# Patient Record
Sex: Male | Born: 1945
Health system: Southern US, Community
[De-identification: ages and names within clinical notes are randomized; demographics above are authoritative.]

## PROBLEM LIST (undated history)

## (undated) DIAGNOSIS — E039 Hypothyroidism, unspecified: Secondary | ICD-10-CM

## (undated) DIAGNOSIS — R011 Cardiac murmur, unspecified: Secondary | ICD-10-CM

## (undated) DIAGNOSIS — E782 Mixed hyperlipidemia: Secondary | ICD-10-CM

## (undated) DIAGNOSIS — T148XXA Other injury of unspecified body region, initial encounter: Secondary | ICD-10-CM

## (undated) DIAGNOSIS — I10 Essential (primary) hypertension: Secondary | ICD-10-CM

## (undated) DIAGNOSIS — M1990 Unspecified osteoarthritis, unspecified site: Secondary | ICD-10-CM

## (undated) HISTORY — PX: KNEE ARTHROSCOPY: SUR90

## (undated) HISTORY — DX: Essential (primary) hypertension: I10

## (undated) HISTORY — PX: CERVICAL DISC SURGERY: SHX588

## (undated) HISTORY — DX: Hypothyroidism, unspecified: E03.9

## (undated) HISTORY — DX: Mixed hyperlipidemia: E78.2

## (undated) HISTORY — PX: APPENDECTOMY: SHX54

---

## 1973-03-25 HISTORY — PX: KNEE SURGERY: SHX244

## 2002-04-08 ENCOUNTER — Ambulatory Visit (HOSPITAL_COMMUNITY): Admission: RE | Admit: 2002-04-08 | Discharge: 2002-04-08 | Payer: Self-pay | Admitting: Family Medicine

## 2002-04-08 ENCOUNTER — Encounter: Payer: Self-pay | Admitting: Family Medicine

## 2003-03-26 HISTORY — PX: OLECRANON BURSECTOMY: SHX2097

## 2003-12-12 ENCOUNTER — Encounter: Payer: Self-pay | Admitting: Orthopedic Surgery

## 2004-03-07 ENCOUNTER — Ambulatory Visit: Payer: Self-pay | Admitting: Orthopedic Surgery

## 2004-04-06 ENCOUNTER — Ambulatory Visit: Payer: Self-pay | Admitting: Orthopedic Surgery

## 2004-04-06 ENCOUNTER — Ambulatory Visit (HOSPITAL_COMMUNITY): Admission: RE | Admit: 2004-04-06 | Discharge: 2004-04-06 | Payer: Self-pay | Admitting: Orthopedic Surgery

## 2004-04-11 ENCOUNTER — Ambulatory Visit: Payer: Self-pay | Admitting: Orthopedic Surgery

## 2004-04-18 ENCOUNTER — Ambulatory Visit: Payer: Self-pay | Admitting: Orthopedic Surgery

## 2005-10-14 ENCOUNTER — Ambulatory Visit: Payer: Self-pay | Admitting: Orthopedic Surgery

## 2006-06-04 ENCOUNTER — Ambulatory Visit: Payer: Self-pay | Admitting: Orthopedic Surgery

## 2006-06-11 ENCOUNTER — Ambulatory Visit: Payer: Self-pay | Admitting: Orthopedic Surgery

## 2006-07-10 ENCOUNTER — Ambulatory Visit: Payer: Self-pay | Admitting: Orthopedic Surgery

## 2006-08-25 ENCOUNTER — Ambulatory Visit: Payer: Self-pay | Admitting: Orthopedic Surgery

## 2006-09-02 ENCOUNTER — Inpatient Hospital Stay (HOSPITAL_COMMUNITY): Admission: RE | Admit: 2006-09-02 | Discharge: 2006-09-05 | Payer: Self-pay | Admitting: Orthopedic Surgery

## 2006-09-02 ENCOUNTER — Ambulatory Visit: Payer: Self-pay | Admitting: Orthopedic Surgery

## 2006-09-02 ENCOUNTER — Encounter: Payer: Self-pay | Admitting: Orthopedic Surgery

## 2006-09-02 HISTORY — PX: TOTAL KNEE ARTHROPLASTY: SHX125

## 2006-09-16 ENCOUNTER — Ambulatory Visit: Payer: Self-pay | Admitting: Orthopedic Surgery

## 2006-09-18 ENCOUNTER — Encounter (HOSPITAL_COMMUNITY): Admission: RE | Admit: 2006-09-18 | Discharge: 2006-10-18 | Payer: Self-pay | Admitting: Orthopedic Surgery

## 2006-09-23 ENCOUNTER — Ambulatory Visit: Payer: Self-pay | Admitting: Orthopedic Surgery

## 2006-10-14 ENCOUNTER — Ambulatory Visit: Payer: Self-pay | Admitting: Orthopedic Surgery

## 2006-10-20 ENCOUNTER — Encounter: Admission: RE | Admit: 2006-10-20 | Discharge: 2006-11-19 | Payer: Self-pay | Admitting: Orthopedic Surgery

## 2006-11-25 ENCOUNTER — Ambulatory Visit: Payer: Self-pay | Admitting: Orthopedic Surgery

## 2007-01-19 ENCOUNTER — Ambulatory Visit: Payer: Self-pay | Admitting: Orthopedic Surgery

## 2007-01-19 DIAGNOSIS — M76899 Other specified enthesopathies of unspecified lower limb, excluding foot: Secondary | ICD-10-CM | POA: Insufficient documentation

## 2007-02-26 ENCOUNTER — Ambulatory Visit: Payer: Self-pay | Admitting: Orthopedic Surgery

## 2007-02-26 DIAGNOSIS — M1710 Unilateral primary osteoarthritis, unspecified knee: Secondary | ICD-10-CM

## 2007-10-13 ENCOUNTER — Encounter: Payer: Self-pay | Admitting: Orthopedic Surgery

## 2007-10-14 ENCOUNTER — Ambulatory Visit: Payer: Self-pay | Admitting: Orthopedic Surgery

## 2007-10-14 DIAGNOSIS — M2350 Chronic instability of knee, unspecified knee: Secondary | ICD-10-CM

## 2008-01-07 ENCOUNTER — Encounter: Payer: Self-pay | Admitting: Orthopedic Surgery

## 2008-09-29 ENCOUNTER — Ambulatory Visit: Payer: Self-pay | Admitting: Orthopedic Surgery

## 2008-09-29 DIAGNOSIS — Z96659 Presence of unspecified artificial knee joint: Secondary | ICD-10-CM

## 2008-12-27 ENCOUNTER — Encounter: Payer: Self-pay | Admitting: Orthopedic Surgery

## 2010-04-03 ENCOUNTER — Encounter: Payer: Self-pay | Admitting: Orthopedic Surgery

## 2010-04-04 ENCOUNTER — Ambulatory Visit
Admission: RE | Admit: 2010-04-04 | Discharge: 2010-04-04 | Payer: Self-pay | Source: Home / Self Care | Attending: Orthopedic Surgery | Admitting: Orthopedic Surgery

## 2010-04-04 DIAGNOSIS — IMO0002 Reserved for concepts with insufficient information to code with codable children: Secondary | ICD-10-CM | POA: Insufficient documentation

## 2010-04-05 ENCOUNTER — Encounter: Payer: Self-pay | Admitting: Orthopedic Surgery

## 2010-04-05 ENCOUNTER — Encounter (INDEPENDENT_AMBULATORY_CARE_PROVIDER_SITE_OTHER): Payer: Self-pay | Admitting: *Deleted

## 2010-04-06 ENCOUNTER — Ambulatory Visit (HOSPITAL_COMMUNITY)
Admission: RE | Admit: 2010-04-06 | Discharge: 2010-04-06 | Payer: Self-pay | Source: Home / Self Care | Attending: Orthopedic Surgery | Admitting: Orthopedic Surgery

## 2010-04-09 ENCOUNTER — Encounter: Payer: Self-pay | Admitting: Orthopedic Surgery

## 2010-04-09 ENCOUNTER — Ambulatory Visit
Admission: RE | Admit: 2010-04-09 | Discharge: 2010-04-09 | Payer: Self-pay | Source: Home / Self Care | Attending: Orthopedic Surgery | Admitting: Orthopedic Surgery

## 2010-04-09 DIAGNOSIS — M2340 Loose body in knee, unspecified knee: Secondary | ICD-10-CM | POA: Insufficient documentation

## 2010-04-09 DIAGNOSIS — M9320 Osteochondritis dissecans of unspecified site: Secondary | ICD-10-CM | POA: Insufficient documentation

## 2010-04-09 DIAGNOSIS — Z9889 Other specified postprocedural states: Secondary | ICD-10-CM | POA: Insufficient documentation

## 2010-04-09 LAB — BASIC METABOLIC PANEL
BUN: 16 mg/dL (ref 6–23)
CO2: 28 mEq/L (ref 19–32)
Calcium: 9 mg/dL (ref 8.4–10.5)
Chloride: 103 mEq/L (ref 96–112)
Creatinine, Ser: 1.15 mg/dL (ref 0.4–1.5)
GFR calc Af Amer: >60 mL/min (ref >60)
GFR calc non Af Amer: >60 mL/min (ref >60)
Glucose, Bld: 98 mg/dL (ref 70–99)
Potassium: 4.2 mEq/L (ref 3.5–5.1)
Sodium: 137 mEq/L (ref 135–145)

## 2010-04-09 LAB — SURGICAL PCR SCREEN
MRSA, PCR: NEGATIVE
Staphylococcus aureus: POSITIVE — AB

## 2010-04-09 LAB — HEMOGLOBIN AND HEMATOCRIT, BLOOD
HCT: 39.1 % (ref 39.0–52.0)
Hemoglobin: 13.5 g/dL (ref 13.0–17.0)

## 2010-04-11 ENCOUNTER — Encounter: Payer: Self-pay | Admitting: Orthopedic Surgery

## 2010-04-12 ENCOUNTER — Ambulatory Visit
Admission: RE | Admit: 2010-04-12 | Discharge: 2010-04-12 | Payer: Self-pay | Source: Home / Self Care | Attending: Orthopedic Surgery | Admitting: Orthopedic Surgery

## 2010-04-19 NOTE — Op Note (Addendum)
  NAME:  Ryan [name], Ryan [name]                ACCOUNT NO.:  [id number]  MEDICAL RECORD NO.:  [id number]          PATIENT TYPE:  AMB  LOCATION:  DAY                           FACILITY:  [facility]  PHYSICIAN:  Vickki Hearing, M.D.DATE OF BIRTH:  29-Nov-1945  DATE OF PROCEDURE:  04/06/2010 DATE OF DISCHARGE:  04/06/2010                              OPERATIVE REPORT   PREOPERATIVE DIAGNOSIS:  Torn medial meniscus of the right knee.  POSTOPERATIVE DIAGNOSES: 1. Torn medial meniscus of the right knee. 2. Osteochondral fracture. 3. Osteoarthritis of the right knee.  PROCEDURES: 1. Arthroscopy, right knee. 2. Removal of loose body. 3. Partial medial meniscectomy. 4. Drilling/microfracture medial femoral condyle.  SURGEON:  Vickki Hearing, MD  ASSISTANTS:  None.  ANESTHESIA:  Spinal.  OPERATIVE FINDINGS:  Large loose body, large osteochondral defect from the loose body in the medial femoral condyle, complex tear, degenerative medial meniscus, and moderate arthritis.  DETAILS OF PROCEDURE:  After site marking, chart update, and antibiotics, the patient was taken to the surgery for spinal anesthetic. His right leg was prepped and draped in sterile technique.  Standard arthroscopy portals were established.  Diagnostic arthroscopy was done by touring throughout the knee.  In the notch area, there was a large loose body and the loose body was found to be coming from the medial femoral condyle on the weightbearing surface.  There was also a large tear of the posterior horn of the medial meniscus, and there was a mild-to-moderate amount of arthritis throughout the knee with associated synovitis.  The meniscal tear was resected with a shaver, duckbill forceps, and balanced with an ArthroCare wand.  Meniscal fragments were removed with a shaver.  After successfully creating a stable rim to the posterior horn of the medial meniscus, a separate portal was used to microfracture the  medial femoral condyle.  The microfracture was performed with chondral picks, however, after this, no bleeding bed was noted, so a Steinmann pin was used to drill several holes into the defect.  The bleeding bed was then noted.  The knee was irrigated and closed with sutures using 3-0 nylon, 1 in each portal.  A 60 mL Marcaine injected into the joint.  The patient was taken to the recovery room in stable condition.     Vickki Hearing, M.D.     SEH/MEDQ  D:  04/09/2010  T:  04/10/2010  Job:  [id number]  Electronically Signed by Fuller Canada M.D. on 04/19/2010 12:45:14 PM

## 2010-04-26 NOTE — Assessment & Plan Note (Signed)
Summary: f/u post op 1 rt knee surg 04/06/10/wkj   Visit Type:  post op Primary Provider:  Dr. Sherwood Gambler  CC:  post op right knee.  History of Present Illness:   65 year old male status post arthroscopy RIGHT knee partial medial meniscectomy and microfracture medial femoral condyle for osteochondral fracture and torn medial meniscus with underlying osteoarthritis, loose body   DOS 04-06-10.  Medciations: Ibuprofen 800 mg.  Complaints: He has no problems.  His knee looks great. He has some bruising on the medial side. He has flexion 120. He is ambulating with the brace on. No support.    Allergies: 1)  ! * Codeine 2)  ! Morphine   Impression & Recommendations:  Problem # 1:  TEAR MEDIAL MENISCUS (ICD-836.0)  he is doing very well. We would recommend that he go ahead and continue his exercise program wear his brace. We'll decide on therapy. When I see him on Thursday to take the stitches out  Orders: Post-Op Check [phone])  Problem # 2:  DEGENERATIVE JOINT DISEASE, KNEE (ICD-715.96)  Orders: Post-Op Check (60454)  Problem # 3:  OSTEOCHONDRITIS DESSICANS (ICD-732.7)  osteochondral fracture  Orders: Post-Op Check (09811)  Problem # 4:  LOOSE BODY-KNEE (ICD-717.6)  Orders: Post-Op Check (91478)  Patient Instructions: 1)  take sutures out thurs  2)  wear brace 12 weeks   Orders Added: 1)  Post-Op Check [29562]

## 2010-04-26 NOTE — Miscellaneous (Signed)
Summary: brace was making medial knee sore  Clinical Lists Changes      Patient came in today said the e hinge brace was making his medial knee really sore, he had swelling, I advised to ice, use ace wrap, pain med and Ibuprofen until seen tomorrow, he walked alot yesterday and had alot of pain last night, brace made the knee pain worse

## 2010-04-26 NOTE — Miscellaneous (Signed)
Summary: No pre-cert required for out-patient procedure  Clinical Lists Changes  Re: out-patient surgery scheduled for 04/06/10 at [facility] RT knee arthroscoopy, no pre-cert required per Medicare guidelines. No pre-cert required for 2ndary BCBS per secondary to Medicare guidelines.

## 2010-04-26 NOTE — Letter (Signed)
Summary: History form  History form   Imported By: Jacklynn Ganong 04/09/2010 14:09:41  _____________________________________________________________________  External Attachment:    Type:   Image     Comment:   External Document

## 2010-04-26 NOTE — Medication Information (Signed)
Summary: Tax adviser   Imported By: Cammie Sickle 04/09/2010 20:02:27  _____________________________________________________________________  External Attachment:    Type:   Image     Comment:   External Document

## 2010-04-26 NOTE — Assessment & Plan Note (Signed)
Summary: right knee pain/needs xray/frs   Vital Signs:  Patient profile:   65 year old male Height:      69 inches Weight:      179 pounds Pulse rate:   64 / minute Resp:     18 per minute  Vitals Entered By: Fuller Canada MD (April 04, 2010 4:09 PM)  Visit Type:  new problem Primary Provider:  Dr. Sherwood Gambler  CC:  right knee pain.  History of Present Illness: I saw Ryan [name] in the office today for a  new problem visit.  He is a 65 years old man with the complaint of:  right knee pain.  Xrays today.  No meds.  4 weeks ago, the patient noticed pain after twisting injury that sent to the floor. Since that time has had a 2nd episode of giving out with burning and 7/10. Constant pain over the medial aspect of his RIGHT knee. He has catching phenomenon as well.  He presents for evaluation  Allergies: 1)  ! * Codeine 2)  ! Morphine  Past History:  Past Surgical History: Last updated: 09/16/2006 CERVICAL DISC Appendectomy Total Knee Arthroplasty-LEFT 09/02/2006  Family History: Last updated: 04/04/2010 FH of Cancer:   Social History: Last updated: 04/04/2010 Patient is married.  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  retired smokes 1 ppd no alcohol 4 cups of coffee per day 10th grade ed  Review of Systems Musculoskeletal:  Complains of joint pain; denies swelling, instability, stiffness, redness, heat, and muscle pain.  The review of systems is negative for Constitutional, Cardiovascular, Respiratory, Gastrointestinal, Genitourinary, Neurologic, Endocrine, Psychiatric, Skin, HEENT, Immunology, and Hemoatologic.  Physical Exam  Skin:  intact without lesions or rashes Cervical Nodes:  no significant adenopathy Psych:  alert and cooperative; normal mood and affect; normal attention span and concentration   Knee  Exam  General:    Well-developed, well-nourished, normal body habitus; no deformities, normal grooming.  Gait:    he has a very noticeable limp on the RIGHT side, favoring the RIGHT leg  Skin:    Intact, no scars, lesions, rashes, cafe au lait spots, or bruising.    Inspection:    swelling with joint effusion, RIGHT knee  Palpation:    tenderness R-medial joint line.    Vascular:    There was no swelling or varicose veins. The pulses and temperature are normal. There was no edema or tenderness.  Sensory:    Gross coordination and sensation were normal.    Motor:    Motor strength 5/5 bilaterally for quadriceps, hamstrings, ankle dorsiflexion, and ankle plantar flexion.    Reflexes:    Normal and symmetric patellar and Achilles reflexes bilaterally.    Knee Exam:    Right:    Inspection:  Abnormal    Palpation:  Abnormal    Stability:  stable    Tenderness:  medial joint line    full range of motion  He has a positive McMurray sign. Positive screw home test.    Left:    Inspection:  Normal    Palpation:  Normal    Stability:  stable    range of motion is normal   Impression & Recommendations:  Problem # 1:  TEAR MEDIAL MENISCUS (ICD-836.0) Assessment New  Separate and Identifiable X-Ray report      x-rays ap lateral  and patellofemoral right knee   FINDINGS: medial joint space narrowing moderate with mild varus   IMPRESSION: abnormal knee with medial joint space narrowing    PLAN: SARK, MEDIAL MENISECTOMY   Orders: Est. Patient Level IV (16109) Knee x-ray,  3 views (60454)  Patient Instructions: 1)  DOS 04/06/10 2)  Preop tomorrow 1/64/11 at 3:15pm [place] [facility], take packet with you. 3)  Post op 1 in our office on 04/09/09   Orders Added: 1)  Est. Patient Level IV [09811] 2)  Knee x-ray,  3 views [73562]

## 2010-04-26 NOTE — Assessment & Plan Note (Signed)
Summary: POST OP 2/SUT REM/RT KNEE SURG 04/06/10/MEDICARE,BCBS/CAF   Visit Type:  Follow-up Primary Provider:  Dr. Sherwood Gambler  CC:  post op 2 rt knee.  History of Present Illness:   65 year old male status post arthroscopy RIGHT knee partial medial meniscectomy and microfracture medial femoral condyle for osteochondral fracture and torn medial meniscus with underlying osteoarthritis, loose body   DOS 04-06-10.  POD 6  Medications: Ibuprofen 800 mg.  much improved, less swelling, he is walking without assist   Stitches out today.  Pain level is 3 today    Allergies: 1)  ! * Codeine 2)  ! Morphine  Physical Exam  Additional Exam:  knee flexion 120, full extension. He does have a lot of bruising and swelling, but we expect this. A small joint effusion, most likely, blood from the drilling.     Impression & Recommendations:  Problem # 1:  ARTHROSCOPY, RIGHT KNEE, HX OF (ICD-V45.89)  Orders: Post-Op Check (16109)  Problem # 2:  LOOSE BODY-KNEE (ICD-717.6)  Orders: Post-Op Check (60454)  Problem # 3:  OSTEOCHONDRITIS DESSICANS (ICD-732.7)  Orders: Post-Op Check (09811)  Problem # 4:  TEAR MEDIAL MENISCUS (ICD-836.0)  Orders: Post-Op Check (91478)  Patient Instructions: 1)  Please schedule a follow-up appointment in 3 weeks.   Orders Added: 1)  Post-Op Check [29562]

## 2010-04-26 NOTE — Letter (Signed)
Summary: surgery order RT knee sched [id number]  surgery order RT knee sched [id number]   Imported By: Cammie Sickle 04/04/2010 21:08:57  _____________________________________________________________________  External Attachment:    Type:   Image     Comment:   External Document

## 2010-05-08 ENCOUNTER — Ambulatory Visit (INDEPENDENT_AMBULATORY_CARE_PROVIDER_SITE_OTHER): Payer: Medicare Other | Admitting: Orthopedic Surgery

## 2010-05-08 ENCOUNTER — Encounter: Payer: Self-pay | Admitting: Orthopedic Surgery

## 2010-05-08 DIAGNOSIS — M9320 Osteochondritis dissecans of unspecified site: Secondary | ICD-10-CM

## 2010-05-08 DIAGNOSIS — Z9889 Other specified postprocedural states: Secondary | ICD-10-CM

## 2010-05-08 DIAGNOSIS — M2340 Loose body in knee, unspecified knee: Secondary | ICD-10-CM

## 2010-05-16 NOTE — Assessment & Plan Note (Signed)
Summary: 3 wk reck rt knee/post op/surg 04/06/10/mcr/caf   Visit Type:  Follow-up Primary Provider:  Dr. Sherwood Gambler  CC:  post op knee.  History of Present Illness:   65 year old male status post arthroscopy RIGHT knee partial medial meniscectomy and microfracture medial femoral condyle for osteochondral fracture and torn medial meniscus with underlying osteoarthritis, loose body  DOS 04-06-10.  POD 4 weeks and 4 days.  Medications: Ibuprofen 800 mg as needed, twice per day at the most, some days he does not take any.  Today is 3 week recheck after HEP.  The economy hinge brace is making the medial knee hurt.  Pain level is 7 today.  Has stiffness, soreness anterior knee, no injury.  He has been weight too active on this microfracture. He couldn't wear the hinge economy brace.  Using complaining of 7/10 pain and some stiffness with swelling          Allergies: 1)  ! * Codeine 2)  ! Morphine  Physical Exam  Additional Exam:  RIGHT knee joint effusion noted  Its small. He has some medial condylar  tenderness and some tenderness at the joint line   ROM = 120 degrees   stable     Impression & Recommendations:  Problem # 1:  ARTHROSCOPY, RIGHT KNEE, HX OF (ICD-V45.89)  Orders: Post-Op Check (91478)  Problem # 2:  LOOSE BODY-KNEE (ICD-717.6)  Orders: Post-Op Check (29562)  Problem # 3:  OSTEOCHONDRITIS DESSICANS (ICD-732.7)  Orders: Post-Op Check (13086)  Patient Instructions: 1)  wear sleeve  2)  ice every night if swollen 3)  continue IBUPROFEN as needed  4)  return in 1 month    Orders Added: 1)  Post-Op Check [57846]

## 2010-06-06 ENCOUNTER — Ambulatory Visit (INDEPENDENT_AMBULATORY_CARE_PROVIDER_SITE_OTHER): Payer: Medicare Other | Admitting: Orthopedic Surgery

## 2010-06-06 ENCOUNTER — Ambulatory Visit: Payer: Medicare Other | Admitting: Orthopedic Surgery

## 2010-06-06 ENCOUNTER — Encounter: Payer: Self-pay | Admitting: Orthopedic Surgery

## 2010-06-06 DIAGNOSIS — M2340 Loose body in knee, unspecified knee: Secondary | ICD-10-CM

## 2010-06-06 DIAGNOSIS — M25469 Effusion, unspecified knee: Secondary | ICD-10-CM | POA: Insufficient documentation

## 2010-06-06 DIAGNOSIS — Z9889 Other specified postprocedural states: Secondary | ICD-10-CM

## 2010-06-06 DIAGNOSIS — M9320 Osteochondritis dissecans of unspecified site: Secondary | ICD-10-CM

## 2010-06-06 DIAGNOSIS — IMO0002 Reserved for concepts with insufficient information to code with codable children: Secondary | ICD-10-CM

## 2010-06-12 NOTE — Assessment & Plan Note (Signed)
Summary: 1 M RE-CK/RT KNEE/POST OP/SURG 04/06/10/MEDICARE/CAF   Visit Type:  post op Primary Provider:  Dr. Sherwood Gambler  CC:  right knee.  History of Present Illness:  65 year old male status post arthroscopy RIGHT knee partial medial meniscectomy and microfracture medial femoral condyle for osteochondral fracture and torn medial meniscus with underlying osteoarthritis, loose body  DOS 04-06-10.  Medications: Ibuprofen 800 mg as needed, twice per day at the most, some days he does not take any.  Complaints: he says that his knee bothers him going up and down steps, it feels spongy.  This is postoperative number 9  He still has swelling and is still limping.  However he's been somewhat noncompliant with the microfracture protocol.  His knee has mild varus alignment.  He has a large joint effusion today.  He has tenderness on the medial compartment.  I aspirated his knee approximately 20 cc of yellow fluid.  I recommended that he rest his knee stay off of it for the next 5 days use ice I put him in a hinged wrap around brace and he will come back in 4 weeks         Allergies: 1)  ! * Codeine 2)  ! Morphine   Impression & Recommendations:  Problem # 1:  ARTHROSCOPY, RIGHT KNEE, HX OF (ICD-V45.89)  consent for RIGHT knee aspiration/injection  Lateral approach used sterile technique after chloride for anesthesia 18-gauge needle aspirated 20 cc of yellow fluid  No complications sterile bandage applied  Verbal consent was obtained. The RIGHT knee was prepped with alcohol and ethyl chloride. 1 cc of depomedrol 40mg /cc and 4 cc of lidocaine 1% was injected. there were no complications.  Orders: Post-Op Check [phone])  Problem # 2:  LOOSE BODY-KNEE (ICD-717.6)  Orders: Post-Op Check (98119)  Problem # 3:  OSTEOCHONDRITIS DESSICANS (ICD-732.7)  Orders: Post-Op Check (14782)  Other Orders: Joint Aspirate / Injection, Large (20610) Depo- Medrol 40mg  (J1030)  Patient  Instructions: 1)  You have received an injection of cortisone today. You may experience increased pain at the injection site. Apply ice pack to the area for 20 minutes every 2 hours and take 2 xtra strength tylenol every 8 hours. This increased pain will usually resolve in 24 hours. The injection will take effect in 3-10 days.  2)  5 days rest  3)  brace  4)  f/u thurs April 12    Orders Added: 1)  Post-Op Check [99024] 2)  Joint Aspirate / Injection, Large [20610] 3)  Depo- Medrol 40mg  [J1030]

## 2010-07-05 ENCOUNTER — Encounter: Payer: Self-pay | Admitting: Orthopedic Surgery

## 2010-07-05 ENCOUNTER — Ambulatory Visit: Payer: Medicare Other | Admitting: Orthopedic Surgery

## 2010-07-12 ENCOUNTER — Ambulatory Visit (INDEPENDENT_AMBULATORY_CARE_PROVIDER_SITE_OTHER): Payer: Medicare Other | Admitting: Orthopedic Surgery

## 2010-07-12 DIAGNOSIS — M2340 Loose body in knee, unspecified knee: Secondary | ICD-10-CM

## 2010-07-12 DIAGNOSIS — M1710 Unilateral primary osteoarthritis, unspecified knee: Secondary | ICD-10-CM

## 2010-07-12 NOTE — Progress Notes (Signed)
Postop visit Mrs. Last visit he forgot his appointment.  Procedure arthroscopy, RIGHT knee, partial medial meniscectomy and microfracture, medial femoral condyle for osteochondral fracture, and torn medial meniscus, and loose body.  Date of surgery January 13  Patient reports his knee is doing well having no difficulties.  Exam shows full extension of the knee with adequate knee flexion, and no effusion.  Impression doing well stable followup as needed for RIGHT knee.  Followup in June for LEFT knee. X-ray annual followup

## 2010-08-07 NOTE — Op Note (Signed)
NAME:  Ryan [name], Ryan [name]                ACCOUNT NO.:  [id number]   MEDICAL RECORD NO.:  [id number]          PATIENT TYPE:  INP   LOCATION:  A331                          FACILITY:  [facility]   PHYSICIAN:  Vickki Hearing, M.D.DATE OF BIRTH:  02/19/1946   DATE OF PROCEDURE:  09/02/2006  DATE OF DISCHARGE:                               OPERATIVE REPORT   HISTORY:  A 65 year old male who had an injury to his left knee several  years ago, perhaps 25.  He had surgery, developed staph infection, had  incision, drainage, antibiotics and over the years has developed medial  arthrosis which has become painful and disabling for him.  He had  conservative treatment, failed that and presented for knee replacement.   PREOPERATIVE DIAGNOSIS:  Osteoarthritis left knee.   POSTOPERATIVE DIAGNOSIS:  Osteoarthritis left knee.   PROCEDURE:  Left total knee with intraoperative cultures.   IMPLANTS USED:  Stryker Triathlon size 5 PS femur, size 6 tibial  baseplate, a size 6, 11 mm tibial insert, size 29 offset dome 9 mm thick  patella.  Tobramycin-laden, Simplex-P cement.   We inserted a pain pump catheter in the subcu tissue.  We injected 30 mL  of Sensorcaine in the soft tissues before closure.   SURGEON:  Fuller Canada, M.D.   ASSISTED BY:  [facility] and [facility].   ANESTHETIC:  Spinal.   OPERATIVE FINDINGS:  Severe deformity and degeneration of the medial  compartment, both femoral and tibial sides, patellofemoral disease, mild  lateral compartment disease, remnant of medial meniscus present.  ACL  appeared degenerated and for all intent and purposes, nonfunctional.  PCL was intact.   PROCEDURE:  The patient was identified and the preop area.  I marked his  left knee for the surgical site as he had done and updated his history  and physical.  He was taken to the operating room for spinal anesthetic  and his antibiotic was started.  After sterile prep and drape of his  left knee, we  completed the time-out procedure.  We confirmed that as a  left total knee replacement, [name], and antibiotic, time started,  etc.   A straight incision was made in the previously noted incision.  This was  extended proximally and distally to normal tissue and then full  thickness medial and lateral flaps were established.  A medial  arthrotomy was performed.  Patella was everted meniscal remnant was  removed.  ACL, PCL removed.  Lateral meniscus removed.  Medial  osteophytes were removed.  Patellofemoral osteophytes removed.   The tibia was cut first.  It was set for neutral cut, 0 degrees slope  and the Triathlon guide was set for 15 mm referencing from the tibial  spine nadir. This removed an symmetric cut as noted on preoperative  planning films.  We then measured the tibia to be a size 6 or 7.   Intramedullary drill was introduced into the femoral canal followed by a  guide rod and distal cutting was set for a 10 mm cut.  We made the 10 mm  cut, checked  the extension gap with a block.  I thought it was too  tight.  We took an additional 2 mm.  We made the four femoral cuts,  checked the flexion gap, found it to be good with an 11, as well as the  extension gap to be good with 11.  We made the box cut, did a femoral  and tibial trial, marked tibial rotation, punched the tibia and cut the  patella from a 24 to a 15.   We then irrigated the joint and bone surfaces, cleaned them, dried them,  cemented our implants in place.  Trialed with a 9 and an 11.  The 11  gave the best fit in terms of motion, collateral ligament stability in  flexion and extension.  On the table, we were able to obtain full  extension and 130 degrees of knee flexion.   The size 6, 11 mm polyethylene tray was inserted, secured, checked.  All  cement and that was in excess was removed.  We injected 30 mL of  Sensorcaine in the soft tissue, closed with interrupted #1 Bralon  sutures and 2-0 and 0 Monocryl  sutures over a pain pump which was placed  in the subcu tissue.  Staples were used to close the skin.  Sterile  dressing, Ace bandage from toe to groin and cryo cuff were applied.  The  patient was taken to the recovery room in stable condition where he had  x-rays which showed excellent alignment and position of the implants.  He was placed in a knee immobilizer and eventually taken up to the  floor.      Vickki Hearing, M.D.  Electronically Signed     SEH/MEDQ  D:  09/02/2006  T:  09/03/2006  Job:  [id number]

## 2010-08-07 NOTE — H&P (Signed)
NAME:  Ryan [name], Ryan [name]                ACCOUNT NO.:  [id number]   MEDICAL RECORD NO.:  [id number]           PATIENT TYPE:  AMB   LOCATION:                                FACILITY:  [facility]   PHYSICIAN:  Vickki Hearing, M.D.DATE OF BIRTH:  1945/12/17   DATE OF ADMISSION:  DATE OF DISCHARGE:  LH                              HISTORY & PHYSICAL   CHIEF COMPLAINT:  Left knee pain.   HISTORY:  This is a 65 year old male status post surgical repair of  right knee after an injury approximately 30 years ago.  After surgery,  the condition was complicated by staph infection.  This was treated with  the incision, drainage and antibiotics.  He recovered and did well until  the last year or so.  He presented in March 2008 complaining of pain and  large joint effusion and swelling. I treated him with aspiration and  injection, culture, fluid analysis and cell count.  The results of this  analysis came back inflammatory cells.  His x-ray showed severe varus  gonarthrosis.  I recommended Indocin and a hinged knee brace and this  initially helped his symptoms but they eventually worsened over time and  we discussed total knee replacement.   He was brought back on Monday, August 25, 2006, with his wife and the  informed consent process was completed.  He had previously been given a  total knee pamphlet.  He and his wife reviewed it and confirmed that.  We had a question and answer session.  Amongst various things we  discussed were antibiotic cement, its use, its complications, risk  factors, we discussed the risks and benefits of using it versus not  using it and I strongly advised that we use it.  We discussed other  things such as infection, DVT, etc.   He consented for the left total knee replacement to be done September 02, 2006, with a follow-up scheduled for September 16, 2006.  They plan to take  the patient home.   It should be noted that he has had severe narcotic reactions with  codeine and  morphine causing him severe flushing and  respiratory  depression.  This has happened on two separate occasions including oral  hydrocodone.  So most likely we will have to use Darvocet p.o., Demerol  or Stadol IV postoperatively.   PREVIOUS SURGICAL HISTORY:  1. A left elbow surgery.  2. Left knee surgery.  3. Cervical disk procedure.  4. Appendectomy.  5. I removed the mass from his left elbow in December 2005.   FAMILY HISTORY:  He has a family history of cancer.   MEDICATIONS:  Currently takes no major medications.   REVIEW OF SYSTEMS:  Negative.   PHYSICAL EXAMINATION:  GENERAL APPEARANCE:  He is well-groomed,  excellent hygiene, no obvious deformities, he developed normally.  Nutritional status is excellent.  His frame his ectomorphic.  HEENT:  He has an upper denture.  His mouth and oral cavity is otherwise  clear.  Head and neck showed no abnormalities.  Pulses are good in the  carotids.  He has no lymphadenopathy.  CHEST:  His chest was clear.  CARDIAC:  Heart rate and rhythm were normal.  ABDOMEN:  His abdomen was soft with no mass for bruit.  EXTREMITIES:  His upper extremity showed good strength without  deformity.  No joint contracture.  Scar noted over the elbow.  With the  exception of the left elbow which shows some lack of extension  approximately 15 degrees.   Left knee and lower extremity exam shows 3-120 in terms of motion.  There is a large medial scar which we will incorporate into the surgical  incision.  There is no instability to the knee.  There is varus  malalignment.  NEUROVASCULAR:  Exam is normal.  Reflexes are normal.  Muscle strength  and tone is normal as well.   IMPRESSION:  Osteoarthritis left knee.   PLAN:  Left total knee arthroplasty plus or minus computer assistance  with antibiotic cement.   DIAGNOSIS:  Osteoarthritis left knee.      Vickki Hearing, M.D.  Electronically Signed     SEH/MEDQ  D:  08/25/2006  T:   08/25/2006  Job:  [id number]

## 2010-08-07 NOTE — Group Therapy Note (Signed)
NAME:  Ryan [name], CLOCK                ACCOUNT NO.:  [id number]   MEDICAL RECORD NO.:  [id number]          PATIENT TYPE:  INP   LOCATION:  A331                          FACILITY:  [facility]   PHYSICIAN:  Vickki Hearing, M.D.DATE OF BIRTH:  [birth year]   DATE OF PROCEDURE:  09/03/2006  DATE OF DISCHARGE:                                 PROGRESS NOTE   The patient is postop day 20from left total knee arthroplasty for  osteoarthritis.  He is afebrile.  His vital signs are stable.  His Ins  and outs net balance of 441 positive.  He is finishing up his IV Ancef  per protocol.  His hemoglobin is 11.6.  His PT/INR after 10 mg of  Coumadin is 13.7 and 1.  Will give him 5 mg today.  Glucose 122, other b-  met values are normal.  His pain level is graded at zero.  He will start  his rehab today.      Vickki Hearing, M.D.  Electronically Signed     SEH/MEDQ  D:  09/03/2006  T:  09/03/2006  Job:  [id number]

## 2010-08-07 NOTE — Discharge Summary (Signed)
NAME:  TREVYON, SWOR NO.:  [id number]   MEDICAL RECORD NO.:  [id number]          PATIENT TYPE:  INP   LOCATION:  A331                          FACILITY:  [facility]   PHYSICIAN:  Vickki Hearing, M.D.DATE OF BIRTH:  Nov 14, 1945   DATE OF ADMISSION:  09/02/2006  DATE OF DISCHARGE:  06/13/2008LH                               DISCHARGE SUMMARY   ADMITTING PHYSICIAN:  Vickki Hearing, M.D.   DISCHARGE PHYSICIAN:  Vickki Hearing, M.D.   ADMITTING DIAGNOSIS:  Osteoarthritis left knee.   DISCHARGE DIAGNOSIS:  Osteoarthritis left knee.   OPERATIVE PROCEDURE:  Left total knee arthroplasty.   DATE OF SURGERY:  09/02/2006.   OPERATIVE FINDINGS:  Severe osteoarthritis left knee.   HISTORY:  The patient is 65 years old.  He injured his knee 30 years  ago, had surgical repair and developed infection with staph.  This was  treated with incision drainage and he did well.  Over the last 10 years  or so he had increasing pain in his knee and finally came to end-stage  arthritis with recurrent effusions and swelling.  We aspirated the knee,  sent it for culture fluid analysis and cell count.  Cell count and  analysis came back inflammatory cells with no infection on culture.  His  x-ray showed varus gonarthrosis.  He was treated with Indocin a hinged  knee brace and over time this failed to control his symptoms.  He  presented for knee replacement surgery.   HOSPITAL COURSE:  The patient admitted on 09/02/2006, had uncomplicated  total knee replacement with a Triathlon posterior stabilized implant and  an X3 tibial bearing size 6, thickness 11 mm.  He had a size 29, 9-mm  patella, size 6 tibial base plate, size 5 posterior stabilized femoral  implant.  He tolerated the procedure well.  No complications.   He was doing well in therapy and was allowed to be discharged home.   His discharging hemoglobin was 10.8.  His INR was 2.6.   DISCHARGE MEDICATIONS:  1.  Coumadin 5 mg daily.  2. Colace 100 mg twice daily.  3. Feosol one twice daily.  4. Darvocet-N100 one q 4 p.r.n. for pain.   We arranged the staples to come out on postop day 10.  He was arranged  to have physical therapy at home with [facility].  He was to  follow with me two weeks after surgery.      Vickki Hearing, M.D.  Electronically Signed     SEH/MEDQ  D:  09/26/2006  T:  09/26/2006  Job:  [id number]

## 2010-08-07 NOTE — Group Therapy Note (Signed)
NAME:  Ryan [name], Ryan [name]                ACCOUNT NO.:  [id number]   MEDICAL RECORD NO.:  [id number]          PATIENT TYPE:  INP   LOCATION:  A331                          FACILITY:  [facility]   PHYSICIAN:  Vickki Hearing, M.D.DATE OF BIRTH:  1946/02/27   DATE OF PROCEDURE:  09/05/2006  DATE OF DISCHARGE:                                 PROGRESS NOTE   He is postoperative day #3 from a left total knee.  He has progressed  well in therapy.  He can be discharged today on Coumadin 5 mg daily.  We  are waiting for his PT/INR, hemoglobin for today.  His intraoperative  cultures from surgery were negative (he had a remote infection 25 years  ago).      Vickki Hearing, M.D.  Electronically Signed     SEH/MEDQ  D:  09/05/2006  T:  09/05/2006  Job:  [id number]

## 2010-08-10 NOTE — H&P (Signed)
NAME:  Ryan [name], LIBY NO.:  [id number]   MEDICAL RECORD NO.:  [id number]            PATIENT TYPE:   LOCATION:                                 FACILITY:   PHYSICIAN:  Vickki Hearing, M.D.DATE OF BIRTH:  1946/03/20   DATE OF ADMISSION:  DATE OF DISCHARGE:  LH                                HISTORY & PHYSICAL   CHIEF COMPLAINT:  Mass, left elbow.   HISTORY:  A 65 year old male has a mass on the left elbow consistent with a  spur and bursitis.  It was aspirated.  It recurred, and he now wishes to  have it taken off.   SURGICAL HISTORY:  1.  Left elbow surgery 30 years ago.  2.  Left knee surgery.  3.  Cervical disk surgery.  4.  Appendectomy.   Family history of cancer.   Allergies to CODEINE and MORPHINE.   MEDICATIONS:  Currently none.   ORTHOPEDIC PHYSICAL EXAMINATION:  VITAL SIGNS:  Weight 178.  Pulse 64,  respiratory rate 18.  GENERAL:  Grooming and hygiene normal, no deformities.  Developed normally.  Nutrition normal.  Frame small.  CARDIOVASCULAR:  Pulses are intact.  MUSCULOSKELETAL:  He has full elbow flexion but lacks 20 degrees of  extension.  His pronation and supination are relatively normal.  There is  crepitation in the elbow.  There are no strength deficits or instability.  Skin over the olecranon and just distal to it, there is a large bursal sac  there.  He has no neurologic deficit.  His pulses are intact.  There are  scars over the elbow from surgery.   The radiographs show a fibrous union of a previous olecranon fracture.  There are multiple bone spurs and soft tissue swelling.  There is a bursal  sac.   IMPRESSION:  Left elbow recurrent bursitis.   Recommend excision of left elbow mass.     Weyman Croon   SEH/MEDQ  D:  03/08/2004  T:  03/08/2004  Job:  [id number]   cc:   [facility]  Fax: [phone]

## 2010-08-10 NOTE — Op Note (Signed)
NAME:  Ryan [name], Ryan [name]                ACCOUNT NO.:  [id number]   MEDICAL RECORD NO.:  [id number]          PATIENT TYPE:  AMB   LOCATION:  DAY                           FACILITY:  [facility]   PHYSICIAN:  Vickki Hearing, M.D.DATE OF BIRTH:  05-23-1945   DATE OF PROCEDURE:  DATE OF DISCHARGE:                                 OPERATIVE REPORT   PREOPERATIVE DIAGNOSIS:  Olecranon bursitis, left elbow.   POSTOPERATIVE DIAGNOSIS:  Olecranon bursitis, left elbow.   OPERATION PERFORMED:  Removal of olecranon bursa and spurs from the left  elbow.   SURGEON:  Vickki Hearing, M.D.   ANESTHESIA:  General.   OPERATIVE FINDINGS:  There was a fibrous union of his olecranon.  There was  a large bursal sac and there was a spur at the triceps insertion.  There was  motion at the fibrous union site from a previous surgery done over 20 years  ago.   COMPLICATIONS:  None.   COUNTS:  Correct.   INDICATIONS FOR PROCEDURE:  Pain and swelling.   DESCRIPTION OF PROCEDURE:  Danna Casella was identified in the preop holding  area.  He was noted to mark his left elbow as the surgical site and I signed  my initials in the same area.  He was given preoperative antibiotics and  taken to the operating room for general anesthesia.  After satisfactory  anesthesia, time out was taken and the surgical site, patient's identity and  procedure were confirmed.   An incision was made over the olecranon bursa.  This was excised.  At this  point we removed several spurs.  It was noted that there was gross motion at  the fibrous unit site and this was not taken down or bothered with in any  fashion.  The wound was closed including using 2-0 Ethibond to close the  triceps split and 2-0 Monocryl was used to close the skin.  A Hemovac drain  was placed and a staple suture was used to close the wound.  Sterile  dressings were applied.  The patient was extubated and taken to recovery  room in stable condition.     Weyman Croon  SEH/MEDQ  D:  04/07/2004  T:  04/07/2004  Job:  [id number]

## 2010-09-05 ENCOUNTER — Ambulatory Visit: Payer: Medicare Other | Admitting: Orthopedic Surgery

## 2010-09-12 ENCOUNTER — Ambulatory Visit: Payer: Self-pay | Admitting: Orthopedic Surgery

## 2010-10-17 ENCOUNTER — Ambulatory Visit (INDEPENDENT_AMBULATORY_CARE_PROVIDER_SITE_OTHER): Payer: Medicare Other | Admitting: Orthopedic Surgery

## 2010-10-17 DIAGNOSIS — Z96659 Presence of unspecified artificial knee joint: Secondary | ICD-10-CM

## 2010-10-17 NOTE — Progress Notes (Signed)
Annual follow-up LEFT total knee.  Date of surgery. June 10  Diagnosis OA knee  Complaints: No complaints  Exam. GENERAL: normal development   CDV: pulses are normal   Skin: normal  Lymph: deferred  Psychiatric: awake, alert and oriented  Neuro: normal sensation  MSK  Knee flexion, 125  X-ray:Separately identifiable. X-ray report.  Total knee replacement annual x-ray.  All 3 components are properly aligned. Overall knee alignment is normal. No signs of loosening.  Impression no complicating findings and his postoperative knee replacement film   Plan F/U in 1 year for repeat films

## 2011-01-10 LAB — CROSSMATCH
ABO/RH(D): A POS
Antibody Screen: NEGATIVE

## 2011-01-10 LAB — DIFFERENTIAL
Basophils Absolute: 0
Basophils Absolute: 0
Basophils Absolute: 0
Basophils Relative: 0
Basophils Relative: 0
Basophils Relative: 0
Eosinophils Absolute: 0
Eosinophils Absolute: 0
Eosinophils Absolute: 0.1
Eosinophils Relative: 0
Eosinophils Relative: 0
Eosinophils Relative: 0
Lymphocytes Relative: 10 — ABNORMAL LOW
Lymphocytes Relative: 8 — ABNORMAL LOW
Lymphocytes Relative: 8 — ABNORMAL LOW
Lymphs Abs: 1.2
Lymphs Abs: 1.3
Lymphs Abs: 1.3
Monocytes Absolute: 1.7 — ABNORMAL HIGH
Monocytes Absolute: 1.9 — ABNORMAL HIGH
Monocytes Absolute: 2.2 — ABNORMAL HIGH
Monocytes Relative: 12 — ABNORMAL HIGH
Monocytes Relative: 14 — ABNORMAL HIGH
Monocytes Relative: 15 — ABNORMAL HIGH
Neutro Abs: 11.1 — ABNORMAL HIGH
Neutro Abs: 12.6 — ABNORMAL HIGH
Neutro Abs: 9.1 — ABNORMAL HIGH
Neutrophils Relative %: 75
Neutrophils Relative %: 77
Neutrophils Relative %: 79 — ABNORMAL HIGH

## 2011-01-10 LAB — BASIC METABOLIC PANEL
BUN: 10
BUN: 10
BUN: 10
BUN: 13
CO2: 25
CO2: 26
CO2: 28
CO2: 29
Calcium: 8.3 — ABNORMAL LOW
Calcium: 8.5
Calcium: 8.5
Calcium: 9.2
Chloride: 103
Chloride: 103
Chloride: 104
Chloride: 99
Creatinine, Ser: 1.11
Creatinine, Ser: 1.14
Creatinine, Ser: 1.15
Creatinine, Ser: 1.16
GFR calc Af Amer: >60
GFR calc Af Amer: >60
GFR calc Af Amer: >60
GFR calc Af Amer: >60
GFR calc non Af Amer: >60
GFR calc non Af Amer: >60
GFR calc non Af Amer: >60
GFR calc non Af Amer: >60
Glucose, Bld: 108 — ABNORMAL HIGH
Glucose, Bld: 115 — ABNORMAL HIGH
Glucose, Bld: 122 — ABNORMAL HIGH
Glucose, Bld: 89
Potassium: 4.1
Potassium: 4.5
Potassium: 4.5
Potassium: 4.6
Sodium: 132 — ABNORMAL LOW
Sodium: 136
Sodium: 137
Sodium: 137

## 2011-01-10 LAB — CBC
HCT: 30.3 — ABNORMAL LOW
HCT: 30.7 — ABNORMAL LOW
HCT: 33.1 — ABNORMAL LOW
HCT: 41
Hemoglobin: 10.8 — ABNORMAL LOW
Hemoglobin: 10.9 — ABNORMAL LOW
Hemoglobin: 11.6 — ABNORMAL LOW
Hemoglobin: 14.3
MCHC: 34.8
MCHC: 34.9
MCHC: 35.5
MCHC: 35.5
MCV: 88.7
MCV: 88.9
MCV: 89.2
MCV: 89.5
Platelets: 198
Platelets: 213
Platelets: 217
Platelets: 252
RBC: 3.4 — ABNORMAL LOW
RBC: 3.46 — ABNORMAL LOW
RBC: 3.7 — ABNORMAL LOW
RBC: 4.61
RDW: 12.7
RDW: 12.8
RDW: 12.9
RDW: 12.9
WBC: 12.2 — ABNORMAL HIGH
WBC: 14.6 — ABNORMAL HIGH
WBC: 15.9 — ABNORMAL HIGH
WBC: 8.5

## 2011-01-10 LAB — PROTIME-INR
INR: 0.9
INR: 1
INR: 1.6 — ABNORMAL HIGH
INR: 2.6 — ABNORMAL HIGH
Prothrombin Time: 12.2
Prothrombin Time: 13.7
Prothrombin Time: 19.4 — ABNORMAL HIGH
Prothrombin Time: 29 — ABNORMAL HIGH

## 2011-01-10 LAB — APTT: aPTT: 30

## 2011-01-10 LAB — ANAEROBIC CULTURE: Gram Stain: NONE SEEN

## 2011-01-10 LAB — WOUND CULTURE: Culture: NO GROWTH

## 2011-01-10 LAB — ABO/RH: ABO/RH(D): A POS

## 2011-05-02 ENCOUNTER — Encounter: Payer: Self-pay | Admitting: Orthopedic Surgery

## 2011-05-06 ENCOUNTER — Ambulatory Visit (INDEPENDENT_AMBULATORY_CARE_PROVIDER_SITE_OTHER): Payer: Medicare Other | Admitting: Orthopedic Surgery

## 2011-05-06 ENCOUNTER — Encounter: Payer: Self-pay | Admitting: Orthopedic Surgery

## 2011-05-06 VITALS — BP 148/92 | Ht 68.0 in | Wt 182.0 lb

## 2011-05-06 DIAGNOSIS — M7020 Olecranon bursitis, unspecified elbow: Secondary | ICD-10-CM | POA: Insufficient documentation

## 2011-05-06 NOTE — Patient Instructions (Addendum)
Surgery March 8 th left elbow bursectomy   This procedure has been fully reviewed

## 2011-05-06 NOTE — Progress Notes (Signed)
AP lateral LEFT elbow  LEFT elbow swelling and pain  There is a fibrous union of the olecranon.  The multiple cysts in the olecranon as well as the ulna.  The ulnohumeral joint has arthritis.  There is a loose body in the anterior capsule  Impression degenerative arthritis ulnohumeral joint with loose body in the anterior capsule and fibrous union of olecranon.

## 2011-05-06 NOTE — Progress Notes (Signed)
  Subjective:    Ryan [name] is a 66 y.o. male who presents with recurrent swelling and pain over the LEFT olecranon bursa.  Back in 2006 he had excision of olecranon spur with olecranon bursectomy and was noted at the time to have a fibrous union of his olecranon from her fracture he sustained many many years ago which included a both bone forearm fracture which was treated without internal fixation area  He presents now again with swelling and pain in his LEFT olecranon bursa which is waking him up causing him to have difficulty with his activities of daily living and he would like it removed  We did offer him an aspiration injection he declined.   The following portions of the patient's history were reviewed and updated as appropriate: allergies, current medications, past family history, past medical history, past social history, past surgical history and problem list.  Review of Systems A comprehensive review of systems was negative.   Objective:    BP 148/92  Ht 5\' 8"  (1.727 m)  Wt 182 lb (82.555 kg)  BMI 27.67 kg/m2 Right elbow: without deformity and full active ROM  Left elbow:  swelling present, full active ROM, olecranon bursa present and the bursal area is tender and fluid filled.  Surprisingly he does have full range of motion.  There is some crepitance there but no tenderness over the fibrous union at the olecranon., the ligaments are stable he has excellent flexion and extension power   X-ray left elbow: no fracture, dislocation, swelling or degenerative changes noted and the most striking feature of the x-rays the fibrous union of the olecranon.  Degenerative changes of the ulnohumeral joint and the radiocapitellar joint there is an osteophyte or loose body in the anterior joint capsule and there is significant cyst formation and osteopenia at the olecranon and its corresponding portion of the ulna.   Assessment:    left elbow bursitis    Plan:    excision of olecranon  bursa LEFT elbow

## 2011-05-15 ENCOUNTER — Other Ambulatory Visit: Payer: Self-pay | Admitting: *Deleted

## 2011-05-24 ENCOUNTER — Ambulatory Visit (HOSPITAL_COMMUNITY): Payer: Medicare Other

## 2011-05-24 ENCOUNTER — Encounter (HOSPITAL_COMMUNITY): Payer: Self-pay | Admitting: Pharmacy Technician

## 2011-05-27 ENCOUNTER — Encounter (HOSPITAL_COMMUNITY)
Admission: RE | Admit: 2011-05-27 | Discharge: 2011-05-27 | Disposition: A | Discharge status: Home / Self Care | Payer: Medicare Other | Source: Ambulatory Visit | Attending: Orthopedic Surgery | Admitting: Orthopedic Surgery

## 2011-05-27 ENCOUNTER — Encounter (HOSPITAL_COMMUNITY): Payer: Self-pay

## 2011-05-27 ENCOUNTER — Other Ambulatory Visit: Payer: Self-pay

## 2011-05-27 LAB — HEMOGLOBIN AND HEMATOCRIT, BLOOD: HCT: 42.9 % (ref 39.0–52.0)

## 2011-05-27 MED ORDER — CHLORHEXIDINE GLUCONATE 4 % EX LIQD
60.0000 mL | Freq: Once | CUTANEOUS | Status: DC
  Filled 2011-05-27: qty 60

## 2011-05-27 NOTE — Patient Instructions (Signed)
[name]  05/27/2011   Your procedure is scheduled on:  05/31/11  Report to [facility] at 06:15 AM.  Call this number if you have problems the morning of surgery: [phone]   Remember:   Do not eat food:After Midnight.  May have clear liquids:until Midnight .  Clear liquids include soda, tea, black coffee, apple or grape juice, broth.  Take these medicines the morning of surgery with A SIP OF WATER: None   Do not wear jewelry, make-up or nail polish.  Do not wear lotions, powders, or perfumes. You may wear deodorant.  Do not shave 48 hours prior to surgery.  Do not bring valuables to the hospital.  Contacts, dentures or bridgework may not be worn into surgery.  Leave suitcase in the car. After surgery it may be brought to your room.  For patients admitted to the hospital, checkout time is 11:00 AM the day of discharge.   Patients discharged the day of surgery will not be allowed to drive home.  Name and phone number of your driver:   Special Instructions: CHG Shower Use Special Wash: 1/2 bottle night before surgery and 1/2 bottle morning of surgery.   Please read over the following fact sheets that you were given: Pain Booklet, MRSA Information, Surgical Site Infection Prevention, Anesthesia Post-op Instructions and Care and Recovery After Surgery    PATIENT INSTRUCTIONS POST-ANESTHESIA  IMMEDIATELY FOLLOWING SURGERY:  Do not drive or operate machinery for the first twenty four hours after surgery.  Do not make any important decisions for twenty four hours after surgery or while taking narcotic pain medications or sedatives.  If you develop intractable nausea and vomiting or a severe headache please notify your doctor immediately.  FOLLOW-UP:  Please make an appointment with your surgeon as instructed. You do not need to follow up with anesthesia unless specifically instructed to do so.  WOUND CARE INSTRUCTIONS (if applicable):  Keep a dry clean dressing on the  anesthesia/puncture wound site if there is drainage.  Once the wound has quit draining you may leave it open to air.  Generally you should leave the bandage intact for twenty four hours unless there is drainage.  If the epidural site drains for more than 36-48 hours please call the anesthesia department.  QUESTIONS?:  Please feel free to call your physician or the hospital operator if you have any questions, and they will be happy to assist you.     [facility] Anesthesia Department [address] [place] [phone]

## 2011-05-28 ENCOUNTER — Telehealth: Payer: Self-pay | Admitting: Orthopedic Surgery

## 2011-05-28 NOTE — Telephone Encounter (Signed)
Re: Surgery (left elbow bursectomy) scheduled 05/31/11 at [facility], no pre-authorization is required per Medicare guidelines. Patient has no other insurance coverage.
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
## 2011-05-31 ENCOUNTER — Encounter (HOSPITAL_COMMUNITY)
Admission: RE | Disposition: A | Discharge status: Home / Self Care | Payer: Self-pay | Source: Ambulatory Visit | Attending: Orthopedic Surgery

## 2011-05-31 ENCOUNTER — Encounter (HOSPITAL_COMMUNITY): Payer: Self-pay | Admitting: Anesthesiology

## 2011-05-31 ENCOUNTER — Encounter (HOSPITAL_COMMUNITY): Payer: Self-pay | Admitting: *Deleted

## 2011-05-31 ENCOUNTER — Ambulatory Visit (HOSPITAL_COMMUNITY): Payer: Medicare Other | Admitting: Anesthesiology

## 2011-05-31 ENCOUNTER — Ambulatory Visit (HOSPITAL_COMMUNITY)
Admission: RE | Admit: 2011-05-31 | Discharge: 2011-05-31 | Disposition: A | Discharge status: Home / Self Care | Payer: Medicare Other | Source: Ambulatory Visit | Attending: Orthopedic Surgery | Admitting: Orthopedic Surgery

## 2011-05-31 DIAGNOSIS — M7020 Olecranon bursitis, unspecified elbow: Secondary | ICD-10-CM

## 2011-05-31 DIAGNOSIS — Z01812 Encounter for preprocedural laboratory examination: Secondary | ICD-10-CM | POA: Insufficient documentation

## 2011-05-31 DIAGNOSIS — IMO0002 Reserved for concepts with insufficient information to code with codable children: Secondary | ICD-10-CM | POA: Insufficient documentation

## 2011-05-31 HISTORY — PX: OLECRANON BURSECTOMY: SHX2097

## 2011-05-31 SURGERY — BURSECTOMY, ELBOW
Anesthesia: General | Site: Elbow | Laterality: Left | Wound class: Clean

## 2011-05-31 MED ORDER — GLYCOPYRROLATE 0.2 MG/ML IJ SOLN
0.2000 mg | Freq: Once | INTRAMUSCULAR | Status: AC
  Administered 2011-05-31: 0.2 mg via INTRAVENOUS

## 2011-05-31 MED ORDER — BUPIVACAINE-EPINEPHRINE PF 0.5-1:200000 % IJ SOLN
INTRAMUSCULAR | Status: AC
  Filled 2011-05-31: qty 20

## 2011-05-31 MED ORDER — TRAMADOL HCL 50 MG PO TABS
50.0000 mg | ORAL_TABLET | Freq: Once | ORAL | Status: AC
  Administered 2011-05-31: 50 mg via ORAL

## 2011-05-31 MED ORDER — PROPOFOL 10 MG/ML IV EMUL
INTRAVENOUS | Status: AC
  Filled 2011-05-31: qty 20

## 2011-05-31 MED ORDER — CEFAZOLIN SODIUM-DEXTROSE 2-3 GM-% IV SOLR
INTRAVENOUS | Status: AC
  Filled 2011-05-31: qty 50

## 2011-05-31 MED ORDER — MIDAZOLAM HCL 2 MG/2ML IJ SOLN
1.0000 mg | INTRAMUSCULAR | Status: DC | PRN
  Administered 2011-05-31: 2 mg via INTRAVENOUS

## 2011-05-31 MED ORDER — MIDAZOLAM HCL 2 MG/2ML IJ SOLN
INTRAMUSCULAR | Status: AC
  Administered 2011-05-31: 2 mg via INTRAVENOUS
  Filled 2011-05-31: qty 2

## 2011-05-31 MED ORDER — FENTANYL CITRATE 0.05 MG/ML IJ SOLN: 25.0000 ug | INTRAMUSCULAR | Status: DC | PRN

## 2011-05-31 MED ORDER — ONDANSETRON HCL 4 MG/2ML IJ SOLN: 4.0000 mg | Freq: Once | INTRAMUSCULAR | Status: DC | PRN

## 2011-05-31 MED ORDER — PROPOFOL 10 MG/ML IV EMUL
INTRAVENOUS | Status: DC | PRN
  Administered 2011-05-31: 30 mg via INTRAVENOUS
  Administered 2011-05-31: 150 mg via INTRAVENOUS

## 2011-05-31 MED ORDER — SODIUM CHLORIDE 0.9 % IR SOLN
Status: DC | PRN
  Administered 2011-05-31: 1000 mL

## 2011-05-31 MED ORDER — BUPIVACAINE-EPINEPHRINE 0.5% -1:200000 IJ SOLN
INTRAMUSCULAR | Status: DC | PRN
  Administered 2011-05-31: 20 mL

## 2011-05-31 MED ORDER — LACTATED RINGERS IV SOLN
INTRAVENOUS | Status: DC
  Administered 2011-05-31: 1000 mL via INTRAVENOUS

## 2011-05-31 MED ORDER — CEFAZOLIN SODIUM-DEXTROSE 2-3 GM-% IV SOLR
2.0000 g | INTRAVENOUS | Status: AC
  Administered 2011-05-31: 2 g via INTRAVENOUS

## 2011-05-31 MED ORDER — ACETAMINOPHEN 325 MG PO TABS: 325.0000 mg | ORAL_TABLET | ORAL | Status: DC | PRN

## 2011-05-31 MED ORDER — ONDANSETRON HCL 4 MG/2ML IJ SOLN
INTRAMUSCULAR | Status: AC
  Administered 2011-05-31: 4 mg via INTRAVENOUS
  Filled 2011-05-31: qty 2

## 2011-05-31 MED ORDER — EPHEDRINE SULFATE 50 MG/ML IJ SOLN
INTRAMUSCULAR | Status: AC
  Filled 2011-05-31: qty 1

## 2011-05-31 MED ORDER — LIDOCAINE HCL (PF) 1 % IJ SOLN
INTRAMUSCULAR | Status: AC
  Filled 2011-05-31: qty 5

## 2011-05-31 MED ORDER — EPHEDRINE SULFATE 50 MG/ML IJ SOLN
INTRAMUSCULAR | Status: DC | PRN
  Administered 2011-05-31: 5 mg via INTRAVENOUS

## 2011-05-31 MED ORDER — LIDOCAINE HCL (CARDIAC) 10 MG/ML IV SOLN
INTRAVENOUS | Status: DC | PRN
  Administered 2011-05-31: 10 mg via INTRAVENOUS

## 2011-05-31 MED ORDER — ONDANSETRON HCL 4 MG/2ML IJ SOLN
4.0000 mg | Freq: Once | INTRAMUSCULAR | Status: AC
  Administered 2011-05-31: 4 mg via INTRAVENOUS

## 2011-05-31 MED ORDER — TRAMADOL HCL 50 MG PO TABS
ORAL_TABLET | ORAL | Status: AC
  Administered 2011-05-31: 50 mg via ORAL
  Filled 2011-05-31: qty 1

## 2011-05-31 MED ORDER — KETOROLAC TROMETHAMINE 30 MG/ML IJ SOLN
30.0000 mg | Freq: Once | INTRAMUSCULAR | Status: AC
  Administered 2011-05-31: 30 mg via INTRAVENOUS

## 2011-05-31 MED ORDER — HYDROCODONE-ACETAMINOPHEN 5-325 MG PO TABS: 1.0000 | ORAL_TABLET | ORAL | Status: AC | PRN

## 2011-05-31 MED ORDER — FENTANYL CITRATE 0.05 MG/ML IJ SOLN
INTRAMUSCULAR | Status: DC | PRN
  Administered 2011-05-31 (×2): 25 ug via INTRAVENOUS
  Administered 2011-05-31: 50 ug via INTRAVENOUS

## 2011-05-31 MED ORDER — GLYCOPYRROLATE 0.2 MG/ML IJ SOLN
INTRAMUSCULAR | Status: AC
  Filled 2011-05-31: qty 1

## 2011-05-31 MED ORDER — FENTANYL CITRATE 0.05 MG/ML IJ SOLN
INTRAMUSCULAR | Status: AC
  Filled 2011-05-31: qty 2

## 2011-05-31 MED ORDER — ACETAMINOPHEN 10 MG/ML IV SOLN
1000.0000 mg | Freq: Once | INTRAVENOUS | Status: AC
  Administered 2011-05-31: 1000 mg via INTRAVENOUS

## 2011-05-31 MED ORDER — ONDANSETRON HCL 4 MG/2ML IJ SOLN
INTRAMUSCULAR | Status: AC
  Filled 2011-05-31: qty 2

## 2011-05-31 MED ORDER — ACETAMINOPHEN 10 MG/ML IV SOLN
INTRAVENOUS | Status: AC
  Administered 2011-05-31: 1000 mg via INTRAVENOUS
  Filled 2011-05-31: qty 100

## 2011-05-31 MED ORDER — KETOROLAC TROMETHAMINE 30 MG/ML IJ SOLN
INTRAMUSCULAR | Status: AC
  Administered 2011-05-31: 30 mg via INTRAVENOUS
  Filled 2011-05-31: qty 1

## 2011-05-31 SURGICAL SUPPLY — 52 items
BAG HAMPER (MISCELLANEOUS) ×2 IMPLANT
BANDAGE ELASTIC 4 VELCRO NS (GAUZE/BANDAGES/DRESSINGS) ×1 IMPLANT
BANDAGE ELASTIC 6 VELCRO NS (GAUZE/BANDAGES/DRESSINGS) IMPLANT
BANDAGE ESMARK 4X12 BL STRL LF (DISPOSABLE) ×1 IMPLANT
BIT DRILL 2.0X128 (BIT) ×1 IMPLANT
BNDG CMPR 12X4 ELC STRL LF (DISPOSABLE) ×1
BNDG COHESIVE 4X5 TAN NS LF (GAUZE/BANDAGES/DRESSINGS) ×1 IMPLANT
BNDG ESMARK 4X12 BLUE STRL LF (DISPOSABLE) ×2
CHLORAPREP W/TINT 26ML (MISCELLANEOUS) ×2 IMPLANT
CLOTH BEACON ORANGE TIMEOUT ST (SAFETY) ×2 IMPLANT
COVER LIGHT HANDLE STERIS (MISCELLANEOUS) ×4 IMPLANT
CUFF TOURNIQUET SINGLE 18IN (TOURNIQUET CUFF) IMPLANT
CUFF TOURNIQUET SINGLE 24IN (TOURNIQUET CUFF) ×1 IMPLANT
DECANTER SPIKE VIAL GLASS SM (MISCELLANEOUS) ×2 IMPLANT
ELECT REM PT RETURN 9FT ADLT (ELECTROSURGICAL) ×2
ELECTRODE REM PT RTRN 9FT ADLT (ELECTROSURGICAL) ×1 IMPLANT
GAUZE KERLIX 2  STERILE LF (GAUZE/BANDAGES/DRESSINGS) ×2 IMPLANT
GAUZE XEROFORM 5X9 LF (GAUZE/BANDAGES/DRESSINGS) ×1 IMPLANT
GLOVE ECLIPSE 6.5 STRL STRAW (GLOVE) ×1 IMPLANT
GLOVE ECLIPSE 8.0 STRL XLNG CF (GLOVE) ×1 IMPLANT
GLOVE INDICATOR 7.0 STRL GRN (GLOVE) ×1 IMPLANT
GLOVE INDICATOR 8.5 STRL (GLOVE) ×1 IMPLANT
GLOVE SKINSENSE NS SZ8.0 LF (GLOVE) ×1
GLOVE SKINSENSE STRL SZ8.0 LF (GLOVE) ×1 IMPLANT
GLOVE SS N UNI LF 8.5 STRL (GLOVE) ×2 IMPLANT
GOWN STRL REIN 3XL LVL4 (GOWN DISPOSABLE) ×1 IMPLANT
GOWN STRL REIN XL XLG (GOWN DISPOSABLE) ×6 IMPLANT
INST SET MINOR BONE (KITS) ×2 IMPLANT
KIT ROOM TURNOVER APOR (KITS) ×2 IMPLANT
MANIFOLD NEPTUNE II (INSTRUMENTS) ×2 IMPLANT
NDL HYPO 21X1.5 SAFETY (NEEDLE) ×1 IMPLANT
NEEDLE HYPO 21X1.5 SAFETY (NEEDLE) ×2 IMPLANT
NS IRRIG 1000ML POUR BTL (IV SOLUTION) ×2 IMPLANT
PACK BASIC LIMB (CUSTOM PROCEDURE TRAY) ×2 IMPLANT
PAD ARMBOARD 7.5X6 YLW CONV (MISCELLANEOUS) ×2 IMPLANT
PAD CAST 4YDX4 CTTN HI CHSV (CAST SUPPLIES) ×1 IMPLANT
PADDING CAST COTTON 4X4 STRL (CAST SUPPLIES)
PUTTY DBX 1CC (Putty) ×2 IMPLANT
PUTTY DBX 1CC DEPUY (Putty) IMPLANT
SET BASIN LINEN APH (SET/KITS/TRAYS/PACK) ×2 IMPLANT
SLING ARM FOAM STRAP MED (SOFTGOODS) ×1 IMPLANT
SPONGE GAUZE 4X4 12PLY (GAUZE/BANDAGES/DRESSINGS) ×1 IMPLANT
SPONGE LAP 18X18 X RAY DECT (DISPOSABLE) ×2 IMPLANT
STAPLER VISISTAT 35W (STAPLE) ×1 IMPLANT
SUT ETHILON 3 0 FSL (SUTURE) ×1 IMPLANT
SUT MON AB 0 CT1 (SUTURE) ×1 IMPLANT
SUT MON AB 2-0 SH 27 (SUTURE) ×2
SUT MON AB 2-0 SH27 (SUTURE) IMPLANT
SUT VIC AB 1 CT1 27 (SUTURE)
SUT VIC AB 1 CT1 27XBRD ANTBC (SUTURE) IMPLANT
SYR BULB IRRIGATION 50ML (SYRINGE) ×2 IMPLANT
SYR CONTROL 10ML LL (SYRINGE) ×2 IMPLANT

## 2011-05-31 NOTE — Interval H&P Note (Signed)
History and Physical Interval Note:  05/31/2011 7:23 AM  Ryan [name]  has presented today for surgery, with the diagnosis of BURSITIS LEFT ELBOW  The various methods of treatment have been discussed with the patient and family. After consideration of risks, benefits and other options for treatment, the patient has consented to  Procedure(s) (LRB):LEFT OLECRANON BURSA (Left) as a surgical intervention .  The patients' history has been reviewed, patient examined, no change in status, stable for surgery.  I have reviewed the patients' chart and labs.  Questions were answered to the patient's satisfaction.     Fuller Canada

## 2011-05-31 NOTE — Progress Notes (Signed)
Sling to lt arm.

## 2011-05-31 NOTE — Op Note (Addendum)
05/31/2011  8:35 AM  PATIENT:  Ryan [name]  66 y.o. male  PRE-OPERATIVE DIAGNOSIS:  BURSITIS LEFT ELBOW  POST-OPERATIVE DIAGNOSIS:  BURSITIS LEFT ELBOW, non union olecranon   FINDINGS: Large olecranon bursal sac. 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     PROCEDURE:  Procedure(s) (LRB): OLECRANON BURSECTOMY AND BONE GRATING OLECRANON with DBX ALLOGRAFT 1CC  (Left)  Details of procedure the site of the surgery was confirmed and marked in the preop area. The consent was signed the chart was updated. The patient was taken to the operating room and given Ancef 2 g IV.  Intubation was then performed smoothly without complication  The patient was in supine position  A tourniquet was placed on his left upper arm. He was then prepped and draped sterilely. The timeout procedure was then completed.  A previous incision from her previous olecranon bursectomy was used. Unfortunately the skin was adherent to the bursal sac and was violated approximately 2 cm.  The bursal sac was removed several sutures were also removed. I then turned my attention to the olecranon fibrous union that was seen on x-ray. This area was allowing direct medication with the joint and hence the bursal fluid continued to leak into the skin area causing the swelling.  I debrided this area drill bit and then placed 1 cc of DBX bone putty across the fracture. I did not take the entire union down as to not destabilize the fragment.  The wound was then irrigated and closed with 2-0 Monocryl and staples. The area where the skin was opened was closed with 3-0 nylon sutures in interrupted fashion.  20 cc of Marcaine with epinephrine was injected around the wound edges  After sterile bandages were applied the tourniquet was released and the patient was placed in a sling  Followup scheduled for Monday.   SURGEON:  Surgeon(s) and Role:    * Fuller Canada, MD - Primary  PHYSICIAN ASSISTANT:   ASSISTANTS: RON  HARIS   ANESTHESIA:   general  EBL:  Total I/O In: 800 [I.V.:800] Out: 0   BLOOD ADMINISTERED:none  DRAINS: none   LOCAL MEDICATIONS USED:  MARCAINE  WITH EPI 20 CC  SPECIMEN:  Source of Specimen:  LEFT OLECRANON BURSA  DISPOSITION OF SPECIMEN:  PATHOLOGY  COUNTS:  YES  TOURNIQUET:   Total Tourniquet Time Documented: Upper Arm (Left) - 43 minutes  DICTATION: .Reubin Milan Dictation  PLAN OF CARE: Discharge to home after PACU  PATIENT DISPOSITION:  PACU - hemodynamically stable.   Delay start of Pharmacological VTE agent (>24hrs) due to surgical blood loss or risk of bleeding: not applicable

## 2011-05-31 NOTE — Transfer of Care (Signed)
Immediate Anesthesia Transfer of Care Note  Patient: Ryan [name]  Procedure(s) Performed: Procedure(s) (LRB): OLECRANON BURSA (Left)  Patient Location: PACU  Anesthesia Type: General  Level of Consciousness: awake  Airway & Oxygen Therapy: Patient Spontanous Breathing and non-rebreather face mask  Post-op Assessment: Report given to PACU RN, Post -op Vital signs reviewed and stable and Patient moving all extremities  Post vital signs: Reviewed and stable  Complications: No apparent anesthesia complications

## 2011-05-31 NOTE — Anesthesia Postprocedure Evaluation (Signed)
Anesthesia Post Note  Patient: Ryan [name]  Procedure(s) Performed: Procedure(s) (LRB): OLECRANON BURSA (Left)  Anesthesia type: General  Patient location: PACU  Post pain: Pain level controlled  Post assessment: Post-op Vital signs reviewed, Patient's Cardiovascular Status Stable, Respiratory Function Stable, Patent Airway, No signs of Nausea or vomiting and Pain level controlled  Last Vitals:  Filed Vitals:   05/31/11 0841  BP: 141/79  Pulse: 91  Temp: 36.5 C  Resp: 14    Post vital signs: Reviewed and stable  Level of consciousness: awake and alert   Complications: No apparent anesthesia complications

## 2011-05-31 NOTE — Anesthesia Procedure Notes (Signed)
Procedure Name: LMA Insertion Date/Time: 05/31/2011 7:35 AM Performed by: Ryan [name] Pre-anesthesia Checklist: Patient identified, Patient being monitored, Emergency Drugs available, Timeout performed and Suction available Patient Re-evaluated:Patient Re-evaluated prior to inductionOxygen Delivery Method: Circle System Utilized Preoxygenation: Pre-oxygenation with 100% oxygen Intubation Type: IV induction Ventilation: Mask ventilation without difficulty LMA: LMA inserted LMA Size: 4.0 Number of attempts: 1 Placement Confirmation: positive ETCO2 and breath sounds checked- equal and bilateral

## 2011-05-31 NOTE — Brief Op Note (Addendum)
05/31/2011  8:35 AM  PATIENT:  Ryan [name]  66 y.o. male  PRE-OPERATIVE DIAGNOSIS:  BURSITIS LEFT ELBOW  POST-OPERATIVE DIAGNOSIS:  BURSITIS LEFT ELBOW, non union olecranon   PROCEDURE:  Procedure(s) (LRB): OLECRANON BURSECTOMY AND BONE GRATING OLECRANON  (Left)  SURGEON:  Surgeon(s) and Role:    * Fuller Canada, MD - Primary  PHYSICIAN ASSISTANT:   ASSISTANTS: RON HARIS   ANESTHESIA:   general  EBL:  Total I/O In: 800 [I.V.:800] Out: 0   BLOOD ADMINISTERED:none  DRAINS: none   LOCAL MEDICATIONS USED:  MARCAINE  WITH EPI 20 CC  SPECIMEN:  Source of Specimen:  LEFT OLECRANON BURSA  DISPOSITION OF SPECIMEN:  PATHOLOGY  COUNTS:  YES  TOURNIQUET:   Total Tourniquet Time Documented: Upper Arm (Left) - 43 minutes  DICTATION: .Dragon Dictation  PLAN OF CARE: Discharge to home after PACU  PATIENT DISPOSITION:  PACU - hemodynamically stable.   Delay start of Pharmacological VTE agent (>24hrs) due to surgical blood loss or risk of bleeding: not applicable

## 2011-05-31 NOTE — Anesthesia Preprocedure Evaluation (Signed)
Anesthesia Evaluation  Patient identified by MRN, date of birth, ID band Patient awake    Reviewed: Allergy & Precautions, H&P , NPO status , Patient's Chart, lab work & pertinent test results  Airway Mallampati: II TM Distance: >3 FB Neck ROM: Full    Dental  (+) Edentulous Upper   Pulmonary Current Smoker,    Pulmonary exam normal       Cardiovascular negative cardio ROS  Rhythm:Regular Rate:Normal     Neuro/Psych negative neurological ROS  negative psych ROS   GI/Hepatic negative GI ROS, Neg liver ROS,   Endo/Other  negative endocrine ROS  Renal/GU negative Renal ROS  negative genitourinary   Musculoskeletal   Abdominal Normal abdominal exam  (+)   Peds  Hematology negative hematology ROS (+)   Anesthesia Other Findings   Reproductive/Obstetrics                           Anesthesia Physical Anesthesia Plan  ASA: II  Anesthesia Plan: General   Post-op Pain Management:    Induction: Intravenous  Airway Management Planned: LMA  Additional Equipment:   Intra-op Plan:   Post-operative Plan: Extubation in OR  Informed Consent: I have reviewed the patients History and Physical, chart, labs and discussed the procedure including the risks, benefits and alternatives for the proposed anesthesia with the patient or authorized representative who has indicated his/her understanding and acceptance.     Plan Discussed with: CRNA  Anesthesia Plan Comments:         Anesthesia Quick Evaluation

## 2011-06-03 ENCOUNTER — Encounter (HOSPITAL_COMMUNITY): Payer: Self-pay | Admitting: Orthopedic Surgery

## 2011-06-03 ENCOUNTER — Ambulatory Visit (INDEPENDENT_AMBULATORY_CARE_PROVIDER_SITE_OTHER): Payer: Medicare Other | Admitting: Orthopedic Surgery

## 2011-06-03 VITALS — BP 150/86 | Ht 68.0 in | Wt 182.0 lb

## 2011-06-03 DIAGNOSIS — IMO0002 Reserved for concepts with insufficient information to code with codable children: Secondary | ICD-10-CM | POA: Insufficient documentation

## 2011-06-03 DIAGNOSIS — M7030 Other bursitis of elbow, unspecified elbow: Secondary | ICD-10-CM | POA: Insufficient documentation

## 2011-06-03 DIAGNOSIS — M7020 Olecranon bursitis, unspecified elbow: Secondary | ICD-10-CM

## 2011-06-03 NOTE — Progress Notes (Signed)
Patient ID: Ryan [name], male   DOB: [birth year], 66 y.o.   MRN: [id number] Chief Complaint  Patient presents with  . Follow-up    elbow surgery Fri 06/03/11    Chief Complaint  Patient presents with  . Follow-up    elbow surgery Fri 06/03/11    05/31/2011  8:35 AM  PATIENT: Ryan [name] 66 y.o. male  PRE-OPERATIVE DIAGNOSIS: BURSITIS LEFT ELBOW  POST-OPERATIVE DIAGNOSIS: BURSITIS LEFT ELBOW, non union olecranon  FINDINGS: Large olecranon bursal sac. 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  PROCEDURE: Procedure(s) (LRB):  OLECRANON BURSECTOMY AND BONE GRATING OLECRANON with DBX ALLOGRAFT 1CC (Left)  Surgical site looks clean no signs of infection  Remove staples next week along with sutures

## 2011-06-03 NOTE — Patient Instructions (Signed)
Ok to take a shower

## 2011-06-04 ENCOUNTER — Telehealth: Payer: Self-pay | Admitting: Orthopedic Surgery

## 2011-06-04 NOTE — Telephone Encounter (Signed)
Patient is calling about dressing change, following his surgery 05/31/11 (he had been here for post op 1 yesterday; states when he removed it, it started to bleed; asking about washing it off in shower.  Routed to nurse to speak with patient.

## 2011-06-05 NOTE — Telephone Encounter (Signed)
Wanted to know if he could shower without dressing, advised to cover wound before showering

## 2011-06-12 ENCOUNTER — Ambulatory Visit (INDEPENDENT_AMBULATORY_CARE_PROVIDER_SITE_OTHER): Payer: Medicare Other | Admitting: Orthopedic Surgery

## 2011-06-12 ENCOUNTER — Encounter: Payer: Self-pay | Admitting: Orthopedic Surgery

## 2011-06-12 VITALS — Ht 68.0 in | Wt 182.0 lb

## 2011-06-12 DIAGNOSIS — M7030 Other bursitis of elbow, unspecified elbow: Secondary | ICD-10-CM

## 2011-06-12 DIAGNOSIS — M7020 Olecranon bursitis, unspecified elbow: Secondary | ICD-10-CM

## 2011-06-12 NOTE — Progress Notes (Signed)
Patient ID: Ryan [name], male   DOB: 20-Jun-1945, 66 y.o.   MRN: [id number] Chief Complaint  Patient presents with  . Follow-up    Recheck post op left elbow.    Postop visit for staple and suture removal  Date of surgery March 8  Plan incision Steri-Strips applied dressing  Followup as needed

## 2011-06-12 NOTE — Patient Instructions (Signed)
No lifting for 1 week

## 2011-07-02 ENCOUNTER — Ambulatory Visit (INDEPENDENT_AMBULATORY_CARE_PROVIDER_SITE_OTHER): Payer: Medicare Other | Admitting: Orthopedic Surgery

## 2011-07-02 ENCOUNTER — Encounter: Payer: Self-pay | Admitting: Orthopedic Surgery

## 2011-07-02 VITALS — BP 130/80 | Ht 68.0 in | Wt 182.0 lb

## 2011-07-02 DIAGNOSIS — T8131XA Disruption of external operation (surgical) wound, not elsewhere classified, initial encounter: Secondary | ICD-10-CM

## 2011-07-02 MED ORDER — CIPROFLOXACIN HCL 500 MG PO TABS: 500.0000 mg | ORAL_TABLET | Freq: Two times a day (BID) | ORAL | Status: AC

## 2011-07-02 NOTE — Progress Notes (Signed)
Patient ID: Ryan [name], male   DOB: 04/29/1945, 66 y.o.   MRN: [id number] Chief Complaint  Patient presents with  . Follow-up    Recheck left elbow.   Elbow bursectomy last month presents with a one-week history of drainage from the elbow.  He presents for reevaluation.  There is a dime-sized lesion over the elbow the lateral to the actual surgical incision.  During the bursectomy the skin was breached treated with suturing.  This is the area where the wound has broken down.  There is fibrinous exudate.  The patient consented to allow me to debride this area and we treated with wet-to-dry dressing with a followup dressing change in 48 hours.  Started on Cipro.  Procedure note after verbal consent the area was cleaned with peroxide and alcohol.  We've set the chloride to anesthetize the area.  A 15 blade was then used to debris the area to a bleeding bed.  No bone was exposed.  The patient tolerated this well and wet-to-dry dressing was applied.

## 2011-07-02 NOTE — Patient Instructions (Signed)
Keep clean keep dry  

## 2011-07-04 ENCOUNTER — Encounter: Payer: Self-pay | Admitting: Orthopedic Surgery

## 2011-07-04 ENCOUNTER — Ambulatory Visit (INDEPENDENT_AMBULATORY_CARE_PROVIDER_SITE_OTHER): Payer: Medicare Other | Admitting: Orthopedic Surgery

## 2011-07-04 VITALS — BP 122/78 | Ht 68.0 in | Wt 182.0 lb

## 2011-07-04 DIAGNOSIS — M7030 Other bursitis of elbow, unspecified elbow: Secondary | ICD-10-CM

## 2011-07-04 DIAGNOSIS — T8131XA Disruption of external operation (surgical) wound, not elsewhere classified, initial encounter: Secondary | ICD-10-CM

## 2011-07-04 NOTE — Progress Notes (Signed)
Patient ID: Ryan [name], male   DOB: Sep 11, 1945, 66 y.o.   MRN: [id number] Chief Complaint  Patient presents with  . Dressing Change    2 day recheck left elbow, DOS 05/31/11    Dressing change LEFT elbow decubitus exudate debrided. Wet-to-dry dressing applied.  Continue antibiotics keep clean and dry

## 2011-07-04 NOTE — Patient Instructions (Signed)
Keep clean ad dry

## 2011-07-08 ENCOUNTER — Encounter: Payer: Self-pay | Admitting: Orthopedic Surgery

## 2011-07-08 ENCOUNTER — Ambulatory Visit (INDEPENDENT_AMBULATORY_CARE_PROVIDER_SITE_OTHER): Payer: Medicare Other | Admitting: Orthopedic Surgery

## 2011-07-08 VITALS — BP 124/64 | Ht 68.0 in | Wt 182.0 lb

## 2011-07-08 DIAGNOSIS — T8131XA Disruption of external operation (surgical) wound, not elsewhere classified, initial encounter: Secondary | ICD-10-CM

## 2011-07-08 DIAGNOSIS — M7020 Olecranon bursitis, unspecified elbow: Secondary | ICD-10-CM

## 2011-07-08 NOTE — Progress Notes (Signed)
Patient ID: Ryan [name], male   DOB: May 20, 1945, 66 y.o.   MRN: [id number]  Chief Complaint  Patient presents with  . Dressing Change    dressing change left elbow, DOS 05/31/11   Dressing change LEFT elbow still continues to drain, this is most likely synovial fluid.  We will attempt dressing changes for the next week if no improvement then probably will need to have this nonunion/fibrous union taken down and fixed with a screw.

## 2011-07-09 ENCOUNTER — Ambulatory Visit (INDEPENDENT_AMBULATORY_CARE_PROVIDER_SITE_OTHER): Payer: Medicare Other | Admitting: Orthopedic Surgery

## 2011-07-09 ENCOUNTER — Ambulatory Visit: Payer: Medicare Other | Admitting: Orthopedic Surgery

## 2011-07-09 ENCOUNTER — Encounter: Payer: Self-pay | Admitting: Orthopedic Surgery

## 2011-07-09 VITALS — BP 132/82 | Ht 68.0 in | Wt 182.0 lb

## 2011-07-09 DIAGNOSIS — T8131XA Disruption of external operation (surgical) wound, not elsewhere classified, initial encounter: Secondary | ICD-10-CM

## 2011-07-09 NOTE — Progress Notes (Signed)
Patient ID: Ryan [name], male   DOB: 27-Aug-1945, 66 y.o.   MRN: [id number] Chief Complaint  Patient presents with  . Dressing Change    dressing change left elbow   Dressing changed no change in condition.  Next Step is probably a wound VAC.

## 2011-07-10 ENCOUNTER — Ambulatory Visit (INDEPENDENT_AMBULATORY_CARE_PROVIDER_SITE_OTHER): Payer: Medicare Other | Admitting: Orthopedic Surgery

## 2011-07-10 ENCOUNTER — Encounter: Payer: Self-pay | Admitting: Orthopedic Surgery

## 2011-07-10 VITALS — BP 120/72 | Ht 68.0 in | Wt 182.0 lb

## 2011-07-10 DIAGNOSIS — T8131XA Disruption of external operation (surgical) wound, not elsewhere classified, initial encounter: Secondary | ICD-10-CM

## 2011-07-10 NOTE — Progress Notes (Signed)
Patient ID: Ryan [name], male   DOB: 02-06-1946, 66 y.o.   MRN: [id number] Chief Complaint  Patient presents with  . Dressing Change    dressing change, DOS 05/31/11    Nurse to change dressing.  Wound bed is still feeling with fibrinous exudate, which is again debrided. Not responding as good as I would like. I think we will either have to debride surgically or debris followed by wound VAC or go to a wound vac immediately. I will reassess the wound tomorrow and make a final decision

## 2011-07-11 ENCOUNTER — Ambulatory Visit (INDEPENDENT_AMBULATORY_CARE_PROVIDER_SITE_OTHER): Payer: Medicare Other | Admitting: Orthopedic Surgery

## 2011-07-11 ENCOUNTER — Encounter: Payer: Self-pay | Admitting: Orthopedic Surgery

## 2011-07-11 VITALS — BP 124/78 | Ht 68.0 in | Wt 182.0 lb

## 2011-07-11 DIAGNOSIS — T8131XA Disruption of external operation (surgical) wound, not elsewhere classified, initial encounter: Secondary | ICD-10-CM

## 2011-07-11 NOTE — Progress Notes (Signed)
Patient ID: Ryan [name], male   DOB: [birth year], 66 y.o.   MRN: [id number] Chief Complaint  Patient presents with  . Dressing Change    dressing change left elbow    BP 124/78  Ht 5\' 8"  (1.727 m)  Wt 182 lb (82.555 kg)  BMI 27.67 kg/m2  Dressing changed granulation tissue at the base mild surrounding erythema no sign of streaking  Return on Monday change dressing on Saturday

## 2011-07-15 ENCOUNTER — Ambulatory Visit (INDEPENDENT_AMBULATORY_CARE_PROVIDER_SITE_OTHER): Payer: Medicare Other | Admitting: Orthopedic Surgery

## 2011-07-15 ENCOUNTER — Other Ambulatory Visit: Payer: Self-pay | Admitting: *Deleted

## 2011-07-15 ENCOUNTER — Encounter: Payer: Self-pay | Admitting: Orthopedic Surgery

## 2011-07-15 VITALS — BP 130/80 | Ht 68.0 in | Wt 182.0 lb

## 2011-07-15 DIAGNOSIS — T8131XA Disruption of external operation (surgical) wound, not elsewhere classified, initial encounter: Secondary | ICD-10-CM

## 2011-07-15 NOTE — Patient Instructions (Signed)
Surgery left elbow Thursday

## 2011-07-15 NOTE — Patient Instructions (Addendum)
[address]  07/15/2011   Your procedure is scheduled on:   07/18/2011  Report to [facility] at  700  AM.  Call this number if you have problems the morning of surgery: [phone]   Remember:   Do not eat food:After Midnight.  May have clear liquids:until Midnight .  Clear liquids include soda, tea, black coffee, apple or grape juice, broth.  Take these medicines the morning of surgery with A SIP OF WATER: norco   Do not wear jewelry, make-up or nail polish.  Do not wear lotions, powders, or perfumes. You may wear deodorant.  Do not shave 48 hours prior to surgery.  Do not bring valuables to the hospital.  Contacts, dentures or bridgework may not be worn into surgery.  Leave suitcase in the car. After surgery it may be brought to your room.  For patients admitted to the hospital, checkout time is 11:00 AM the day of discharge.   Patients discharged the day of surgery will not be allowed to drive home.  Name and phone number of your driver: family  Special Instructions: CHG Shower Use Special Wash: 1/2 bottle night before surgery and 1/2 bottle morning of surgery.   Please read over the following fact sheets that you were given: Pain Booklet, MRSA Information, Surgical Site Infection Prevention, Anesthesia Post-op Instructions and Care and Recovery After Surgery PATIENT INSTRUCTIONS POST-ANESTHESIA  IMMEDIATELY FOLLOWING SURGERY:  Do not drive or operate machinery for the first twenty four hours after surgery.  Do not make any important decisions for twenty four hours after surgery or while taking narcotic pain medications or sedatives.  If you develop intractable nausea and vomiting or a severe headache please notify your doctor immediately.  FOLLOW-UP:  Please make an appointment with your surgeon as instructed. You do not need to follow up with anesthesia unless specifically instructed to do so.  WOUND CARE INSTRUCTIONS (if applicable):  Keep a dry clean dressing on the  anesthesia/puncture wound site if there is drainage.  Once the wound has quit draining you may leave it open to air.  Generally you should leave the bandage intact for twenty four hours unless there is drainage.  If the epidural site drains for more than 36-48 hours please call the anesthesia department.  QUESTIONS?:  Please feel free to call your physician or the hospital operator if you have any questions, and they will be happy to assist you.     [facility] Anesthesia Department [address] [place] [phone]

## 2011-07-15 NOTE — Progress Notes (Signed)
Patient ID: Ryan [name], male   DOB: Jun 24, 1945, 66 y.o.   MRN: [id number] Chief Complaint  Patient presents with  . Follow-up    Recheck left elbow and wound check.   No change in condition of the wound in terms of size.   Surgery will be scheduled on Thursday for a wound closure, irrigation, debridement, wound closure and drain for this chronic draining wound, which has not responded to wet to dry dressing changes.  What is apparently happening is that the joint is producing fluid through the fibrous union and causing continued. Fluid collection, which was worsened Over the weekend while the patient was weed eating

## 2011-07-16 ENCOUNTER — Encounter (HOSPITAL_COMMUNITY)
Admission: RE | Admit: 2011-07-16 | Discharge: 2011-07-16 | Disposition: A | Discharge status: Home / Self Care | Payer: Medicare Other | Source: Ambulatory Visit | Attending: Orthopedic Surgery | Admitting: Orthopedic Surgery

## 2011-07-16 ENCOUNTER — Encounter (HOSPITAL_COMMUNITY): Payer: Self-pay | Admitting: Pharmacy Technician

## 2011-07-16 ENCOUNTER — Encounter (HOSPITAL_COMMUNITY): Payer: Self-pay

## 2011-07-16 HISTORY — DX: Other injury of unspecified body region, initial encounter: T14.8XXA

## 2011-07-16 MED ORDER — CHLORHEXIDINE GLUCONATE 4 % EX LIQD
60.0000 mL | Freq: Once | CUTANEOUS | Status: DC
  Filled 2011-07-16: qty 60

## 2011-07-18 ENCOUNTER — Ambulatory Visit (HOSPITAL_COMMUNITY)
Admission: RE | Admit: 2011-07-18 | Discharge: 2011-07-18 | Disposition: A | Discharge status: Home / Self Care | Payer: Medicare Other | Source: Ambulatory Visit | Attending: Orthopedic Surgery | Admitting: Orthopedic Surgery

## 2011-07-18 ENCOUNTER — Ambulatory Visit (HOSPITAL_COMMUNITY): Payer: Medicare Other | Admitting: Anesthesiology

## 2011-07-18 ENCOUNTER — Encounter (HOSPITAL_COMMUNITY): Payer: Self-pay | Admitting: *Deleted

## 2011-07-18 ENCOUNTER — Encounter (HOSPITAL_COMMUNITY): Payer: Self-pay | Admitting: Anesthesiology

## 2011-07-18 ENCOUNTER — Encounter (HOSPITAL_COMMUNITY)
Admission: RE | Disposition: A | Discharge status: Home / Self Care | Payer: Self-pay | Source: Ambulatory Visit | Attending: Orthopedic Surgery

## 2011-07-18 DIAGNOSIS — M7020 Olecranon bursitis, unspecified elbow: Secondary | ICD-10-CM

## 2011-07-18 DIAGNOSIS — M7030 Other bursitis of elbow, unspecified elbow: Secondary | ICD-10-CM

## 2011-07-18 DIAGNOSIS — Y838 Other surgical procedures as the cause of abnormal reaction of the patient, or of later complication, without mention of misadventure at the time of the procedure: Secondary | ICD-10-CM | POA: Insufficient documentation

## 2011-07-18 DIAGNOSIS — T8189XA Other complications of procedures, not elsewhere classified, initial encounter: Secondary | ICD-10-CM | POA: Insufficient documentation

## 2011-07-18 DIAGNOSIS — T8131XA Disruption of external operation (surgical) wound, not elsewhere classified, initial encounter: Secondary | ICD-10-CM

## 2011-07-18 SURGERY — INCISION AND DRAINAGE, ABSCESS
Anesthesia: General | Site: Elbow | Laterality: Left | Wound class: Dirty or Infected

## 2011-07-18 MED ORDER — FENTANYL CITRATE 0.05 MG/ML IJ SOLN
INTRAMUSCULAR | Status: AC
  Filled 2011-07-18: qty 2

## 2011-07-18 MED ORDER — FENTANYL CITRATE 0.05 MG/ML IJ SOLN
INTRAMUSCULAR | Status: DC | PRN
  Administered 2011-07-18: 50 ug via INTRAVENOUS

## 2011-07-18 MED ORDER — LACTATED RINGERS IV SOLN
INTRAVENOUS | Status: DC
  Administered 2011-07-18: 13:00:00 via INTRAVENOUS

## 2011-07-18 MED ORDER — HYDROMORPHONE HCL 4 MG PO TABS: 4.0000 mg | ORAL_TABLET | Freq: Two times a day (BID) | ORAL | Status: AC | PRN

## 2011-07-18 MED ORDER — MIDAZOLAM HCL 2 MG/2ML IJ SOLN
INTRAMUSCULAR | Status: AC
  Administered 2011-07-18: 2 mg via INTRAVENOUS
  Filled 2011-07-18: qty 2

## 2011-07-18 MED ORDER — EPHEDRINE SULFATE 50 MG/ML IJ SOLN
INTRAMUSCULAR | Status: DC | PRN
  Administered 2011-07-18: 10 mg via INTRAVENOUS

## 2011-07-18 MED ORDER — LIDOCAINE HCL (PF) 1 % IJ SOLN
INTRAMUSCULAR | Status: AC
  Filled 2011-07-18: qty 5

## 2011-07-18 MED ORDER — ONDANSETRON HCL 4 MG/2ML IJ SOLN
INTRAMUSCULAR | Status: AC
  Administered 2011-07-18: 4 mg via INTRAVENOUS
  Filled 2011-07-18: qty 2

## 2011-07-18 MED ORDER — LIDOCAINE HCL 1 % IJ SOLN
INTRAMUSCULAR | Status: DC | PRN
  Administered 2011-07-18: 40 mg via INTRADERMAL

## 2011-07-18 MED ORDER — SODIUM CHLORIDE 0.9 % IR SOLN
Status: DC | PRN
  Administered 2011-07-18 (×2): 1000 mL

## 2011-07-18 MED ORDER — PROPOFOL 10 MG/ML IV EMUL
INTRAVENOUS | Status: AC
  Filled 2011-07-18: qty 20

## 2011-07-18 MED ORDER — FENTANYL CITRATE 0.05 MG/ML IJ SOLN: 25.0000 ug | INTRAMUSCULAR | Status: DC | PRN

## 2011-07-18 MED ORDER — CEFAZOLIN SODIUM 1-5 GM-% IV SOLN
INTRAVENOUS | Status: DC | PRN
  Administered 2011-07-18: 1 g via INTRAVENOUS

## 2011-07-18 MED ORDER — ONDANSETRON HCL 4 MG/2ML IJ SOLN
4.0000 mg | Freq: Once | INTRAMUSCULAR | Status: AC
  Administered 2011-07-18: 4 mg via INTRAVENOUS

## 2011-07-18 MED ORDER — PROPOFOL 10 MG/ML IV BOLUS
INTRAVENOUS | Status: DC | PRN
  Administered 2011-07-18: 15 mg via INTRAVENOUS

## 2011-07-18 MED ORDER — CEFAZOLIN SODIUM 1-5 GM-% IV SOLN
INTRAVENOUS | Status: AC
  Filled 2011-07-18: qty 50

## 2011-07-18 MED ORDER — MIDAZOLAM HCL 2 MG/2ML IJ SOLN
1.0000 mg | INTRAMUSCULAR | Status: DC | PRN
  Administered 2011-07-18: 2 mg via INTRAVENOUS

## 2011-07-18 MED ORDER — MIDAZOLAM HCL 2 MG/2ML IJ SOLN
INTRAMUSCULAR | Status: AC
  Filled 2011-07-18: qty 2

## 2011-07-18 MED ORDER — MIDAZOLAM HCL 5 MG/5ML IJ SOLN
INTRAMUSCULAR | Status: DC | PRN
  Administered 2011-07-18: 2 mg via INTRAVENOUS

## 2011-07-18 MED ORDER — ONDANSETRON HCL 4 MG/2ML IJ SOLN: 4.0000 mg | Freq: Once | INTRAMUSCULAR | Status: DC | PRN

## 2011-07-18 MED ORDER — CEFAZOLIN SODIUM 1-5 GM-% IV SOLN: 1.0000 g | INTRAVENOUS | Status: DC

## 2011-07-18 SURGICAL SUPPLY — 38 items
BAG HAMPER (MISCELLANEOUS) ×2 IMPLANT
BANDAGE CONFORM 2  STR LF (GAUZE/BANDAGES/DRESSINGS) ×1 IMPLANT
BANDAGE ELASTIC 4 VELCRO NS (GAUZE/BANDAGES/DRESSINGS) ×1 IMPLANT
BANDAGE GAUZE ELAST BULKY 4 IN (GAUZE/BANDAGES/DRESSINGS) ×1 IMPLANT
BNDG COHESIVE 4X5 TAN STRL (GAUZE/BANDAGES/DRESSINGS) ×1 IMPLANT
CLOTH BEACON ORANGE TIMEOUT ST (SAFETY) ×2 IMPLANT
COVER MAYO STAND XLG (DRAPE) ×1 IMPLANT
COVER SURGICAL LIGHT HANDLE (MISCELLANEOUS) ×4 IMPLANT
CUFF TOURNIQUET SINGLE 18IN (TOURNIQUET CUFF) ×1 IMPLANT
DRAPE PROXIMA HALF (DRAPES) ×1 IMPLANT
DRSG XEROFORM 1X8 (GAUZE/BANDAGES/DRESSINGS) ×1 IMPLANT
ELECT REM PT RETURN 9FT ADLT (ELECTROSURGICAL) ×2
ELECTRODE REM PT RTRN 9FT ADLT (ELECTROSURGICAL) ×1 IMPLANT
EVACUATOR DRAINAGE 10X20 100CC (DRAIN) IMPLANT
EVACUATOR SILICONE 100CC (DRAIN) ×2
GLOVE ECLIPSE 6.5 STRL STRAW (GLOVE) ×3 IMPLANT
GLOVE ECLIPSE 7.0 STRL STRAW (GLOVE) ×1 IMPLANT
GLOVE SKINSENSE NS SZ8.0 LF (GLOVE) ×1
GLOVE SKINSENSE STRL SZ8.0 LF (GLOVE) IMPLANT
GLOVE SS BIOGEL STRL SZ 6.5 (GLOVE) IMPLANT
GLOVE SS N UNI LF 8.5 STRL (GLOVE) ×1 IMPLANT
GLOVE SUPERSENSE BIOGEL SZ 6.5 (GLOVE) ×1
GOWN STRL REIN XL XLG (GOWN DISPOSABLE) ×8 IMPLANT
KIT ROOM TURNOVER APOR (KITS) ×2 IMPLANT
MANIFOLD NEPTUNE II (INSTRUMENTS) ×2 IMPLANT
MARKER SKIN DUAL TIP RULER LAB (MISCELLANEOUS) ×2 IMPLANT
NS IRRIG 1000ML POUR BTL (IV SOLUTION) ×2 IMPLANT
PACK BASIC LIMB (CUSTOM PROCEDURE TRAY) ×1 IMPLANT
PACK MINOR (CUSTOM PROCEDURE TRAY) IMPLANT
PAD ABD 5X9 TENDERSORB (GAUZE/BANDAGES/DRESSINGS) IMPLANT
PAD ARMBOARD 7.5X6 YLW CONV (MISCELLANEOUS) ×2 IMPLANT
SET BASIN LINEN APH (SET/KITS/TRAYS/PACK) ×2 IMPLANT
SPONGE GAUZE 4X4 12PLY (GAUZE/BANDAGES/DRESSINGS) ×1 IMPLANT
SPONGE LAP 18X18 X RAY DECT (DISPOSABLE) ×1 IMPLANT
SUT ETHILON 3 0 FSL (SUTURE) ×1 IMPLANT
SWAB CULTURE LIQ STUART DBL (MISCELLANEOUS) ×1 IMPLANT
SYR BULB IRRIGATION 50ML (SYRINGE) ×2 IMPLANT
TUBE ANAEROBIC PORT A CUL  W/M (MISCELLANEOUS) ×1 IMPLANT
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
## 2011-07-18 NOTE — Transfer of Care (Signed)
Immediate Anesthesia Transfer of Care Note  Patient: Ryan [name]  Procedure(s) Performed: Procedure(s) (LRB): INCISION AND DRAINAGE ABSCESS (Left)  Patient Location: PACU  Anesthesia Type: General  Level of Consciousness: awake and patient cooperative  Airway & Oxygen Therapy: Patient Spontanous Breathing and Patient connected to face mask oxygen  Post-op Assessment: Report given to PACU RN, Post -op Vital signs reviewed and stable and Patient moving all extremities  Post vital signs: Reviewed and stable  Complications: No apparent anesthesia complications

## 2011-07-18 NOTE — Anesthesia Preprocedure Evaluation (Addendum)
Anesthesia Evaluation  Patient identified by MRN, date of birth, ID band Patient awake    Reviewed: Allergy & Precautions, H&P , NPO status , Patient's Chart, lab work & pertinent test results  Airway Mallampati: II TM Distance: >3 FB Neck ROM: Full    Dental  (+) Edentulous Upper   Pulmonary Current Smoker,    Pulmonary exam normal       Cardiovascular negative cardio ROS  Rhythm:Regular Rate:Normal     Neuro/Psych negative neurological ROS  negative psych ROS   GI/Hepatic negative GI ROS, Neg liver ROS,   Endo/Other  negative endocrine ROS  Renal/GU negative Renal ROS  negative genitourinary   Musculoskeletal   Abdominal Normal abdominal exam  (+)   Peds  Hematology negative hematology ROS (+)   Anesthesia Other Findings   Reproductive/Obstetrics                           Anesthesia Physical Anesthesia Plan  ASA: II  Anesthesia Plan: General   Post-op Pain Management:    Induction: Intravenous  Airway Management Planned: LMA  Additional Equipment:   Intra-op Plan:   Post-operative Plan: Extubation in OR  Informed Consent: I have reviewed the patients History and Physical, chart, labs and discussed the procedure including the risks, benefits and alternatives for the proposed anesthesia with the patient or authorized representative who has indicated his/her understanding and acceptance.     Plan Discussed with:   Anesthesia Plan Comments:         Anesthesia Quick Evaluation  

## 2011-07-18 NOTE — Anesthesia Postprocedure Evaluation (Signed)
  Anesthesia Post-op Note  Patient: Ryan [name]  Procedure(s) Performed: Procedure(s) (LRB): INCISION AND DRAINAGE ABSCESS (Left)  Patient Location: PACU  Anesthesia Type: General  Level of Consciousness: awake, alert , oriented and patient cooperative  Airway and Oxygen Therapy: Patient Spontanous Breathing  Post-op Pain: none  Post-op Assessment: Post-op Vital signs reviewed, Patient's Cardiovascular Status Stable, Respiratory Function Stable, Patent Airway, No signs of Nausea or vomiting and Pain level controlled  Post-op Vital Signs: Reviewed and stable  Complications: No apparent anesthesia complications

## 2011-07-18 NOTE — Discharge Instructions (Signed)
Keep clean and dry. Limit the amount of use of the left upper extremity in terms of bending and straightening the elbow wear the sling to rest the elbow until you come back to the office

## 2011-07-18 NOTE — Anesthesia Procedure Notes (Signed)
Procedure Name: LMA Insertion Date/Time: 07/18/2011 1:27 PM Performed by: Despina Hidden Pre-anesthesia Checklist: Patient identified, Patient being monitored, Emergency Drugs available and Suction available Patient Re-evaluated:Patient Re-evaluated prior to inductionOxygen Delivery Method: Circle system utilized Preoxygenation: Pre-oxygenation with 100% oxygen Intubation Type: IV induction Ventilation: Mask ventilation without difficulty LMA Size: 4.0 Tube type: Oral Number of attempts: 1 Placement Confirmation: breath sounds checked- equal and bilateral and positive ETCO2 Tube secured with: Tape Dental Injury: Teeth and Oropharynx as per pre-operative assessment
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
## 2011-07-18 NOTE — Interval H&P Note (Signed)
History and Physical Interval Note:  07/18/2011 1:12 PM  Ryan [name]  has presented today for surgery, with the diagnosis of postoperative wound breakdown left elbow  The various methods of treatment have been discussed with the patient and family. After consideration of risks, benefits and other options for treatment, the patient has consented to  Procedure(s) (LRB): INCISION AND DRAINAGE ABSCESS (Left) as a surgical intervention .  The patients' history has been reviewed, patient examined, no change in status, stable for surgery.  I have reviewed the patients' chart and labs.  Questions were answered to the patient's satisfaction.     Fuller Canada
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
## 2011-07-21 LAB — CULTURE, ROUTINE-ABSCESS: Gram Stain: NONE SEEN

## 2011-07-22 ENCOUNTER — Ambulatory Visit (INDEPENDENT_AMBULATORY_CARE_PROVIDER_SITE_OTHER): Payer: Medicare Other | Admitting: Orthopedic Surgery

## 2011-07-22 ENCOUNTER — Encounter: Payer: Self-pay | Admitting: Orthopedic Surgery

## 2011-07-22 VITALS — BP 140/80 | Ht 68.0 in | Wt 182.0 lb

## 2011-07-22 DIAGNOSIS — T8131XA Disruption of external operation (surgical) wound, not elsewhere classified, initial encounter: Secondary | ICD-10-CM

## 2011-07-22 MED ORDER — CLINDAMYCIN HCL 300 MG PO CAPS: 300.0000 mg | ORAL_CAPSULE | Freq: Three times a day (TID) | ORAL | Status: AC

## 2011-07-22 NOTE — Progress Notes (Signed)
Patient ID: Ryan [name], male   DOB: [birth year], 66 y.o.   MRN: [id number] Chief Complaint  Patient presents with  . Follow-up    Recheck after surgery on left elbow. DOS 07-18-11.    The repair has completely come apart the sutures pulled through the skin. A remove the drain. His cultures show staph aureus. He is to start on clindamycin 300 mg 3 times a day for 30 days  We will get a wound VAC and tried to close the wound that way. He may need some type of skin flap or skin rotation flap to get this to close. Following that we will have to do an open reduction and internal fixation to stabilize the fibrous nonunion

## 2011-07-22 NOTE — Patient Instructions (Signed)
A wound vac has been ordered for you and an antibiotic has been sent to your pharmacy

## 2011-07-23 LAB — ANAEROBIC CULTURE

## 2011-07-24 ENCOUNTER — Ambulatory Visit (INDEPENDENT_AMBULATORY_CARE_PROVIDER_SITE_OTHER): Payer: Medicare Other | Admitting: Orthopedic Surgery

## 2011-07-24 ENCOUNTER — Encounter: Payer: Self-pay | Admitting: Orthopedic Surgery

## 2011-07-24 VITALS — BP 140/72 | Ht 68.0 in | Wt 182.0 lb

## 2011-07-24 DIAGNOSIS — T8131XA Disruption of external operation (surgical) wound, not elsewhere classified, initial encounter: Secondary | ICD-10-CM

## 2011-07-24 NOTE — Progress Notes (Signed)
Patient ID: Ryan [name], male   DOB: 08-18-1945, 66 y.o.   MRN: [id number] Chief Complaint  Patient presents with  . Dressing Change    left elbow dressing change   S/P I/D wound closure complication wound breakdown   Start wound vac tomorrow   Continue clindaycin

## 2011-07-24 NOTE — Patient Instructions (Signed)
You will receive a wound vac thru [facility]   I will see you in 1 week to eval the wound .  Continue your antibiotics

## 2011-07-25 ENCOUNTER — Telehealth: Payer: Self-pay | Admitting: Orthopedic Surgery

## 2011-07-25 NOTE — Telephone Encounter (Signed)
On 07/24/11, orders had been faxed to [facility] [phone]) for the wound vac/wound care per office visits 07/22/11 and 07/24/11.  Confirmed with Kasandra Knudsen [facility] [phone]) that Fax had been sent; she states she did not receive.  Re-faxed 07/25/11.  * Per Kasandra Knudsen, [facility] - needs wound care/"negative pressure wound therapy"NPWT" form filled out and signed by Dr. Romeo Apple.  She sent form via fax today, 07/25/11.  Form signed and completed and faxed to same (fax # [phone].)  Patient aware orders in progress.  Huntley Dec confirmed that she received the form and that they would be out to see and treat patient today, 07/25/10.  Patient aware.

## 2011-07-30 ENCOUNTER — Telehealth: Payer: Self-pay | Admitting: Orthopedic Surgery

## 2011-07-30 NOTE — Telephone Encounter (Signed)
Dennie Bible, nurse from [facility], ph# [phone], called at 3:56pm 07/30/11 to "relay message to Dr. Romeo Apple."  States had been out to see patient last evening for wound care.  Relays that his wound was more red and hot last evening.  States she is aware that patient is scheduled here tomorrow morning for appointment.  This is first message received.   I had spoken with patient earlier today (a few minutes before noon) to confirm his appointment.  He did not report any problem or discomfort when I asked him how he was doing. Pat, nurse, said she is scheduled to do a follow up to wound care tomorrow evening (07/31/11.)

## 2011-07-31 ENCOUNTER — Encounter: Payer: Self-pay | Admitting: Orthopedic Surgery

## 2011-07-31 ENCOUNTER — Ambulatory Visit (INDEPENDENT_AMBULATORY_CARE_PROVIDER_SITE_OTHER): Payer: Medicare Other | Admitting: Orthopedic Surgery

## 2011-07-31 VITALS — BP 140/70 | Ht 68.0 in | Wt 182.0 lb

## 2011-07-31 DIAGNOSIS — T8131XA Disruption of external operation (surgical) wound, not elsewhere classified, initial encounter: Secondary | ICD-10-CM

## 2011-07-31 NOTE — Patient Instructions (Signed)
Continued antibiotics orally, and continue wound VAC

## 2011-07-31 NOTE — Progress Notes (Signed)
Patient ID: Ryan [name], male   DOB: May 21, 1945, 66 y.o.   MRN: [id number] Chief Complaint  Patient presents with  . Wound Check    wound check left elbow     The wound VAC seems to be doing the trick. The wound has improved in terms of its color. The surrounding erythema has resolved primarily. The wound base is granulating well and there is no drainage.  Continue oral antibiotics and wound VAC

## 2011-08-07 ENCOUNTER — Encounter: Payer: Self-pay | Admitting: Orthopedic Surgery

## 2011-08-07 ENCOUNTER — Ambulatory Visit (INDEPENDENT_AMBULATORY_CARE_PROVIDER_SITE_OTHER): Payer: Medicare Other | Admitting: Orthopedic Surgery

## 2011-08-07 VITALS — BP 120/68 | Ht 68.0 in | Wt 182.0 lb

## 2011-08-07 DIAGNOSIS — T8131XA Disruption of external operation (surgical) wound, not elsewhere classified, initial encounter: Secondary | ICD-10-CM

## 2011-08-07 NOTE — Patient Instructions (Signed)
Continue wound vac

## 2011-08-07 NOTE — Progress Notes (Signed)
Patient ID: Ryan [name], male   DOB: Feb 12, 1946, 66 y.o.   MRN: [id number] Chief Complaint  Patient presents with  . Wound Check    left elbow wound check    Chronic nonhealing wound, LEFT elbow. Currently using a wound VAC.  The wound is 20 mm x 13 mm. It is clean and its base. Continue wound VAC. Followup in 2 weeks

## 2011-08-21 ENCOUNTER — Encounter: Payer: Self-pay | Admitting: Orthopedic Surgery

## 2011-08-21 ENCOUNTER — Ambulatory Visit (INDEPENDENT_AMBULATORY_CARE_PROVIDER_SITE_OTHER): Payer: Medicare Other | Admitting: Orthopedic Surgery

## 2011-08-21 VITALS — BP 116/70 | Ht 68.0 in | Wt 182.0 lb

## 2011-08-21 DIAGNOSIS — T8131XA Disruption of external operation (surgical) wound, not elsewhere classified, initial encounter: Secondary | ICD-10-CM

## 2011-08-21 NOTE — Progress Notes (Signed)
Patient ID: Ryan [name], male   DOB: January 13, 1946, 66 y.o.   MRN: [id number] Chief Complaint  Patient presents with  . Follow-up    2 week recheck wound left elbow DOS: 4/25    BP 116/70  Ht 5\' 8"  (1.727 m)  Wt 182 lb (82.555 kg)  BMI 27.67 kg/m2  Wound 9 x 13 mm, improved from previous visit. Continue wound VAC, return in 2 weeks

## 2011-08-21 NOTE — Patient Instructions (Signed)
Continue wound VAC

## 2011-08-28 ENCOUNTER — Encounter: Payer: Self-pay | Admitting: Orthopedic Surgery

## 2011-08-28 ENCOUNTER — Telehealth: Payer: Self-pay | Admitting: Orthopedic Surgery

## 2011-08-28 ENCOUNTER — Ambulatory Visit (INDEPENDENT_AMBULATORY_CARE_PROVIDER_SITE_OTHER): Payer: Medicare Other | Admitting: Orthopedic Surgery

## 2011-08-28 VITALS — BP 144/80 | Ht 68.0 in | Wt 182.0 lb

## 2011-08-28 DIAGNOSIS — T8131XA Disruption of external operation (surgical) wound, not elsewhere classified, initial encounter: Secondary | ICD-10-CM

## 2011-08-28 NOTE — Progress Notes (Signed)
Patient ID: Ryan [name], male   DOB: 10/10/1945, 66 y.o.   MRN: [id number] Chief Complaint  Patient presents with  . Wound Check    check wound left elbow    Surgery was April 25  The nurse felt that the wound had an Odor. Despite no erythema no drainage. So he came in today for his visit is no odor. Wound looks good wound is 12 x 6 mm drinking continue to follow

## 2011-08-28 NOTE — Telephone Encounter (Signed)
Dennie Bible, nurse from [facility], ph# [phone], called today, 08/28/11, following visit with patient 08/26/11.  States wound had odor when she pulled wound vac; states no drainage, patient has no fever; said "tissue looks great."  States she cleaned wound very well, but odor still noted.  States patient just relayed to her that day that he has had diarrhea and has lost 8 lbs.  I scheduled appointment for patient today, 08/28/11.  Patient aware.

## 2011-08-28 NOTE — Patient Instructions (Addendum)
Continue wound vac

## 2011-09-04 ENCOUNTER — Ambulatory Visit: Payer: Medicare Other | Admitting: Orthopedic Surgery

## 2011-09-11 ENCOUNTER — Ambulatory Visit (INDEPENDENT_AMBULATORY_CARE_PROVIDER_SITE_OTHER): Payer: Medicare Other | Admitting: Orthopedic Surgery

## 2011-09-11 ENCOUNTER — Encounter: Payer: Self-pay | Admitting: Orthopedic Surgery

## 2011-09-11 VITALS — Ht 68.0 in | Wt 182.0 lb

## 2011-09-11 DIAGNOSIS — T8131XA Disruption of external operation (surgical) wound, not elsewhere classified, initial encounter: Secondary | ICD-10-CM

## 2011-09-11 NOTE — Progress Notes (Signed)
Patient ID: BRAIDAN RICCIARDI, male   DOB: 1945/09/07, 66 y.o.   MRN: [id number] Chief Complaint  Patient presents with  . Follow-up    2 week recheck on left elbow.   Ht 5\' 8"  (1.727 m)  Wt 182 lb (82.555 kg)  BMI 27.67 kg/m2  The incision has almost closed no surrounding erythema. No drainage.  Advised to continue with Neosporin and Band-Aid until it fully closes come back in 4 weeks for reexamination

## 2011-09-11 NOTE — Patient Instructions (Addendum)
Neosporin twice a day  Cover with band aid

## 2011-10-09 ENCOUNTER — Ambulatory Visit (INDEPENDENT_AMBULATORY_CARE_PROVIDER_SITE_OTHER): Payer: Medicare Other | Admitting: Orthopedic Surgery

## 2011-10-09 ENCOUNTER — Encounter: Payer: Self-pay | Admitting: Orthopedic Surgery

## 2011-10-09 VITALS — BP 122/80 | Ht 68.0 in | Wt 182.0 lb

## 2011-10-09 DIAGNOSIS — IMO0002 Reserved for concepts with insufficient information to code with codable children: Secondary | ICD-10-CM

## 2011-10-09 DIAGNOSIS — M7020 Olecranon bursitis, unspecified elbow: Secondary | ICD-10-CM

## 2011-10-09 DIAGNOSIS — M7030 Other bursitis of elbow, unspecified elbow: Secondary | ICD-10-CM

## 2011-10-09 DIAGNOSIS — T8131XA Disruption of external operation (surgical) wound, not elsewhere classified, initial encounter: Secondary | ICD-10-CM

## 2011-10-09 NOTE — Progress Notes (Signed)
Patient ID: Ryan [name], male   DOB: Oct 05, 1945, 66 y.o.   MRN: [id number] Chief Complaint  Patient presents with  . Follow-up    4 week recheck left elbow, DOS 07/17/11    BP 122/80  Ht 5\' 8"  (1.727 m)  Wt 182 lb (82.555 kg)  BMI 27.67 kg/m2  Left elbow wound healed  ROM restored

## 2011-10-09 NOTE — Patient Instructions (Addendum)
activities as tolerated 

## 2011-11-14 ENCOUNTER — Ambulatory Visit (INDEPENDENT_AMBULATORY_CARE_PROVIDER_SITE_OTHER): Payer: Medicare Other

## 2011-11-14 ENCOUNTER — Encounter: Payer: Self-pay | Admitting: Orthopedic Surgery

## 2011-11-14 ENCOUNTER — Ambulatory Visit (INDEPENDENT_AMBULATORY_CARE_PROVIDER_SITE_OTHER): Payer: Medicare Other | Admitting: Orthopedic Surgery

## 2011-11-14 VITALS — BP 130/80 | Ht 68.0 in | Wt 182.0 lb

## 2011-11-14 DIAGNOSIS — Z96659 Presence of unspecified artificial knee joint: Secondary | ICD-10-CM

## 2011-11-14 NOTE — Progress Notes (Signed)
Patient ID: Ryan [name], male   DOB: Apr 26, 1945, 66 y.o.   MRN: [id number] IMPLANTS USED: [employer] Triathlon size 5 PS femur, size 6 tibial  baseplate, a size 6, 11 mm tibial insert, size 29 offset dome 9 mm thick  patella. Tobramycin-laden, Simplex-P cement. Chief Complaint  Patient presents with  . Follow-up    1 year recheck on left knee replacement.    DOS 09-02-2006  Chief complaint total knee follow-up.  History this is a follow-up visit. Status post LEFT  total knee replacement.  Review of systems patient has no complaints.  Exam Physical Exam(6) GENERAL: normal development   CDV: pulses are normal   Skin: normal  Psychiatric: awake, alert and oriented  Neuro: normal sensation  1 ambulation NO SUPPORT  2 ROM = 125 3 Motor normal  4 Stability normal   Separate x-ray report.  Reason for x-ray, and we'll x-ray follow-up knee replacement.  3 views LEFT knee.  The implant is aligned normally. There is no loosening.  Impression normal appearing knee replacement.    Assessment: Knee replacement functioning well    Plan: One year follow

## 2011-11-14 NOTE — Patient Instructions (Signed)
activities as tolerated 

## 2012-02-18 ENCOUNTER — Ambulatory Visit (INDEPENDENT_AMBULATORY_CARE_PROVIDER_SITE_OTHER): Payer: Medicare Other | Admitting: Orthopedic Surgery

## 2012-02-18 ENCOUNTER — Other Ambulatory Visit: Payer: Self-pay | Admitting: Orthopedic Surgery

## 2012-02-18 ENCOUNTER — Ambulatory Visit (HOSPITAL_COMMUNITY)
Admission: RE | Admit: 2012-02-18 | Discharge: 2012-02-18 | Disposition: A | Discharge status: Home / Self Care | Payer: Medicare Other | Source: Ambulatory Visit | Attending: Orthopedic Surgery | Admitting: Orthopedic Surgery

## 2012-02-18 ENCOUNTER — Encounter: Payer: Self-pay | Admitting: Orthopedic Surgery

## 2012-02-18 DIAGNOSIS — M249 Joint derangement, unspecified: Secondary | ICD-10-CM

## 2012-02-18 DIAGNOSIS — M24829 Other specific joint derangements of unspecified elbow, not elsewhere classified: Secondary | ICD-10-CM

## 2012-02-18 MED ORDER — TRAMADOL HCL 50 MG PO TABS
50.0000 mg | ORAL_TABLET | Freq: Four times a day (QID) | ORAL | Status: DC | PRN
Start: 2012-02-18 — End: 2012-10-06

## 2012-02-18 NOTE — Patient Instructions (Signed)
Sling   Rest

## 2012-02-18 NOTE — Progress Notes (Signed)
Patient ID: Ryan [name], male   DOB: 04/24/1945, 66 y.o.   MRN: [id number] Chief Complaint  Patient presents with  . Elbow Pain    LEFT elbow locking, severe pain       This is a 66 year old male, status post multiple procedures on his LEFT elbow, which began approximately 30 years ago with a severe traumatic injury to the LEFT elbow, which was treated with open treatment internal fixation, followed by hardware removal. He subsequently developed multiple episodes of bursitis, having had excision completed by infection and Separation of a fibrous nonunion. He did recover after treatment with the wound VAC for the infection and now presents with a one-day history of locking of the LEFT elbow and severe pain with swelling.  He denies fever, temperature or warmth, or cellulitis/redness around the elbow.  Physical Exam(6) GENERAL: normal development   CDV: pulses are normal   Skin: normal  Psychiatric: awake, alert and oriented  Neuro: normal sensation  MSK Gait:  Ambulation is normal 1 LEFT elbow, tenderness over the fibrous union, as well as the anterior elbow joint 2 The arc of motion is 30-100 3 Stability tests are difficult, but the elbow seems stable 4 Extension power is normal  Imaging: There appears to be a loose body in the front of the elbow. 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 Reevaluate possible need for CT

## 2012-03-03 ENCOUNTER — Ambulatory Visit (INDEPENDENT_AMBULATORY_CARE_PROVIDER_SITE_OTHER): Payer: Medicare Other | Admitting: Orthopedic Surgery

## 2012-03-03 VITALS — BP 150/80 | Ht 68.0 in | Wt 182.0 lb

## 2012-03-03 DIAGNOSIS — M249 Joint derangement, unspecified: Secondary | ICD-10-CM

## 2012-03-03 DIAGNOSIS — M24829 Other specific joint derangements of unspecified elbow, not elsewhere classified: Secondary | ICD-10-CM

## 2012-03-03 DIAGNOSIS — M24022 Loose body in left elbow: Secondary | ICD-10-CM

## 2012-03-03 DIAGNOSIS — M24029 Loose body in unspecified elbow: Secondary | ICD-10-CM

## 2012-03-03 NOTE — Patient Instructions (Signed)
You have been scheduled for an MRI scan.  Your insurance company requires a precertification prior to scheduling the MRI.  If the MRI scan is not approved we will let you know and make further treatment recommendations according to your insurance's guidelines.   We will schedule you for another  appointment to review the results and make further treatment recommendations  

## 2012-03-03 NOTE — Progress Notes (Signed)
Patient ID: Ryan [name], male   DOB: [birth year], 66 y.o.   MRN: [id number] 1. Loose body of left elbow  CT Elbow Left W/O Cm  2. Elbow locking      The patient had traumatic injury to the left elbow 40 years ago had a complex reconstruction did well for about 35 years and then started having elbow swelling which was thought to be bursitis after several aspirations the bursal sac was excised and he did well for several months and then it returned upon further review it was noted that he had a fibrous nonunion of the olecranon which was communicating with the joint so the elbow was then treated with bone grafting.   No fever, chills, numbness or tingling   He presented 2 weeks ago with a locked elbow and severe pain he was treated with rest, immobilization and pain medication presents back with improved range of motion continued pain and swelling  Exam shows tenderness over the entire elbow joint which is diffuse there is swelling especially anteriorly which is probably fluid. Today range of motion was 35-105.   Recommend CT scan evaluate joint for loose body and then plan on referral for possible elbow replacement versus elbow fusion

## 2012-03-05 ENCOUNTER — Telehealth: Payer: Self-pay | Admitting: Radiology

## 2012-03-05 NOTE — Telephone Encounter (Signed)
Patient has appointment at [facility] for a CT scan on 03-11-12 at 10:45. Patient has Medicare, no precert is needed. Patient will follow up back here in the office for his results.

## 2012-03-11 ENCOUNTER — Ambulatory Visit (HOSPITAL_COMMUNITY)
Admission: RE | Admit: 2012-03-11 | Discharge: 2012-03-11 | Disposition: A | Discharge status: Home / Self Care | Payer: Medicare Other | Source: Ambulatory Visit | Attending: Orthopedic Surgery | Admitting: Orthopedic Surgery

## 2012-03-11 DIAGNOSIS — M24022 Loose body in left elbow: Secondary | ICD-10-CM

## 2012-03-11 DIAGNOSIS — M24029 Loose body in unspecified elbow: Secondary | ICD-10-CM | POA: Insufficient documentation

## 2012-03-11 DIAGNOSIS — M25429 Effusion, unspecified elbow: Secondary | ICD-10-CM | POA: Insufficient documentation

## 2012-03-11 DIAGNOSIS — M25539 Pain in unspecified wrist: Secondary | ICD-10-CM | POA: Insufficient documentation

## 2012-03-17 ENCOUNTER — Ambulatory Visit (INDEPENDENT_AMBULATORY_CARE_PROVIDER_SITE_OTHER): Payer: Medicare Other | Admitting: Orthopedic Surgery

## 2012-03-17 ENCOUNTER — Encounter: Payer: Self-pay | Admitting: Orthopedic Surgery

## 2012-03-17 VITALS — BP 140/88 | Ht 68.0 in | Wt 182.0 lb

## 2012-03-17 DIAGNOSIS — M19029 Primary osteoarthritis, unspecified elbow: Secondary | ICD-10-CM

## 2012-03-17 NOTE — Patient Instructions (Addendum)
Referral to [facility] for Possible Elbow replacement   Sling as needed  Continue current pain medication

## 2012-03-17 NOTE — Progress Notes (Signed)
Patient ID: Ryan [name], male   DOB: 1945/09/16, 66 y.o.   MRN: [id number]   Chief Complaint  Patient presents with  . Follow-up    CT results follow up left elbow/locked elbow    Current Outpatient Prescriptions on File Prior to Visit  Medication Sig Dispense Refill  . ibuprofen (ADVIL,MOTRIN) 200 MG tablet Take 800 mg by mouth every 6 (six) hours as needed. pain      . traMADol (ULTRAM) 50 MG tablet Take 1 tablet (50 mg total) by mouth every 6 (six) hours as needed for pain.  60 tablet  5    Pain less   But upset that cant do the things he wants to do   PE: pain, crepitance, with ROM   Elbow arthritis loose bodies left elbow    IMPRESSION:  1. Multiple loose bodies about the elbow. The largest represents  a nonunion of the olecranon with large cysts in both the olecranon  fragment and the proximal ulna. 1 cm loose body in the anterior  joint.  2. Large effusion.  3. 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  4. Attenuation of the triceps tendon without frank tear. This  likely represents disuse atrophy.

## 2012-03-30 ENCOUNTER — Telehealth: Payer: Self-pay | Admitting: *Deleted

## 2012-03-30 NOTE — Telephone Encounter (Signed)
Faxed referral to [facility], awaiting appointment.

## 2012-04-13 ENCOUNTER — Other Ambulatory Visit: Payer: Self-pay | Admitting: *Deleted

## 2012-04-13 DIAGNOSIS — M25429 Effusion, unspecified elbow: Secondary | ICD-10-CM

## 2012-04-16 ENCOUNTER — Telehealth: Payer: Self-pay | Admitting: Orthopedic Surgery

## 2012-04-16 NOTE — Telephone Encounter (Signed)
Patient came by, states he has the referral appointment scheduled at [facility] with Dr. Amanda Pea for Saturday 04/18/12, 8:00a.m.  Said that all records, films are needed.  Please advise patient if he needs anything further from our office, or if all has been sent to Dr. Amanda Pea.   Patient ph [phone] (Home)

## 2012-04-17 NOTE — Telephone Encounter (Signed)
Called patient and left 3 messages. The last message I advised patient that he must go by [facility] to pick up his films to take with him to his appointment with Dr. Amanda Pea on 04/18/12.

## 2012-06-16 ENCOUNTER — Ambulatory Visit: Payer: Medicare Other | Admitting: Orthopedic Surgery

## 2012-08-13 ENCOUNTER — Telehealth: Payer: Self-pay | Admitting: Orthopedic Surgery

## 2012-08-13 NOTE — Telephone Encounter (Signed)
Per patient's request, medical records pertaining to his left elbow were faxed to Dr. Deborra Medina, ATTN: Dara at [facility] in [place]. Faxed to # [phone].  Ph. # is  [phone].

## 2012-08-18 ENCOUNTER — Ambulatory Visit (INDEPENDENT_AMBULATORY_CARE_PROVIDER_SITE_OTHER): Payer: Medicare Other | Admitting: Orthopedic Surgery

## 2012-08-18 ENCOUNTER — Encounter: Payer: Self-pay | Admitting: Orthopedic Surgery

## 2012-08-18 ENCOUNTER — Ambulatory Visit (INDEPENDENT_AMBULATORY_CARE_PROVIDER_SITE_OTHER): Payer: Medicare Other

## 2012-08-18 VITALS — BP 128/82 | Ht 68.0 in | Wt 182.0 lb

## 2012-08-18 DIAGNOSIS — M009 Pyogenic arthritis, unspecified: Secondary | ICD-10-CM

## 2012-08-18 DIAGNOSIS — S52023A Displaced fracture of olecranon process without intraarticular extension of unspecified ulna, initial encounter for closed fracture: Secondary | ICD-10-CM | POA: Insufficient documentation

## 2012-08-18 DIAGNOSIS — S52022K Displaced fracture of olecranon process without intraarticular extension of left ulna, subsequent encounter for closed fracture with nonunion: Secondary | ICD-10-CM

## 2012-08-18 DIAGNOSIS — IMO0002 Reserved for concepts with insufficient information to code with codable children: Secondary | ICD-10-CM

## 2012-08-18 MED ORDER — SULFAMETHOXAZOLE-TRIMETHOPRIM 800-160 MG PO TABS: 1.0000 | ORAL_TABLET | Freq: Two times a day (BID) | ORAL | Status: DC

## 2012-08-18 NOTE — Patient Instructions (Signed)
Surgery 08/24/12
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
## 2012-08-19 NOTE — Patient Instructions (Addendum)
    Ryan [name]  08/19/2012   Your procedure is scheduled on:   08/24/2012  Report to [facility] at  825  AM.  Call this number if you have problems the morning of surgery: [phone]   Remember:   Do not eat food or drink liquids after midnight.   Take these medicines the morning of surgery with A SIP OF WATER: ultram   Do not wear jewelry, make-up or nail polish.  Do not wear lotions, powders, or perfumes.   Do not shave 48 hours prior to surgery. Men may shave face and neck.  Do not bring valuables to the hospital.  Contacts, dentures or bridgework may not be worn into surgery.  Leave suitcase in the car. After surgery it may be brought to your room.  For patients admitted to the hospital, checkout time is 11:00 AM the day of discharge.   Patients discharged the day of surgery will not be allowed to drive  home.  Name and phone number of your driver: family  Special Instructions: Shower using CHG 2 nights before surgery and the night before surgery.  If you shower the day of surgery use CHG.  Use special wash - you have one bottle of CHG for all showers.  You should use approximately 1/3 of the bottle for each shower.   Please read over the following fact sheets that you were given: Pain Booklet, Coughing and Deep Breathing, MRSA Information, Surgical Site Infection Prevention, Anesthesia Post-op Instructions and Care and Recovery After Surgery PATIENT INSTRUCTIONS POST-ANESTHESIA  IMMEDIATELY FOLLOWING SURGERY:  Do not drive or operate machinery for the first twenty four hours after surgery.  Do not make any important decisions for twenty four hours after surgery or while taking narcotic pain medications or sedatives.  If you develop intractable nausea and vomiting or a severe headache please notify your doctor immediately.  FOLLOW-UP:  Please make an appointment with your surgeon as instructed. You do not need to follow up with anesthesia unless specifically instructed to do  so.  WOUND CARE INSTRUCTIONS (if applicable):  Keep a dry clean dressing on the anesthesia/puncture wound site if there is drainage.  Once the wound has quit draining you may leave it open to air.  Generally you should leave the bandage intact for twenty four hours unless there is drainage.  If the epidural site drains for more than 36-48 hours please call the anesthesia department.  QUESTIONS?:  Please feel free to call your physician or the hospital operator if you have any questions, and they will be happy to assist you.

## 2012-08-20 ENCOUNTER — Encounter (HOSPITAL_COMMUNITY): Payer: Self-pay | Admitting: Pharmacy Technician

## 2012-08-20 ENCOUNTER — Other Ambulatory Visit: Payer: Self-pay

## 2012-08-20 ENCOUNTER — Encounter (HOSPITAL_COMMUNITY): Payer: Self-pay

## 2012-08-20 ENCOUNTER — Encounter (HOSPITAL_COMMUNITY)
Admission: RE | Admit: 2012-08-20 | Discharge: 2012-08-20 | Disposition: A | Discharge status: Home / Self Care | Payer: Medicare Other | Source: Ambulatory Visit | Attending: Orthopedic Surgery | Admitting: Orthopedic Surgery

## 2012-08-20 LAB — CBC WITH DIFFERENTIAL/PLATELET
Basophils Absolute: 0.1 10*3/uL (ref 0.0–0.1)
Basophils Relative: 1 % (ref 0–1)
Eosinophils Relative: 2 % (ref 0–5)
HCT: 41.7 % (ref 39.0–52.0)
Lymphocytes Relative: 16 % (ref 12–46)
MCH: 31.1 pg (ref 26.0–34.0)
MCHC: 33.8 g/dL (ref 30.0–36.0)
MCV: 91.9 fL (ref 78.0–100.0)
Monocytes Absolute: 1.4 10*3/uL — ABNORMAL HIGH (ref 0.1–1.0)
RDW: 12.5 % (ref 11.5–15.5)

## 2012-08-20 LAB — C-REACTIVE PROTEIN: CRP: 11 mg/dL — ABNORMAL HIGH (ref <0.60)

## 2012-08-20 LAB — SURGICAL PCR SCREEN: Staphylococcus aureus: NEGATIVE

## 2012-08-20 LAB — BASIC METABOLIC PANEL
CO2: 27 mEq/L (ref 19–32)
Calcium: 9.5 mg/dL (ref 8.4–10.5)
Creatinine, Ser: 1.53 mg/dL — ABNORMAL HIGH (ref 0.50–1.35)
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
## 2012-08-24 ENCOUNTER — Ambulatory Visit (HOSPITAL_COMMUNITY)
Admission: RE | Admit: 2012-08-24 | Discharge: 2012-08-24 | Disposition: A | Discharge status: Home / Self Care | Payer: Medicare Other | Source: Ambulatory Visit | Attending: Orthopedic Surgery | Admitting: Orthopedic Surgery

## 2012-08-24 ENCOUNTER — Encounter (HOSPITAL_COMMUNITY): Payer: Self-pay | Admitting: Anesthesiology

## 2012-08-24 ENCOUNTER — Encounter (HOSPITAL_COMMUNITY): Payer: Self-pay | Admitting: *Deleted

## 2012-08-24 ENCOUNTER — Encounter (HOSPITAL_COMMUNITY)
Admission: RE | Disposition: A | Discharge status: Home / Self Care | Payer: Self-pay | Source: Ambulatory Visit | Attending: Orthopedic Surgery

## 2012-08-24 ENCOUNTER — Ambulatory Visit (HOSPITAL_COMMUNITY): Payer: Medicare Other | Admitting: Anesthesiology

## 2012-08-24 DIAGNOSIS — M009 Pyogenic arthritis, unspecified: Secondary | ICD-10-CM

## 2012-08-24 DIAGNOSIS — Z01810 Encounter for preprocedural cardiovascular examination: Secondary | ICD-10-CM | POA: Insufficient documentation

## 2012-08-24 DIAGNOSIS — L089 Local infection of the skin and subcutaneous tissue, unspecified: Secondary | ICD-10-CM

## 2012-08-24 DIAGNOSIS — Z01812 Encounter for preprocedural laboratory examination: Secondary | ICD-10-CM | POA: Insufficient documentation

## 2012-08-24 DIAGNOSIS — L988 Other specified disorders of the skin and subcutaneous tissue: Secondary | ICD-10-CM

## 2012-08-24 DIAGNOSIS — IMO0002 Reserved for concepts with insufficient information to code with codable children: Secondary | ICD-10-CM

## 2012-08-24 DIAGNOSIS — Z79899 Other long term (current) drug therapy: Secondary | ICD-10-CM | POA: Insufficient documentation

## 2012-08-24 HISTORY — PX: ORIF ELBOW FRACTURE: SHX5031

## 2012-08-24 HISTORY — PX: INCISION AND DRAINAGE: SHX5863

## 2012-08-24 LAB — POCT I-STAT 4, (NA,K, GLUC, HGB,HCT)
Hemoglobin: 14.6 g/dL (ref 13.0–17.0)
Sodium: 141 mEq/L (ref 135–145)

## 2012-08-24 SURGERY — OPEN REDUCTION INTERNAL FIXATION (ORIF) ELBOW/OLECRANON FRACTURE
Anesthesia: General | Site: Elbow | Laterality: Left | Wound class: Dirty or Infected

## 2012-08-24 MED ORDER — ACETAMINOPHEN 500 MG PO TABS
500.0000 mg | ORAL_TABLET | Freq: Once | ORAL | Status: AC
  Administered 2012-08-24: 500 mg via ORAL

## 2012-08-24 MED ORDER — MIDAZOLAM HCL 2 MG/2ML IJ SOLN
1.0000 mg | INTRAMUSCULAR | Status: DC | PRN
  Administered 2012-08-24 (×2): 2 mg via INTRAVENOUS

## 2012-08-24 MED ORDER — PROMETHAZINE HCL 12.5 MG PO TABS: 12.5000 mg | ORAL_TABLET | Freq: Four times a day (QID) | ORAL | Status: DC | PRN

## 2012-08-24 MED ORDER — FENTANYL CITRATE 0.05 MG/ML IJ SOLN
INTRAMUSCULAR | Status: DC | PRN
  Administered 2012-08-24: 25 ug via INTRAVENOUS
  Administered 2012-08-24: 50 ug via INTRAVENOUS
  Administered 2012-08-24 (×7): 25 ug via INTRAVENOUS

## 2012-08-24 MED ORDER — CEFAZOLIN SODIUM-DEXTROSE 2-3 GM-% IV SOLR
2.0000 g | INTRAVENOUS | Status: AC
  Administered 2012-08-24: 2 g via INTRAVENOUS

## 2012-08-24 MED ORDER — MIDAZOLAM HCL 2 MG/2ML IJ SOLN
INTRAMUSCULAR | Status: AC
  Filled 2012-08-24: qty 2

## 2012-08-24 MED ORDER — ONDANSETRON HCL 4 MG/2ML IJ SOLN
INTRAMUSCULAR | Status: AC
  Filled 2012-08-24: qty 2

## 2012-08-24 MED ORDER — SODIUM CHLORIDE 0.9 % IR SOLN
Status: DC | PRN
  Administered 2012-08-24 (×2): 1000 mL

## 2012-08-24 MED ORDER — HYDROMORPHONE HCL 4 MG PO TABS: 4.0000 mg | ORAL_TABLET | ORAL | Status: DC | PRN

## 2012-08-24 MED ORDER — ONDANSETRON HCL 4 MG/2ML IJ SOLN
4.0000 mg | Freq: Once | INTRAMUSCULAR | Status: AC
  Administered 2012-08-24: 4 mg via INTRAVENOUS

## 2012-08-24 MED ORDER — GLYCOPYRROLATE 0.2 MG/ML IJ SOLN
0.2000 mg | Freq: Once | INTRAMUSCULAR | Status: AC
  Administered 2012-08-24: 0.2 mg via INTRAVENOUS

## 2012-08-24 MED ORDER — PROPOFOL 10 MG/ML IV BOLUS
INTRAVENOUS | Status: DC | PRN
  Administered 2012-08-24: 150 mg via INTRAVENOUS

## 2012-08-24 MED ORDER — BUPIVACAINE-EPINEPHRINE PF 0.5-1:200000 % IJ SOLN
INTRAMUSCULAR | Status: DC | PRN
  Administered 2012-08-24: 30 mL

## 2012-08-24 MED ORDER — FENTANYL CITRATE 0.05 MG/ML IJ SOLN: 25.0000 ug | INTRAMUSCULAR | Status: DC | PRN

## 2012-08-24 MED ORDER — HYDROMORPHONE HCL 2 MG PO TABS
2.0000 mg | ORAL_TABLET | Freq: Once | ORAL | Status: AC
  Administered 2012-08-24: 2 mg via ORAL

## 2012-08-24 MED ORDER — CHLORHEXIDINE GLUCONATE 4 % EX LIQD
60.0000 mL | Freq: Once | CUTANEOUS | Status: AC
  Administered 2012-08-24: 4 via TOPICAL

## 2012-08-24 MED ORDER — SUCCINYLCHOLINE CHLORIDE 20 MG/ML IJ SOLN
INTRAMUSCULAR | Status: AC
  Filled 2012-08-24: qty 1

## 2012-08-24 MED ORDER — ACETAMINOPHEN 500 MG PO TABS
ORAL_TABLET | ORAL | Status: AC
  Filled 2012-08-24: qty 1

## 2012-08-24 MED ORDER — MIDAZOLAM HCL 5 MG/5ML IJ SOLN
INTRAMUSCULAR | Status: DC | PRN
  Administered 2012-08-24: 2 mg via INTRAVENOUS

## 2012-08-24 MED ORDER — GLYCOPYRROLATE 0.2 MG/ML IJ SOLN
INTRAMUSCULAR | Status: AC
  Filled 2012-08-24: qty 1

## 2012-08-24 MED ORDER — ONDANSETRON HCL 4 MG/2ML IJ SOLN
4.0000 mg | Freq: Once | INTRAMUSCULAR | Status: AC | PRN
  Administered 2012-08-24: 4 mg via INTRAVENOUS

## 2012-08-24 MED ORDER — ROCURONIUM BROMIDE 50 MG/5ML IV SOLN
INTRAVENOUS | Status: AC
  Filled 2012-08-24: qty 1

## 2012-08-24 MED ORDER — LIDOCAINE HCL (CARDIAC) 10 MG/ML IV SOLN
INTRAVENOUS | Status: DC | PRN
  Administered 2012-08-24: 50 mg via INTRAVENOUS

## 2012-08-24 MED ORDER — PROPOFOL 10 MG/ML IV EMUL
INTRAVENOUS | Status: AC
  Filled 2012-08-24: qty 20

## 2012-08-24 MED ORDER — LIDOCAINE HCL (PF) 1 % IJ SOLN
INTRAMUSCULAR | Status: AC
  Filled 2012-08-24: qty 5

## 2012-08-24 MED ORDER — KETOROLAC TROMETHAMINE 30 MG/ML IJ SOLN
30.0000 mg | Freq: Once | INTRAMUSCULAR | Status: AC
  Administered 2012-08-24: 30 mg via INTRAVENOUS

## 2012-08-24 MED ORDER — CEFAZOLIN SODIUM-DEXTROSE 2-3 GM-% IV SOLR
INTRAVENOUS | Status: AC
  Filled 2012-08-24: qty 50

## 2012-08-24 MED ORDER — BUPIVACAINE HCL (PF) 0.5 % IJ SOLN
INTRAMUSCULAR | Status: AC
  Filled 2012-08-24: qty 60

## 2012-08-24 MED ORDER — FENTANYL CITRATE 0.05 MG/ML IJ SOLN
INTRAMUSCULAR | Status: AC
  Filled 2012-08-24: qty 5

## 2012-08-24 MED ORDER — KETOROLAC TROMETHAMINE 30 MG/ML IJ SOLN
INTRAMUSCULAR | Status: AC
  Filled 2012-08-24: qty 1

## 2012-08-24 MED ORDER — LACTATED RINGERS IV SOLN
INTRAVENOUS | Status: DC
  Administered 2012-08-24 (×2): via INTRAVENOUS

## 2012-08-24 MED ORDER — HYDROMORPHONE HCL 2 MG PO TABS
ORAL_TABLET | ORAL | Status: AC
  Filled 2012-08-24: qty 1

## 2012-08-24 SURGICAL SUPPLY — 73 items
BAG HAMPER (MISCELLANEOUS) ×2 IMPLANT
BANDAGE CONFORM 2  STR LF (GAUZE/BANDAGES/DRESSINGS) IMPLANT
BANDAGE ELASTIC 4 VELCRO NS (GAUZE/BANDAGES/DRESSINGS) ×3 IMPLANT
BANDAGE ELASTIC 6 VELCRO ST LF (GAUZE/BANDAGES/DRESSINGS) IMPLANT
BANDAGE ESMARK 4X12 BL STRL LF (DISPOSABLE) ×1 IMPLANT
BANDAGE GAUZE ELAST BULKY 4 IN (GAUZE/BANDAGES/DRESSINGS) ×1 IMPLANT
BIT DRILL 2.0X128 (BIT) ×1 IMPLANT
BLADE SURG SZ10 CARB STEEL (BLADE) ×4 IMPLANT
BNDG CMPR 12X4 ELC STRL LF (DISPOSABLE) ×1
BNDG CMPR MD 5X2 ELC HKLP STRL (GAUZE/BANDAGES/DRESSINGS)
BNDG COHESIVE 4X5 TAN NS LF (GAUZE/BANDAGES/DRESSINGS) ×3 IMPLANT
BNDG ELASTIC 2 VLCR STRL LF (GAUZE/BANDAGES/DRESSINGS) IMPLANT
BNDG ESMARK 4X12 BLUE STRL LF (DISPOSABLE) ×2
CAP PIN PROTECTOR ORTHO WHT (CAP) IMPLANT
CHLORAPREP W/TINT 26ML (MISCELLANEOUS) ×2 IMPLANT
CLOTH BEACON ORANGE TIMEOUT ST (SAFETY) ×2 IMPLANT
COVER LIGHT HANDLE STERIS (MISCELLANEOUS) ×4 IMPLANT
COVER MAYO STAND XLG (DRAPE) ×2 IMPLANT
CUFF TOURNIQUET SINGLE 18IN (TOURNIQUET CUFF) ×2 IMPLANT
DECANTER SPIKE VIAL GLASS SM (MISCELLANEOUS) ×2 IMPLANT
DRAPE C-ARM FOLDED MOBILE STRL (DRAPES) ×2 IMPLANT
DRAPE INCISE IOBAN 44X35 STRL (DRAPES) IMPLANT
DRSG ALLEVYN 2X2 (GAUZE/BANDAGES/DRESSINGS) IMPLANT
ELECT REM PT RETURN 9FT ADLT (ELECTROSURGICAL) ×2
ELECTRODE REM PT RTRN 9FT ADLT (ELECTROSURGICAL) ×1 IMPLANT
EVACUATOR 3/16  PVC DRAIN (DRAIN) ×1
EVACUATOR 3/16 PVC DRAIN (DRAIN) IMPLANT
GAUZE XEROFORM 5X9 LF (GAUZE/BANDAGES/DRESSINGS) ×2 IMPLANT
GLOVE BIOGEL PI IND STRL 7.0 (GLOVE) IMPLANT
GLOVE BIOGEL PI INDICATOR 7.0 (GLOVE) ×2
GLOVE ECLIPSE 6.5 STRL STRAW (GLOVE) ×1 IMPLANT
GLOVE EXAM NITRILE MD LF STRL (GLOVE) ×1 IMPLANT
GLOVE SKINSENSE NS SZ8.0 LF (GLOVE) ×1
GLOVE SKINSENSE STRL SZ8.0 LF (GLOVE) ×1 IMPLANT
GLOVE SS BIOGEL STRL SZ 6.5 (GLOVE) IMPLANT
GLOVE SS N UNI LF 8.5 STRL (GLOVE) ×2 IMPLANT
GLOVE SUPERSENSE BIOGEL SZ 6.5 (GLOVE) ×1
GOWN STRL REIN XL XLG (GOWN DISPOSABLE) ×7 IMPLANT
INST SET MINOR BONE (KITS) ×2 IMPLANT
K-WIRE 229MX1.6 (WIRE) IMPLANT
KIT CLEAN CATCH URINE (SET/KITS/TRAYS/PACK) ×1 IMPLANT
KIT ROOM TURNOVER APOR (KITS) ×2 IMPLANT
MANIFOLD NEPTUNE II (INSTRUMENTS) ×2 IMPLANT
NDL HYPO 21X1.5 SAFETY (NEEDLE) ×1 IMPLANT
NEEDLE HYPO 21X1.5 SAFETY (NEEDLE) ×2 IMPLANT
NS IRRIG 1000ML POUR BTL (IV SOLUTION) ×3 IMPLANT
PACK BASIC LIMB (CUSTOM PROCEDURE TRAY) ×2 IMPLANT
PAD ABD 5X9 TENDERSORB (GAUZE/BANDAGES/DRESSINGS) ×2 IMPLANT
PAD ARMBOARD 7.5X6 YLW CONV (MISCELLANEOUS) ×2 IMPLANT
PAD CAST 4YDX4 CTTN HI CHSV (CAST SUPPLIES) ×1 IMPLANT
PADDING CAST COTTON 4X4 STRL (CAST SUPPLIES) ×2
PADDING CAST COTTON 6X4 STRL (CAST SUPPLIES) IMPLANT
PASSER SUT SWANSON 36MM LOOP (INSTRUMENTS) ×1 IMPLANT
PIN CAPS ORTHO GREEN .062 (PIN) IMPLANT
SET BASIN LINEN APH (SET/KITS/TRAYS/PACK) ×2 IMPLANT
SLING ARM FOAM STRAP MED (SOFTGOODS) ×1 IMPLANT
SPLINT IMMOBILIZER J 3INX20FT (CAST SUPPLIES)
SPLINT J IMMOBILIZER 3X20FT (CAST SUPPLIES) ×1 IMPLANT
SPONGE GAUZE 4X4 12PLY (GAUZE/BANDAGES/DRESSINGS) ×2 IMPLANT
SPONGE LAP 18X18 X RAY DECT (DISPOSABLE) ×2 IMPLANT
STAPLER VISISTAT 35W (STAPLE) ×2 IMPLANT
SUT ETHIBOND 5 LR DA (SUTURE) ×1 IMPLANT
SUT ETHILON 3 0 FSL (SUTURE) ×1 IMPLANT
SUT MNCRL 0 VIOLET CTX 36 (SUTURE) ×2 IMPLANT
SUT MON AB 2-0 SH 27 (SUTURE) ×6
SUT MON AB 2-0 SH27 (SUTURE) IMPLANT
SUT MONOCRYL 0 CTX 36 (SUTURE) ×1
SWAB CULTURE LIQ STUART DBL (MISCELLANEOUS) ×1 IMPLANT
SYR 30ML LL (SYRINGE) ×2 IMPLANT
SYR BULB IRRIGATION 50ML (SYRINGE) ×3 IMPLANT
TOWEL OR 17X26 4PK STRL BLUE (TOWEL DISPOSABLE) ×3 IMPLANT
TUBE ANAEROBIC PORT A CUL  W/M (MISCELLANEOUS) ×1 IMPLANT
VESSEL LOOPS MAXI RED (MISCELLANEOUS) IMPLANT

## 2012-08-24 NOTE — Op Note (Signed)
08/24/2012  12:19 PM  PATIENT:  Ryan [name]  67 y.o. male  PRE-OPERATIVE DIAGNOSIS:  Left olecranon nonunion, left elbow septic arthritis  POST-OPERATIVE DIAGNOSIS:  Left olecranon nonunion, left elbow septic arthritis  FINDINGS: A sinus tract communicating with the elbow joint. Large amount of synovial fluid which was cloudy and yellow. Nonunion olecranon.  Details of procedure Site confirmation was confirmed as left elbow marked by palpation and certain. Chart reviewed and updated. Patient to the operating room. He was given 2 g of IV Ancef prior to incision and had general anesthesia. In the supine position with an armboard And an angled support the 2 previous incisions were marked out. The sinus tract was marked out. An incision was made in the previous incision in line with the sinus tract which was elliptically excised. Incision was carried proximally. Full thickness skin flap was created with sharp dissection. The nonunion was taken down and an immediate gush of synovial fluid was noticed. The olecranon was proximally retracted and the joint was inspected. There was severe degeneration of the joint has had this was debrided. The joint was irrigated with copious amounts of saline. And a #5 Tycron suture was passed beneath the triceps at the olecranon triceps margin a drill hole was placed transversely across the ulna. The suture was passed in a figure-of-eight fashion and tied down securing the olecranon in a temporary fashion until the infection is cleared.  The wound was irrigated again the periosteum was closed with 2-0 Monocryl subcutaneous was closed with 2-0 Monocryl skin was closed with staples over a suction drain  A sterile bandage was applied and the tourniquet was released.    PROCEDURE:  Procedure(s): OPEN REDUCTION INTERNAL FIXATION (ORIF) ELBOW/OLECRANON FRACTURE (Left) INCISION AND DRAINAGE left elbow (Left)  SURGEON:  Surgeon(s) and Role:    * Vickki Hearing, MD -  Primary  PHYSICIAN ASSISTANT:   ASSISTANTS: Ryan [name]   ANESTHESIA:   general  EBL:  Total I/O In: 1500 [I.V.:1500] Out: 5 [Blood:5]  BLOOD ADMINISTERED:none  DRAINS: One large Hemovac drain in the subcutaneous tissue   LOCAL MEDICATIONS USED:  MARCAINE  half percent with epinephrine was 30 cc  SPECIMEN:  Source of Specimen:  Sinus track was sent for culture along with anaerobic and aerobic cultures a swab  DISPOSITION OF SPECIMEN:  Microbiology  COUNTS:  YES  TOURNIQUET:   Total Tourniquet Time Documented: Upper Arm (Left) - 61 minutes Total: Upper Arm (Left) - 61 minutes   DICTATION: .Reubin Milan Dictation  PLAN OF CARE: Discharge to home after PACU  PATIENT DISPOSITION:  PACU - hemodynamically stable.   Delay start of Pharmacological VTE agent (>24hrs) due to surgical blood loss or risk of bleeding: not applicable

## 2012-08-24 NOTE — Interval H&P Note (Signed)
History and Physical Interval Note:  08/24/2012 10:23 AM  Ryan [name]  has presented today for surgery, with the diagnosis of Left olecranon nonunion, left elbow septic arthritis  The various methods of treatment have been discussed with the patient and family. After consideration of risks, benefits and other options for treatment, the patient has consented to  Procedure(s) with comments: OPEN REDUCTION INTERNAL FIXATION (ORIF) ELBOW/OLECRANON FRACTURE (Left) - Possibility of its position of olecranon and triceps advancement INCISION AND DRAINAGE left elbow (Left) - Cultures anaerobic and aerobic as a surgical intervention .  The patient's history has been reviewed, patient examined, no change in status, stable for surgery.  I have reviewed the patient's chart and labs.  Questions were answered to the patient's satisfaction.     Fuller Canada

## 2012-08-24 NOTE — Preoperative (Signed)
Beta Blockers   Reason not to administer Beta Blockers:Not Applicable 

## 2012-08-24 NOTE — Anesthesia Preprocedure Evaluation (Signed)
Anesthesia Evaluation  Patient identified by MRN, date of birth, ID band Patient awake    Reviewed: Allergy & Precautions, H&P , NPO status , Patient's Chart, lab work & pertinent test results  Airway Mallampati: II TM Distance: >3 FB Neck ROM: Full    Dental  (+) Edentulous Upper   Pulmonary Current Smoker,    Pulmonary exam normal       Cardiovascular negative cardio ROS  Rhythm:Regular Rate:Normal     Neuro/Psych negative neurological ROS  negative psych ROS   GI/Hepatic negative GI ROS, Neg liver ROS,   Endo/Other  negative endocrine ROS  Renal/GU negative Renal ROS  negative genitourinary   Musculoskeletal   Abdominal Normal abdominal exam  (+)   Peds  Hematology negative hematology ROS (+)   Anesthesia Other Findings   Reproductive/Obstetrics                           Anesthesia Physical Anesthesia Plan  ASA: II  Anesthesia Plan: General   Post-op Pain Management:    Induction: Intravenous  Airway Management Planned: LMA  Additional Equipment:   Intra-op Plan:   Post-operative Plan: Extubation in OR  Informed Consent: I have reviewed the patients History and Physical, chart, labs and discussed the procedure including the risks, benefits and alternatives for the proposed anesthesia with the patient or authorized representative who has indicated his/her understanding and acceptance.     Plan Discussed with:   Anesthesia Plan Comments:         Anesthesia Quick Evaluation

## 2012-08-24 NOTE — Anesthesia Postprocedure Evaluation (Signed)
  Anesthesia Post-op Note  Patient: Ryan [name]  Procedure(s) Performed: Procedure(s): OPEN REDUCTION INTERNAL FIXATION (ORIF) ELBOW/OLECRANON FRACTURE (Left) INCISION AND DRAINAGE left elbow (Left)  Patient Location: PACU  Anesthesia Type:General  Level of Consciousness: awake, alert , oriented and patient cooperative  Airway and Oxygen Therapy: Patient Spontanous Breathing and Patient connected to nasal cannula oxygen  Post-op Pain: mild  Post-op Assessment: Post-op Vital signs reviewed, Patient's Cardiovascular Status Stable, Respiratory Function Stable, Patent Airway, No signs of Nausea or vomiting and Pain level controlled  Post-op Vital Signs: Reviewed and stable  Complications: No apparent anesthesia complications

## 2012-08-24 NOTE — Transfer of Care (Signed)
Immediate Anesthesia Transfer of Care Note  Patient: Ryan [name]  Procedure(s) Performed: Procedure(s): OPEN REDUCTION INTERNAL FIXATION (ORIF) ELBOW/OLECRANON FRACTURE (Left) INCISION AND DRAINAGE left elbow (Left)  Patient Location: PACU  Anesthesia Type:General  Level of Consciousness: awake, alert , oriented and patient cooperative  Airway & Oxygen Therapy: Patient Spontanous Breathing and Patient connected to nasal cannula oxygen  Post-op Assessment: Report given to PACU RN and Post -op Vital signs reviewed and stable  Post vital signs: Reviewed and stable  Complications: No apparent anesthesia complications

## 2012-08-24 NOTE — Anesthesia Procedure Notes (Signed)
Procedure Name: LMA Insertion Date/Time: 08/24/2012 10:41 AM Performed by: Carolyne Littles, AMY L Pre-anesthesia Checklist: Patient identified, Timeout performed, Emergency Drugs available, Suction available and Patient being monitored Patient Re-evaluated:Patient Re-evaluated prior to inductionOxygen Delivery Method: Circle system utilized Intubation Type: IV induction Ventilation: Mask ventilation without difficulty LMA: LMA inserted LMA Size: 4.0 Number of attempts: 1 Placement Confirmation: positive ETCO2 and breath sounds checked- equal and bilateral Tube secured with: Tape Dental Injury: Teeth and Oropharynx as per pre-operative assessment

## 2012-08-24 NOTE — Brief Op Note (Signed)
08/24/2012  12:19 PM  PATIENT:  Ryan [name]  67 y.o. male  PRE-OPERATIVE DIAGNOSIS:  Left olecranon nonunion, left elbow septic arthritis  POST-OPERATIVE DIAGNOSIS:  Left olecranon nonunion, left elbow septic arthritis  FINDINGS: A sinus tract communicating with the elbow joint. Large amount of synovial fluid which was cloudy and yellow. Nonunion olecranon.  Details of procedure Site confirmation was confirmed as left elbow marked by palpation and certain. Chart reviewed and updated. Patient to the operating room. He was given 2 g of IV Ancef prior to incision and had general anesthesia. In the supine position with an armboard And an angled support the 2 previous incisions were marked out. The sinus tract was marked out. An incision was made in the previous incision in line with the sinus tract which was elliptically excised. Incision was carried proximally. Full thickness skin flap was created with sharp dissection. The nonunion was taken down and an immediate gush of synovial fluid was noticed. The olecranon was proximally retracted and the joint was inspected. There was severe degeneration of the joint has had this was debrided. The joint was irrigated with copious amounts of saline. And a #5 Tycron suture was passed beneath the triceps at the olecranon triceps margin a drill hole was placed transversely across the ulna. The suture was passed in a figure-of-eight fashion and tied down securing the olecranon in a temporary fashion until the infection is cleared.  The wound was irrigated again the periosteum was closed with 2-0 Monocryl subcutaneous was closed with 2-0 Monocryl skin was closed with staples over a suction drain  A sterile bandage was applied and the tourniquet was released.    PROCEDURE:  Procedure(s): OPEN REDUCTION INTERNAL FIXATION (ORIF) ELBOW/OLECRANON FRACTURE (Left) INCISION AND DRAINAGE left elbow (Left)  SURGEON:  Surgeon(s) and Role:    * Stanley E Harrison, MD -  Primary  PHYSICIAN ASSISTANT:   ASSISTANTS: Debbie Dallas   ANESTHESIA:   general  EBL:  Total I/O In: 1500 [I.V.:1500] Out: 5 [Blood:5]  BLOOD ADMINISTERED:none  DRAINS: One large Hemovac drain in the subcutaneous tissue   LOCAL MEDICATIONS USED:  MARCAINE  half percent with epinephrine was 30 cc  SPECIMEN:  Source of Specimen:  Sinus track was sent for culture along with anaerobic and aerobic cultures a swab  DISPOSITION OF SPECIMEN:  Microbiology  COUNTS:  YES  TOURNIQUET:   Total Tourniquet Time Documented: Upper Arm (Left) - 61 minutes Total: Upper Arm (Left) - 61 minutes   DICTATION: .Dragon Dictation  PLAN OF CARE: Discharge to home after PACU  PATIENT DISPOSITION:  PACU - hemodynamically stable.   Delay start of Pharmacological VTE agent (>24hrs) due to surgical blood loss or risk of bleeding: not applicable  

## 2012-08-25 ENCOUNTER — Other Ambulatory Visit: Payer: Self-pay | Admitting: *Deleted

## 2012-08-25 ENCOUNTER — Ambulatory Visit (HOSPITAL_COMMUNITY)
Admission: RE | Admit: 2012-08-25 | Discharge: 2012-08-25 | Disposition: A | Discharge status: Home / Self Care | Payer: Medicare Other | Source: Ambulatory Visit | Attending: Orthopedic Surgery | Admitting: Orthopedic Surgery

## 2012-08-25 ENCOUNTER — Telehealth: Payer: Self-pay | Admitting: Orthopedic Surgery

## 2012-08-25 ENCOUNTER — Encounter (HOSPITAL_COMMUNITY): Payer: Self-pay | Admitting: Orthopedic Surgery

## 2012-08-25 DIAGNOSIS — M009 Pyogenic arthritis, unspecified: Secondary | ICD-10-CM | POA: Insufficient documentation

## 2012-08-25 MED ORDER — SODIUM CHLORIDE 0.9 % IJ SOLN: 10.0000 mL | INTRAMUSCULAR | Status: DC | PRN

## 2012-08-25 MED ORDER — SODIUM CHLORIDE 0.9 % IJ SOLN: 10.0000 mL | Freq: Two times a day (BID) | INTRAMUSCULAR | Status: DC

## 2012-08-25 NOTE — Telephone Encounter (Signed)
Faxed referral for Home Health to [facility].

## 2012-08-25 NOTE — Telephone Encounter (Signed)
Order home health  Vancomycin 1 gm iv q 24 hrs x 6 weeks  follow lab protocol

## 2012-08-25 NOTE — Telephone Encounter (Signed)
Ryan [name] asked if the medicine for his  [facility]  Line will be handled by the home health agency or did you call in a prescription for it . He is concerned because he has not heard from anyone from the home health agency yet. Was the orders for the home health  Handled at the hospital or from our office? His # [phone]

## 2012-08-25 NOTE — Progress Notes (Signed)
Chest xray verifies placement of picc at [facility]  given copy of xray with ok to use picc line. Verbalized understanding of discharge instruction and give a written picc line home care info.

## 2012-08-25 NOTE — Progress Notes (Signed)
Patient here to get piccline placement for 6 weeks antibiotics for infection left elbow.

## 2012-08-27 ENCOUNTER — Ambulatory Visit (INDEPENDENT_AMBULATORY_CARE_PROVIDER_SITE_OTHER): Payer: Medicare Other | Admitting: Orthopedic Surgery

## 2012-08-27 ENCOUNTER — Encounter: Payer: Self-pay | Admitting: Orthopedic Surgery

## 2012-08-27 VITALS — BP 130/84 | Ht 68.0 in | Wt 182.0 lb

## 2012-08-27 DIAGNOSIS — M009 Pyogenic arthritis, unspecified: Secondary | ICD-10-CM

## 2012-08-27 DIAGNOSIS — IMO0002 Reserved for concepts with insufficient information to code with codable children: Secondary | ICD-10-CM

## 2012-08-27 DIAGNOSIS — S52022K Displaced fracture of olecranon process without intraarticular extension of left ulna, subsequent encounter for closed fracture with nonunion: Secondary | ICD-10-CM

## 2012-08-27 LAB — WOUND CULTURE: Culture: NO GROWTH

## 2012-08-27 NOTE — Progress Notes (Signed)
Patient ID: Ryan [name], male   DOB: 21-Jun-1945, 67 y.o.   MRN: [id number] Postop day 3 status post incision and drainage left elbow joint and reattachment of the olecranon internal fixation heavy suture  Cultures to date negative  Drain removed  Dressing applied. Return in one week assess if staples can be removed.  Continue IV antibiotics vancomycin  Long-term plan is for reattachment of the olecranon  Encounter Diagnoses  Name Primary?  . Fibrous non-union Yes  . Fracture of ulna, olecranon, left, with nonunion, subsequent encounter   . Septic joint of left elbow

## 2012-08-27 NOTE — Patient Instructions (Signed)
Wear sling do exercises 3 x a day   Continue Vancomycin IV

## 2012-08-28 LAB — TISSUE CULTURE: Culture: NO GROWTH

## 2012-08-29 LAB — ANAEROBIC CULTURE

## 2012-09-03 ENCOUNTER — Ambulatory Visit (INDEPENDENT_AMBULATORY_CARE_PROVIDER_SITE_OTHER): Payer: Medicare Other | Admitting: Orthopedic Surgery

## 2012-09-03 VITALS — BP 116/72 | Ht 68.0 in | Wt 182.0 lb

## 2012-09-03 DIAGNOSIS — L089 Local infection of the skin and subcutaneous tissue, unspecified: Secondary | ICD-10-CM

## 2012-09-03 DIAGNOSIS — M249 Joint derangement, unspecified: Secondary | ICD-10-CM

## 2012-09-03 DIAGNOSIS — M009 Pyogenic arthritis, unspecified: Secondary | ICD-10-CM

## 2012-09-03 DIAGNOSIS — L988 Other specified disorders of the skin and subcutaneous tissue: Secondary | ICD-10-CM

## 2012-09-03 DIAGNOSIS — M24829 Other specific joint derangements of unspecified elbow, not elsewhere classified: Secondary | ICD-10-CM

## 2012-09-03 NOTE — Progress Notes (Signed)
Patient ID: Ryan [name], male   DOB: May 21, 1945, 67 y.o.   MRN: [id number] Postop irrigation debridement and reattachment of olecranon with heavy suture internal fixation. Patient on IV vancomycin now since June 4  His wound looks good his cultures are negative his white count is 10. His vancomycin levels are good he has no left shift  Staples were removed today his arm feels pretty good his range of motion is still about 30-110  Recommend continue antibiotics for the balance of 3 weeks then wait 3-4 weeks and try to perform internal fixation of the olecranon  Return June 25 to check the wound and assess the need for ability to stop antibiotics

## 2012-09-03 NOTE — Patient Instructions (Addendum)
CONTINUE IV MEDICATION  RETURN ON 6-25

## 2012-09-07 ENCOUNTER — Telehealth: Payer: Self-pay | Admitting: Orthopedic Surgery

## 2012-09-07 NOTE — Telephone Encounter (Signed)
Call received from Sarah, [facility] nurse, 4:03pm, states checking incision now at patient's home, said wound is pink, and warm to touch.  (patient's next scheduled appointment is 09/16/12. Please advise and call back to nurse at direct ph# [phone]

## 2012-09-07 NOTE — Telephone Encounter (Signed)
Come in for appointment tomorrow 09/08/12

## 2012-09-08 ENCOUNTER — Ambulatory Visit (INDEPENDENT_AMBULATORY_CARE_PROVIDER_SITE_OTHER): Payer: Medicare Other | Admitting: Orthopedic Surgery

## 2012-09-08 ENCOUNTER — Encounter: Payer: Self-pay | Admitting: Orthopedic Surgery

## 2012-09-08 VITALS — Ht 68.0 in | Wt 182.0 lb

## 2012-09-08 DIAGNOSIS — T8131XD Disruption of external operation (surgical) wound, not elsewhere classified, subsequent encounter: Secondary | ICD-10-CM

## 2012-09-08 DIAGNOSIS — Z5189 Encounter for other specified aftercare: Secondary | ICD-10-CM

## 2012-09-08 DIAGNOSIS — IMO0002 Reserved for concepts with insufficient information to code with codable children: Secondary | ICD-10-CM

## 2012-09-08 NOTE — Patient Instructions (Addendum)
Continue antibiotics followup as scheduled

## 2012-09-08 NOTE — Progress Notes (Signed)
Patient ID: Ryan [name], male   DOB: 1946-01-12, 67 y.o.   MRN: [id number] Chief Complaint  Patient presents with  . Wound Check    Post op Incision check DOS 08/24/12   Ht 5\' 8"  (1.727 m)  Wt 182 lb (82.555 kg)  BMI 27.68 kg/m2  The home care nurse thought the patient's wound was red and warm so he was brought in today earlier than his scheduled appointment  His wound looks clean dry and intact its a little warm but nothing to be concerned with. Is a little pink but no redness or erythema.  The patient's arm feels fine is on vancomycin he is on appropriate levels and we'll see him in his regular scheduled appointment.

## 2012-09-16 ENCOUNTER — Ambulatory Visit (INDEPENDENT_AMBULATORY_CARE_PROVIDER_SITE_OTHER): Payer: Medicare Other | Admitting: Orthopedic Surgery

## 2012-09-16 ENCOUNTER — Encounter: Payer: Self-pay | Admitting: Orthopedic Surgery

## 2012-09-16 VITALS — BP 154/92 | Ht 68.0 in | Wt 182.0 lb

## 2012-09-16 DIAGNOSIS — M24829 Other specific joint derangements of unspecified elbow, not elsewhere classified: Secondary | ICD-10-CM

## 2012-09-16 DIAGNOSIS — M009 Pyogenic arthritis, unspecified: Secondary | ICD-10-CM

## 2012-09-16 DIAGNOSIS — IMO0002 Reserved for concepts with insufficient information to code with codable children: Secondary | ICD-10-CM

## 2012-09-16 DIAGNOSIS — L988 Other specified disorders of the skin and subcutaneous tissue: Secondary | ICD-10-CM

## 2012-09-16 DIAGNOSIS — M249 Joint derangement, unspecified: Secondary | ICD-10-CM

## 2012-09-16 DIAGNOSIS — L089 Local infection of the skin and subcutaneous tissue, unspecified: Secondary | ICD-10-CM

## 2012-09-16 NOTE — Patient Instructions (Signed)
Take antibiotics until the 30th then stop   CBC, Esr and c reactive protein on the 30th with [facility]   F/u for preop and xrays left elbow on July 8th

## 2012-09-16 NOTE — Progress Notes (Signed)
Patient ID: Ryan [name], male   DOB: 30-Sep-1945, 67 y.o.   MRN: [id number]   Chief Complaint  Patient presents with  . Follow-up    2 week recheck left elbow    The patient is status post irrigation debridement and culture and suture fixation of an olecranon fracture on June 2 so he is now 23 days from his procedure his cultures have been negative he is on vancomycin his levels are good his creatinine is good  His elbow feels good. He has actually improved his range of motion and is now 20-120  Wound looks clean. Elbow warm to touch no erythema  Continue antibiotics for the full 4 weeks and then he can stop it we'll check a sedimentation rate and C-reactive protein and white count at that point  Then on the come back and we can take an x-ray and see exactly what we need to do with his elbow at this point now that he has improved significantly without any metal.

## 2012-09-21 ENCOUNTER — Telehealth: Payer: Self-pay | Admitting: Orthopedic Surgery

## 2012-09-21 NOTE — Telephone Encounter (Signed)
Patient called to ask if he would be having the IV antibiotics stopped today, or soon?  States Home health is scheduled to come out today (08/3012).  Recent office notes indicate he was to have labs, and Xray of elbow, at next scheduled appointment 09/29/12.  Please advise. Patient ph# [phone]

## 2012-09-21 NOTE — Telephone Encounter (Signed)
Sent order from last office visit to stop vancomycin 09/21/12

## 2012-09-29 ENCOUNTER — Ambulatory Visit (INDEPENDENT_AMBULATORY_CARE_PROVIDER_SITE_OTHER): Payer: Medicare Other

## 2012-09-29 ENCOUNTER — Ambulatory Visit (INDEPENDENT_AMBULATORY_CARE_PROVIDER_SITE_OTHER): Payer: Medicare Other | Admitting: Orthopedic Surgery

## 2012-09-29 ENCOUNTER — Encounter: Payer: Self-pay | Admitting: Orthopedic Surgery

## 2012-09-29 VITALS — BP 136/96 | Ht 68.0 in | Wt 182.0 lb

## 2012-09-29 DIAGNOSIS — S52022K Displaced fracture of olecranon process without intraarticular extension of left ulna, subsequent encounter for closed fracture with nonunion: Secondary | ICD-10-CM

## 2012-09-29 DIAGNOSIS — S52023A Displaced fracture of olecranon process without intraarticular extension of unspecified ulna, initial encounter for closed fracture: Secondary | ICD-10-CM | POA: Insufficient documentation

## 2012-09-29 DIAGNOSIS — S52022S Displaced fracture of olecranon process without intraarticular extension of left ulna, sequela: Secondary | ICD-10-CM

## 2012-09-29 DIAGNOSIS — S42309S Unspecified fracture of shaft of humerus, unspecified arm, sequela: Secondary | ICD-10-CM

## 2012-09-29 DIAGNOSIS — IMO0002 Reserved for concepts with insufficient information to code with codable children: Secondary | ICD-10-CM

## 2012-09-29 NOTE — Patient Instructions (Addendum)
Surgery open treatment internal fixation left elbow  Non union olecranon

## 2012-09-29 NOTE — Progress Notes (Signed)
Patient ID: Ryan [name], male   DOB: 1945/11/29, 67 y.o.   MRN: [id number] Chief Complaint  Patient presents with  . Follow-up    Post op left elbow and review of labs/June 2nd DOS    BP 136/96  Ht 5\' 8"  (1.727 m)  Wt 182 lb (82.555 kg)  BMI 27.68 kg/m2  Postop status post irrigation debridement culture left elbow sterile culture with is noted on culture followup  Today's laboratory start studies after being off of vancomycin 4 week show that his sed rate is down from 20-16 his C-reactive protein is down from 11-1 his white count of 10.1 which is also down from a high of 10.8  His incision looks good and x-ray today shows that his olecranon is a nonunion with retraction  I discussed this with him he would like to make an attempt to close this area off with surgical fixation versus waiting to try and see if the effusion occur again. It is likely to occur again since there is a communication under the skin with the joint  We will do an open treatment internal fixation of the left elbow not sure right now which fixation devices we will use we will probably start off with a large screw and try to wire or heavy suture around it as a tension band. We can also attempt to use a plate but that is more hardware. Am also concerned about K wire fixation and wire fixation because that's also hardware. Screw fixation may not be possible secondary to the osteopenia of the olecranon fragment. This will be determined at the time of surgery.

## 2012-10-01 ENCOUNTER — Other Ambulatory Visit: Payer: Self-pay | Admitting: *Deleted

## 2012-10-06 ENCOUNTER — Encounter (HOSPITAL_COMMUNITY): Payer: Self-pay | Admitting: Pharmacy Technician

## 2012-10-13 ENCOUNTER — Encounter (HOSPITAL_COMMUNITY)
Admission: RE | Admit: 2012-10-13 | Discharge: 2012-10-13 | Disposition: A | Discharge status: Home / Self Care | Payer: Medicare Other | Source: Ambulatory Visit | Attending: Orthopedic Surgery | Admitting: Orthopedic Surgery

## 2012-10-13 ENCOUNTER — Telehealth: Payer: Self-pay | Admitting: Orthopedic Surgery

## 2012-10-13 ENCOUNTER — Encounter (HOSPITAL_COMMUNITY): Payer: Self-pay

## 2012-10-13 HISTORY — DX: Unspecified osteoarthritis, unspecified site: M19.90

## 2012-10-13 LAB — BASIC METABOLIC PANEL
BUN: 14 mg/dL (ref 6–23)
CO2: 30 mEq/L (ref 19–32)
Chloride: 99 mEq/L (ref 96–112)
Creatinine, Ser: 1.23 mg/dL (ref 0.50–1.35)
GFR calc Af Amer: 69 mL/min — ABNORMAL LOW (ref >90)
Glucose, Bld: 139 mg/dL — ABNORMAL HIGH (ref 70–99)
Potassium: 4.4 mEq/L (ref 3.5–5.1)

## 2012-10-13 NOTE — Telephone Encounter (Signed)
Received call from [facility] nurse, Maralyn Sago - direct phone # [phone] - states that she is visiting patient today, and he relayed that he tripped over a shrub and fell on Saturday 10/10/12, but did not injure his elbow; states "just a few scrapes."  Patient's surgery is scheduled for surgery 10/19/12.  Please advise. (Patient [phone])

## 2012-10-13 NOTE — Patient Instructions (Addendum)
MIKKO LEWELLEN  10/13/2012   Your procedure is scheduled on:  Monday, 10/19/12  Report to [facility].  Call this number if you have problems the morning of surgery: [phone]   Remember:   Do not eat food or drink liquids after midnight.   Take these medicines the morning of surgery with A SIP OF WATER: microzide   Do not wear jewelry, make-up or nail polish.  Do not wear lotions, powders, or perfumes. You may wear deodorant.  Do not shave 48 hours prior to surgery. Men may shave face and neck.  Do not bring valuables to the hospital.  [facility] is not responsible                   for any belongings or valuables.  Contacts, dentures or bridgework may not be worn into surgery.  Leave suitcase in the car. After surgery it may be brought to your room.  For patients admitted to the hospital, checkout time is 11:00 AM the day of  discharge.   Patients discharged the day of surgery will not be allowed to drive  home.  Name and phone number of your driver: family  Special Instructions: Shower using CHG 2 nights before surgery and the night before surgery.  If you shower the day of surgery use CHG.  Use special wash - you have one bottle of CHG for all showers.  You should use approximately 1/3 of the bottle for each shower.   Please read over the following fact sheets that you were given: Pain Booklet, Surgical Site Infection Prevention and Care and Recovery After Surgery

## 2012-10-13 NOTE — Telephone Encounter (Signed)
Routing to Dr Harrison 

## 2012-10-19 ENCOUNTER — Ambulatory Visit (HOSPITAL_COMMUNITY): Payer: Medicare Other

## 2012-10-19 ENCOUNTER — Encounter (HOSPITAL_COMMUNITY)
Admission: RE | Disposition: A | Discharge status: Home / Self Care | Payer: Self-pay | Source: Ambulatory Visit | Attending: Orthopedic Surgery

## 2012-10-19 ENCOUNTER — Ambulatory Visit (HOSPITAL_COMMUNITY): Payer: Medicare Other | Admitting: Anesthesiology

## 2012-10-19 ENCOUNTER — Ambulatory Visit (HOSPITAL_COMMUNITY)
Admission: RE | Admit: 2012-10-19 | Discharge: 2012-10-19 | Disposition: A | Discharge status: Home / Self Care | Payer: Medicare Other | Source: Ambulatory Visit | Attending: Orthopedic Surgery | Admitting: Orthopedic Surgery

## 2012-10-19 ENCOUNTER — Encounter (HOSPITAL_COMMUNITY): Payer: Self-pay | Admitting: Anesthesiology

## 2012-10-19 ENCOUNTER — Encounter (HOSPITAL_COMMUNITY): Payer: Self-pay | Admitting: *Deleted

## 2012-10-19 DIAGNOSIS — S52022K Displaced fracture of olecranon process without intraarticular extension of left ulna, subsequent encounter for closed fracture with nonunion: Secondary | ICD-10-CM

## 2012-10-19 DIAGNOSIS — IMO0002 Reserved for concepts with insufficient information to code with codable children: Secondary | ICD-10-CM

## 2012-10-19 DIAGNOSIS — S42309S Unspecified fracture of shaft of humerus, unspecified arm, sequela: Secondary | ICD-10-CM | POA: Insufficient documentation

## 2012-10-19 DIAGNOSIS — X58XXXS Exposure to other specified factors, sequela: Secondary | ICD-10-CM | POA: Insufficient documentation

## 2012-10-19 HISTORY — PX: I & D EXTREMITY: SHX5045

## 2012-10-19 HISTORY — PX: ORIF ELBOW FRACTURE: SHX5031

## 2012-10-19 SURGERY — OPEN REDUCTION INTERNAL FIXATION (ORIF) ELBOW/OLECRANON FRACTURE
Anesthesia: General | Site: Elbow | Laterality: Left | Wound class: Clean

## 2012-10-19 MED ORDER — TRAMADOL HCL 50 MG PO TABS
50.0000 mg | ORAL_TABLET | Freq: Once | ORAL | Status: AC
  Administered 2012-10-19: 50 mg via ORAL

## 2012-10-19 MED ORDER — CHLORHEXIDINE GLUCONATE 4 % EX LIQD: 60.0000 mL | Freq: Once | CUTANEOUS | Status: DC

## 2012-10-19 MED ORDER — PHENYLEPHRINE HCL 10 MG/ML IJ SOLN
INTRAMUSCULAR | Status: AC
  Filled 2012-10-19: qty 1

## 2012-10-19 MED ORDER — ACETAMINOPHEN 10 MG/ML IV SOLN
INTRAVENOUS | Status: AC
  Filled 2012-10-19: qty 100

## 2012-10-19 MED ORDER — GLYCOPYRROLATE 0.2 MG/ML IJ SOLN
INTRAMUSCULAR | Status: AC
  Filled 2012-10-19: qty 1

## 2012-10-19 MED ORDER — SODIUM CHLORIDE 0.9 % IR SOLN
Status: DC | PRN
  Administered 2012-10-19: 1000 mL

## 2012-10-19 MED ORDER — EPHEDRINE SULFATE 50 MG/ML IJ SOLN
INTRAMUSCULAR | Status: AC
  Filled 2012-10-19: qty 1

## 2012-10-19 MED ORDER — SUCCINYLCHOLINE CHLORIDE 20 MG/ML IJ SOLN
INTRAMUSCULAR | Status: AC
  Filled 2012-10-19: qty 1

## 2012-10-19 MED ORDER — CEFAZOLIN SODIUM-DEXTROSE 2-3 GM-% IV SOLR
INTRAVENOUS | Status: AC
  Filled 2012-10-19: qty 50

## 2012-10-19 MED ORDER — ONDANSETRON HCL 4 MG/2ML IJ SOLN
4.0000 mg | Freq: Once | INTRAMUSCULAR | Status: AC
  Administered 2012-10-19: 4 mg via INTRAVENOUS

## 2012-10-19 MED ORDER — BUPIVACAINE-EPINEPHRINE PF 0.5-1:200000 % IJ SOLN
INTRAMUSCULAR | Status: DC | PRN
  Administered 2012-10-19: 60 mg

## 2012-10-19 MED ORDER — PHENYLEPHRINE HCL 10 MG/ML IJ SOLN
INTRAMUSCULAR | Status: DC | PRN
  Administered 2012-10-19 (×2): 50 ug via INTRAVENOUS

## 2012-10-19 MED ORDER — HYDROMORPHONE HCL 4 MG PO TABS: 4.0000 mg | ORAL_TABLET | ORAL | Status: DC | PRN

## 2012-10-19 MED ORDER — ACETAMINOPHEN 10 MG/ML IV SOLN
1000.0000 mg | Freq: Once | INTRAVENOUS | Status: AC
  Administered 2012-10-19: 1000 mg via INTRAVENOUS

## 2012-10-19 MED ORDER — MIDAZOLAM HCL 2 MG/2ML IJ SOLN
1.0000 mg | INTRAMUSCULAR | Status: DC | PRN
Start: 2012-10-19 — End: 2012-10-19
  Administered 2012-10-19: 2 mg via INTRAVENOUS

## 2012-10-19 MED ORDER — ONDANSETRON HCL 4 MG/2ML IJ SOLN
INTRAMUSCULAR | Status: AC
  Filled 2012-10-19: qty 2

## 2012-10-19 MED ORDER — TRAMADOL HCL 50 MG PO TABS
ORAL_TABLET | ORAL | Status: AC
  Filled 2012-10-19: qty 1

## 2012-10-19 MED ORDER — BUPIVACAINE-EPINEPHRINE PF 0.5-1:200000 % IJ SOLN
INTRAMUSCULAR | Status: AC
  Filled 2012-10-19: qty 20

## 2012-10-19 MED ORDER — CEFAZOLIN SODIUM-DEXTROSE 2-3 GM-% IV SOLR
2.0000 g | INTRAVENOUS | Status: AC
  Administered 2012-10-19: 2 g via INTRAVENOUS

## 2012-10-19 MED ORDER — LIDOCAINE HCL (CARDIAC) 20 MG/ML IV SOLN
INTRAVENOUS | Status: DC | PRN
  Administered 2012-10-19: 30 mg via INTRAVENOUS

## 2012-10-19 MED ORDER — FENTANYL CITRATE 0.05 MG/ML IJ SOLN
INTRAMUSCULAR | Status: AC
  Filled 2012-10-19: qty 2

## 2012-10-19 MED ORDER — FENTANYL CITRATE 0.05 MG/ML IJ SOLN: 25.0000 ug | INTRAMUSCULAR | Status: DC | PRN

## 2012-10-19 MED ORDER — SUCCINYLCHOLINE CHLORIDE 20 MG/ML IJ SOLN
INTRAMUSCULAR | Status: DC | PRN
  Administered 2012-10-19: 100 mg via INTRAVENOUS

## 2012-10-19 MED ORDER — ROCURONIUM BROMIDE 50 MG/5ML IV SOLN
INTRAVENOUS | Status: AC
  Filled 2012-10-19: qty 1

## 2012-10-19 MED ORDER — GLYCOPYRROLATE 0.2 MG/ML IJ SOLN
INTRAMUSCULAR | Status: DC | PRN
  Administered 2012-10-19 (×2): 0.2 mg via INTRAVENOUS

## 2012-10-19 MED ORDER — HYDROMORPHONE HCL 4 MG PO TABS: 4.0000 mg | ORAL_TABLET | Freq: Two times a day (BID) | ORAL | Status: DC | PRN

## 2012-10-19 MED ORDER — PROPOFOL 10 MG/ML IV BOLUS
INTRAVENOUS | Status: DC | PRN
  Administered 2012-10-19: 160 mg via INTRAVENOUS

## 2012-10-19 MED ORDER — LIDOCAINE HCL (PF) 1 % IJ SOLN
INTRAMUSCULAR | Status: AC
  Filled 2012-10-19: qty 5

## 2012-10-19 MED ORDER — PROPOFOL 10 MG/ML IV EMUL
INTRAVENOUS | Status: AC
  Filled 2012-10-19: qty 20

## 2012-10-19 MED ORDER — EPHEDRINE SULFATE 50 MG/ML IJ SOLN
INTRAMUSCULAR | Status: DC | PRN
  Administered 2012-10-19: 5 mg via INTRAVENOUS

## 2012-10-19 MED ORDER — FENTANYL CITRATE 0.05 MG/ML IJ SOLN
INTRAMUSCULAR | Status: DC | PRN
  Administered 2012-10-19: 25 ug via INTRAVENOUS
  Administered 2012-10-19 (×4): 50 ug via INTRAVENOUS

## 2012-10-19 MED ORDER — LACTATED RINGERS IV SOLN
INTRAVENOUS | Status: DC
  Administered 2012-10-19: 1000 mL via INTRAVENOUS

## 2012-10-19 MED ORDER — NEOSTIGMINE METHYLSULFATE 1 MG/ML IJ SOLN
INTRAMUSCULAR | Status: DC | PRN
  Administered 2012-10-19: 1 mg via INTRAVENOUS

## 2012-10-19 MED ORDER — FENTANYL CITRATE 0.05 MG/ML IJ SOLN
INTRAMUSCULAR | Status: AC
  Filled 2012-10-19: qty 5

## 2012-10-19 MED ORDER — MIDAZOLAM HCL 2 MG/2ML IJ SOLN
INTRAMUSCULAR | Status: AC
  Filled 2012-10-19: qty 2

## 2012-10-19 MED ORDER — ONDANSETRON HCL 4 MG/2ML IJ SOLN: 4.0000 mg | Freq: Once | INTRAMUSCULAR | Status: DC | PRN

## 2012-10-19 MED ORDER — ROCURONIUM BROMIDE 100 MG/10ML IV SOLN
INTRAVENOUS | Status: DC | PRN
  Administered 2012-10-19: 22 mg via INTRAVENOUS
  Administered 2012-10-19: 8 mg via INTRAVENOUS

## 2012-10-19 SURGICAL SUPPLY — 75 items
BAG HAMPER (MISCELLANEOUS) ×2 IMPLANT
BANDAGE ACE 4 STERILE (GAUZE/BANDAGES/DRESSINGS) ×1 IMPLANT
BANDAGE ELASTIC 4 VELCRO NS (GAUZE/BANDAGES/DRESSINGS) ×3 IMPLANT
BANDAGE ESMARK 4X12 BL STRL LF (DISPOSABLE) ×1 IMPLANT
BIT DRILL 2.0 LNG QUCK RELEASE (BIT) IMPLANT
BIT DRILL 2.8 QUICK RELEASE (BIT) IMPLANT
BLADE SURG SZ10 CARB STEEL (BLADE) ×4 IMPLANT
BNDG CMPR 12X4 ELC STRL LF (DISPOSABLE) ×1
BNDG COHESIVE 4X5 TAN NS LF (GAUZE/BANDAGES/DRESSINGS) ×2 IMPLANT
BNDG ESMARK 4X12 BLUE STRL LF (DISPOSABLE) ×2
CAP PIN PROTECTOR ORTHO WHT (CAP) IMPLANT
CHLORAPREP W/TINT 26ML (MISCELLANEOUS) ×2 IMPLANT
CLOTH BEACON ORANGE TIMEOUT ST (SAFETY) ×2 IMPLANT
COVER LIGHT HANDLE STERIS (MISCELLANEOUS) ×4 IMPLANT
COVER MAYO STAND XLG (DRAPE) ×1 IMPLANT
CUFF TOURNIQUET SINGLE 18IN (TOURNIQUET CUFF) ×2 IMPLANT
DECANTER SPIKE VIAL GLASS SM (MISCELLANEOUS) ×3 IMPLANT
DRAPE C-ARM FOLDED MOBILE STRL (DRAPES) ×2 IMPLANT
DRAPE INCISE IOBAN 44X35 STRL (DRAPES) IMPLANT
DRILL 2.0 LNG QUICK RELEASE (BIT) ×2
DRILL 2.8 QUICK RELEASE (BIT) ×2
DRSG MEPILEX BORDER 4X8 (GAUZE/BANDAGES/DRESSINGS) ×1 IMPLANT
ELECT REM PT RETURN 9FT ADLT (ELECTROSURGICAL) ×2
ELECTRODE REM PT RTRN 9FT ADLT (ELECTROSURGICAL) ×1 IMPLANT
GAUZE XEROFORM 5X9 LF (GAUZE/BANDAGES/DRESSINGS) ×1 IMPLANT
GLOVE INDICATOR 7.0 STRL GRN (GLOVE) ×1 IMPLANT
GLOVE INDICATOR 8.0 STRL GRN (GLOVE) ×1 IMPLANT
GLOVE SKINSENSE NS SZ8.0 LF (GLOVE) ×1
GLOVE SKINSENSE STRL SZ8.0 LF (GLOVE) ×1 IMPLANT
GLOVE SS BIOGEL STRL SZ 6.5 (GLOVE) IMPLANT
GLOVE SS BIOGEL STRL SZ 8 (GLOVE) IMPLANT
GLOVE SS N UNI LF 8.5 STRL (GLOVE) ×2 IMPLANT
GLOVE SUPERSENSE BIOGEL SZ 6.5 (GLOVE) ×1
GLOVE SUPERSENSE BIOGEL SZ 8 (GLOVE) ×1
GOWN STRL REIN XL XLG (GOWN DISPOSABLE) ×8 IMPLANT
INST SET MINOR BONE (KITS) ×2 IMPLANT
K-WIRE 229MX1.6 (WIRE) IMPLANT
KIT ROOM TURNOVER APOR (KITS) ×2 IMPLANT
MANIFOLD NEPTUNE II (INSTRUMENTS) ×2 IMPLANT
NDL HYPO 21X1.5 SAFETY (NEEDLE) ×1 IMPLANT
NEEDLE HYPO 21X1.5 SAFETY (NEEDLE) ×2 IMPLANT
NS IRRIG 1000ML POUR BTL (IV SOLUTION) ×2 IMPLANT
PACK BASIC LIMB (CUSTOM PROCEDURE TRAY) ×2 IMPLANT
PAD ABD 5X9 TENDERSORB (GAUZE/BANDAGES/DRESSINGS) ×2 IMPLANT
PAD ARMBOARD 7.5X6 YLW CONV (MISCELLANEOUS) ×2 IMPLANT
PAD CAST 4YDX4 CTTN HI CHSV (CAST SUPPLIES) ×1 IMPLANT
PADDING CAST COTTON 4X4 STRL (CAST SUPPLIES)
PIN CAPS ORTHO GREEN .062 (PIN) IMPLANT
PLATE LEFT OLECRANON 3HOLE (Plate) ×1 IMPLANT
PUTTY DBX 1CC (Putty) ×2 IMPLANT
PUTTY DBX 1CC DEPUY (Putty) IMPLANT
SCREW HEX LOCK 2.7X16MM (Screw) ×2 IMPLANT
SCREW HEX NON LOCK 3.5X32MM (Screw) ×1 IMPLANT
SCREW HEX NON LOCK 3.5X55MM (Screw) ×1 IMPLANT
SCREW LOCK 18X2.7X HEXALOBE (Screw) IMPLANT
SCREW LOCKING 2.7X18MM (Screw) ×4 IMPLANT
SCREW NON LOCKING HEX 3.5X24 (Screw) ×2 IMPLANT
SCREW NON LOCKING HEX 3.5X45 (Screw) ×1 IMPLANT
SET BASIN LINEN APH (SET/KITS/TRAYS/PACK) ×2 IMPLANT
SPLINT IMMOBILIZER J 3INX20FT (CAST SUPPLIES)
SPLINT J IMMOBILIZER 3X20FT (CAST SUPPLIES) ×1 IMPLANT
SPONGE GAUZE 4X4 12PLY (GAUZE/BANDAGES/DRESSINGS) ×1 IMPLANT
SPONGE LAP 18X18 X RAY DECT (DISPOSABLE) ×2 IMPLANT
STAPLER VISISTAT 35W (STAPLE) ×2 IMPLANT
SUT MNCRL 0 VIOLET CTX 36 (SUTURE) ×2 IMPLANT
SUT MON AB 0 CT1 (SUTURE) ×1 IMPLANT
SUT MON AB 2-0 SH 27 (SUTURE) ×4
SUT MON AB 2-0 SH27 (SUTURE) IMPLANT
SUT MONOCRYL 0 CTX 36 (SUTURE) ×2
SUT PROLENE 3 0 PS 1 (SUTURE) IMPLANT
SWAB CULTURE LIQ STUART DBL (MISCELLANEOUS) ×1 IMPLANT
SYR 30ML LL (SYRINGE) ×2 IMPLANT
SYR BULB IRRIGATION 50ML (SYRINGE) ×2 IMPLANT
TOWEL OR 17X26 4PK STRL BLUE (TOWEL DISPOSABLE) ×1 IMPLANT
TUBE ANAEROBIC PORT A CUL  W/M (MISCELLANEOUS) ×1 IMPLANT

## 2012-10-19 NOTE — Anesthesia Procedure Notes (Signed)
Procedure Name: Intubation Date/Time: 10/19/2012 7:39 AM Performed by: Glynn Octave E Pre-anesthesia Checklist: Patient identified, Patient being monitored, Timeout performed, Emergency Drugs available and Suction available Patient Re-evaluated:Patient Re-evaluated prior to inductionOxygen Delivery Method: Circle System Utilized Preoxygenation: Pre-oxygenation with 100% oxygen Intubation Type: IV induction Ventilation: Mask ventilation without difficulty Laryngoscope Size: Mac and 3 Grade View: Grade I Tube type: Oral Tube size: 7.0 mm Number of attempts: 1 Airway Equipment and Method: stylet Placement Confirmation: ETT inserted through vocal cords under direct vision,  positive ETCO2 and breath sounds checked- equal and bilateral Secured at: 21 cm Tube secured with: Tape Dental Injury: Teeth and Oropharynx as per pre-operative assessment

## 2012-10-19 NOTE — Anesthesia Postprocedure Evaluation (Signed)
  Anesthesia Post-op Note  Patient: Ryan [name]  Procedure(s) Performed: Procedure(s): OPEN REDUCTION INTERNAL FIXATION (ORIF) ELBOW/OLECRANON FRACTURE (Left) IRRIGATION AND DEBRIDEMENT EXTREMITY (Left)  Patient Location: PACU  Anesthesia Type:General  Level of Consciousness: awake, alert  and oriented  Airway and Oxygen Therapy: Patient Spontanous Breathing  Post-op Pain: none  Post-op Assessment: Post-op Vital signs reviewed, Patient's Cardiovascular Status Stable, Respiratory Function Stable, Patent Airway and No signs of Nausea or vomiting  Post-op Vital Signs: Reviewed and stable  Complications: No apparent anesthesia complications

## 2012-10-19 NOTE — Op Note (Signed)
10/19/2012  8:56 AM  PATIENT:  Ryan [name]  67 y.o. male  PRE-OPERATIVE DIAGNOSIS:  non union olecranon left   POST-OPERATIVE DIAGNOSIS:  non union olecranon left   PROCEDURE:  Procedure(s): OPEN REDUCTION INTERNAL FIXATION (ORIF) ELBOW/OLECRANON FRACTURE (Left) IRRIGATION AND DEBRIDEMENT EXTREMITY (Left) And 1cc bone graft putty Acumed periarticular plat  SURGEON:  Surgeon(s) and Role:    * Vickki Hearing, MD - Primary  PHYSICIAN ASSISTANT:   ASSISTANTS: Wayne McFatter    Details of procedure: The patient was identified in the preoperative area and the left elbow was confirmed as a surgical sign of marked with the surgeon's initials. The chart was reviewed and updated. The patient was taken to surgery. Patient was given appropriate antibiotics based on his weight. He was given general anesthesia in the supine position without complication. Left arm was prepped and draped sterilely with a tourniquet applied proximally. A timeout procedure was executed.  The limb was exsanguinated with a 4 inch Esmarch and the tourniquet was elevated to 250 mmHg where it stayed for approximately 50 minutes. The most recent skin incision was used and the incision was extended proximally and distally with sharp dissection carried down to muscle. Subperiosteal dissection exposed the distal part of the fracture there was a gap at the fracture site exposed to the joint the joint was irrigated and debridement cultures were obtained.  The bone ends were prepared by debridement with a rongeur and several drill holes made with a 2.0 drill bit. Reduction was obtained with extension and held with a bone clamp. An Acumed plate was then applied to the bone per manufacture technique starting with a 3.5 nonlocking screw drilled with a 2.8 to the in the slotted hole of the plate. The proximal portion of the plate was applied over the olecranon and triceps tendon. The home run screw was applied and checked with x-ray  capturing the far cortex of the ulna. The plate was in good position the fracture was reduced. Several 2.7 nonlocking screws were placed proximally and then the final 2 screws were placed in compression mode. Final x-rays were obtained which confirmed the reduction and plate position is satisfactory. 1 cc of bone putty was placed at the fracture site. The wounds were irrigated and closed with one layer of 2-0 Monocryl and the periosteum. A second layer in the subcutaneous tissue. We then injected 30 cc of Marcaine with epinephrine proximally and an additional 30 cc of Marcaine with epinephrine distally.  A dressing was applied. The tourniquet was released. The color was noted in the hand strong radial pulse was palpated.  The patient was extubated and taken to recovery room in stable condition.  ANESTHESIA:   general  EBL:  Total I/O In: 500 [I.V.:500] Out: 0   BLOOD ADMINISTERED:none  DRAINS: none   LOCAL MEDICATIONS USED:  MARCAINE 0.5% WITH EPI and Amount: 60 ml  SPECIMEN:  Source of Specimen:  JOINT CULTURE LEFT ELBOW   DISPOSITION OF SPECIMEN:  MICROBIOLOGY  COUNTS:  YES  TOURNIQUET:   Total Tourniquet Time Documented: Upper Arm (Left) - 56 minutes Total: Upper Arm (Left) - 56 minutes   DICTATION: .Reubin Milan Dictation  PLAN OF CARE: Discharge to home after PACU  PATIENT DISPOSITION:  PACU - hemodynamically stable.   Delay start of Pharmacological VTE agent (>24hrs) due to surgical blood loss or risk of bleeding: not applicable

## 2012-10-19 NOTE — Interval H&P Note (Signed)
History and Physical Interval Note:  10/19/2012 7:19 AM  Ryan [name]  has presented today for surgery, with the diagnosis of non union olecranon left   The various methods of treatment have been discussed with the patient and family. After consideration of risks, benefits and other options for treatment, the patient has consented to  Procedure(s): OPEN REDUCTION INTERNAL FIXATION (ORIF) ELBOW/OLECRANON FRACTURE (Left) as a surgical intervention .  The patient's history has been reviewed, patient examined, no change in status, stable for surgery.  I have reviewed the patient's chart and labs.  Questions were answered to the patient's satisfaction.     Fuller Canada

## 2012-10-19 NOTE — Brief Op Note (Addendum)
10/19/2012  8:56 AM  PATIENT:  Ryan [name]  67 y.o. male  PRE-OPERATIVE DIAGNOSIS:  non union olecranon left   POST-OPERATIVE DIAGNOSIS:  non union olecranon left   PROCEDURE:  Procedure(s): OPEN REDUCTION INTERNAL FIXATION (ORIF) ELBOW/OLECRANON FRACTURE (Left) IRRIGATION AND DEBRIDEMENT EXTREMITY (Left) And 1cc bone graft putty Acumed periarticular plat  SURGEON:  Surgeon(s) and Role:    * Vickki Hearing, MD - Primary  PHYSICIAN ASSISTANT:   ASSISTANTS: Wayne McFatter    ANESTHESIA:   general  EBL:  Total I/O In: 500 [I.V.:500] Out: 0   BLOOD ADMINISTERED:none  DRAINS: none   LOCAL MEDICATIONS USED:  MARCAINE 0.5% WITH EPI and Amount: 60 ml  SPECIMEN:  Source of Specimen:  JOINT CULTURE LEFT ELBOW   DISPOSITION OF SPECIMEN:  MICROBIOLOGY  COUNTS:  YES  TOURNIQUET:   Total Tourniquet Time Documented: Upper Arm (Left) - 56 minutes Total: Upper Arm (Left) - 56 minutes   DICTATION: .Reubin Milan Dictation  PLAN OF CARE: Discharge to home after PACU  PATIENT DISPOSITION:  PACU - hemodynamically stable.   Delay start of Pharmacological VTE agent (>24hrs) due to surgical blood loss or risk of bleeding: not applicable
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
## 2012-10-19 NOTE — Anesthesia Preprocedure Evaluation (Addendum)
Anesthesia Evaluation  Patient identified by MRN, date of birth, ID band Patient awake    Reviewed: Allergy & Precautions, H&P , NPO status , Patient's Chart, lab work & pertinent test results  Airway Mallampati: II TM Distance: >3 FB Neck ROM: Full    Dental  (+) Edentulous Upper   Pulmonary Current Smoker,    Pulmonary exam normal       Cardiovascular hypertension, negative cardio ROS  Rhythm:Regular Rate:Normal     Neuro/Psych negative neurological ROS  negative psych ROS   GI/Hepatic negative GI ROS, Neg liver ROS,   Endo/Other  negative endocrine ROS  Renal/GU negative Renal ROS  negative genitourinary   Musculoskeletal   Abdominal Normal abdominal exam  (+)   Peds  Hematology negative hematology ROS (+)   Anesthesia Other Findings   Reproductive/Obstetrics                           Anesthesia Physical Anesthesia Plan  ASA: II  Anesthesia Plan: General   Post-op Pain Management:    Induction: Intravenous  Airway Management Planned: Oral ETT  Additional Equipment:   Intra-op Plan:   Post-operative Plan: Extubation in OR  Informed Consent: I have reviewed the patients History and Physical, chart, labs and discussed the procedure including the risks, benefits and alternatives for the proposed anesthesia with the patient or authorized representative who has indicated his/her understanding and acceptance.     Plan Discussed with:   Anesthesia Plan Comments:        Anesthesia Quick Evaluation

## 2012-10-19 NOTE — Transfer of Care (Signed)
Immediate Anesthesia Transfer of Care Note  Patient: Ryan [name]  Procedure(s) Performed: Procedure(s): OPEN REDUCTION INTERNAL FIXATION (ORIF) ELBOW/OLECRANON FRACTURE (Left) IRRIGATION AND DEBRIDEMENT EXTREMITY (Left)  Patient Location: PACU  Anesthesia Type:General  Level of Consciousness: awake, alert  and oriented  Airway & Oxygen Therapy: Patient Spontanous Breathing and Patient connected to face mask oxygen  Post-op Assessment: Report given to PACU RN  Post vital signs: Reviewed and stable  Complications: No apparent anesthesia complications

## 2012-10-21 LAB — WOUND CULTURE: Culture: NO GROWTH

## 2012-10-22 ENCOUNTER — Ambulatory Visit (INDEPENDENT_AMBULATORY_CARE_PROVIDER_SITE_OTHER): Payer: Medicare Other | Admitting: Orthopedic Surgery

## 2012-10-22 ENCOUNTER — Encounter: Payer: Self-pay | Admitting: Orthopedic Surgery

## 2012-10-22 VITALS — BP 135/89 | Ht 68.0 in | Wt 176.0 lb

## 2012-10-22 DIAGNOSIS — IMO0002 Reserved for concepts with insufficient information to code with codable children: Secondary | ICD-10-CM

## 2012-10-22 DIAGNOSIS — S52022K Displaced fracture of olecranon process without intraarticular extension of left ulna, subsequent encounter for closed fracture with nonunion: Secondary | ICD-10-CM

## 2012-10-22 NOTE — Progress Notes (Signed)
Patient ID: Ryan [name], male   DOB: February 05, 1946, 67 y.o.   MRN: [id number] Chief Complaint  Patient presents with  . Follow-up    Post op #1, left elbow. DOS 10-19-12.    Postop visit #1 postop day 3 open treatment internal fixation irrigation debridement left elbow chronic nonunion with Acumed plate fixation CX NEGATIVE   There is some swelling of the medial side of the elbow the suture line looks clean dry and intact  The patient is advised to ice continue with compression dressing and a sling  Return for x-ray and staple removal

## 2012-10-22 NOTE — Patient Instructions (Signed)
ICE   [facility]

## 2012-10-24 LAB — ANAEROBIC CULTURE

## 2012-10-27 ENCOUNTER — Ambulatory Visit (INDEPENDENT_AMBULATORY_CARE_PROVIDER_SITE_OTHER): Payer: Medicare Other | Admitting: Orthopedic Surgery

## 2012-10-27 ENCOUNTER — Encounter: Payer: Self-pay | Admitting: Orthopedic Surgery

## 2012-10-27 VITALS — BP 130/84 | Ht 68.0 in | Wt 176.0 lb

## 2012-10-27 DIAGNOSIS — IMO0002 Reserved for concepts with insufficient information to code with codable children: Secondary | ICD-10-CM

## 2012-10-27 NOTE — Progress Notes (Signed)
Patient ID: Ryan [name], male   DOB: [birth year], 67 y.o.   MRN: [id number] Chief Complaint  Patient presents with  . Wound Check    wound check   BP 130/84  Ht 5\' 8"  (1.727 m)  Wt 176 lb (79.833 kg)  BMI 26.77 kg/m2  The patient came in because the advanced home care nurse thought she saw drainage. She may have saw drainage but it's not unusual with this procedure and amount of surgery that he said. The wound looks pristine  Is not having any increased pain cellulitis or redness around the area.  Keep previous appointment for staples and x-ray

## 2012-10-27 NOTE — Patient Instructions (Addendum)
No change in treatment

## 2012-11-02 ENCOUNTER — Ambulatory Visit (INDEPENDENT_AMBULATORY_CARE_PROVIDER_SITE_OTHER): Payer: Medicare Other

## 2012-11-02 ENCOUNTER — Ambulatory Visit (INDEPENDENT_AMBULATORY_CARE_PROVIDER_SITE_OTHER): Payer: Medicare Other | Admitting: Orthopedic Surgery

## 2012-11-02 VITALS — BP 118/71 | Ht 68.0 in | Wt 176.0 lb

## 2012-11-02 DIAGNOSIS — S52022K Displaced fracture of olecranon process without intraarticular extension of left ulna, subsequent encounter for closed fracture with nonunion: Secondary | ICD-10-CM

## 2012-11-02 DIAGNOSIS — IMO0002 Reserved for concepts with insufficient information to code with codable children: Secondary | ICD-10-CM

## 2012-11-02 NOTE — Progress Notes (Signed)
Patient ID: Ryan [name], male   DOB: 05-18-1945, 67 y.o.   MRN: [id number] Post op left elbow repair nonunion with bone graft with Acumed plating locking plate  At this point and the wound has healed the staples were removed.  The xrays show some loss of fixation but he is asymptomatic so if that continues we will leave things as they are. His range of motion is 30-125  No pain no wound problems  Return 4 weeks for x-ray

## 2012-11-11 ENCOUNTER — Encounter: Payer: Self-pay | Admitting: Orthopedic Surgery

## 2012-12-01 ENCOUNTER — Encounter: Payer: Self-pay | Admitting: Orthopedic Surgery

## 2012-12-01 ENCOUNTER — Ambulatory Visit (INDEPENDENT_AMBULATORY_CARE_PROVIDER_SITE_OTHER): Payer: Medicare Other | Admitting: Orthopedic Surgery

## 2012-12-01 ENCOUNTER — Ambulatory Visit (INDEPENDENT_AMBULATORY_CARE_PROVIDER_SITE_OTHER): Payer: Medicare Other

## 2012-12-01 VITALS — Ht 68.0 in | Wt 176.0 lb

## 2012-12-01 DIAGNOSIS — S52022K Displaced fracture of olecranon process without intraarticular extension of left ulna, subsequent encounter for closed fracture with nonunion: Secondary | ICD-10-CM

## 2012-12-01 DIAGNOSIS — IMO0002 Reserved for concepts with insufficient information to code with codable children: Secondary | ICD-10-CM

## 2012-12-01 NOTE — Progress Notes (Signed)
Patient ID: Ryan [name], male   DOB: 1945-05-11, 67 y.o.   MRN: [id number]  Chief Complaint  Patient presents with  . Follow-up    4 week recheck left elbow with xrays DOS 10/19/12    Postop week #6  The patient has no complaints he has near full range of motion no drainage no wound problems  His x-ray shows persistent nonunion  My advice to the patient is to leave everything as it is an only perform further intervention if needed  Followup in 6 weeks

## 2013-01-12 ENCOUNTER — Encounter: Payer: Self-pay | Admitting: Orthopedic Surgery

## 2013-01-12 ENCOUNTER — Ambulatory Visit (INDEPENDENT_AMBULATORY_CARE_PROVIDER_SITE_OTHER): Payer: Self-pay | Admitting: Orthopedic Surgery

## 2013-01-12 VITALS — BP 151/96 | Ht 68.0 in | Wt 176.0 lb

## 2013-01-12 DIAGNOSIS — IMO0002 Reserved for concepts with insufficient information to code with codable children: Secondary | ICD-10-CM

## 2013-01-12 NOTE — Patient Instructions (Signed)
Remember do not do any heavy lifting

## 2013-01-12 NOTE — Progress Notes (Signed)
Patient ID: Ryan [name], male   DOB: 07/04/1945, 67 y.o.   MRN: [id number]  Chief Complaint  Patient presents with  . Follow-up    6 week recheck left elbow/[7.28.2014]    Treatment internal fixation left elbow with Acumed plate for chronic nonunion  The patient's range of motion is currently 20-120 he has no pain and no swelling good hand function is noted  His x-ray does show loss of fixation of the olecranon but we've decided to accept this  He is doing well followup in 3 months

## 2013-04-13 ENCOUNTER — Encounter: Payer: Self-pay | Admitting: Orthopedic Surgery

## 2013-04-13 ENCOUNTER — Ambulatory Visit: Payer: Medicare Other | Admitting: Orthopedic Surgery

## 2013-05-11 ENCOUNTER — Ambulatory Visit: Payer: Medicare Other | Admitting: Orthopedic Surgery

## 2013-05-25 ENCOUNTER — Ambulatory Visit (INDEPENDENT_AMBULATORY_CARE_PROVIDER_SITE_OTHER): Payer: Medicare Other | Admitting: Orthopedic Surgery

## 2013-05-25 ENCOUNTER — Encounter: Payer: Self-pay | Admitting: Orthopedic Surgery

## 2013-05-25 VITALS — BP 141/81 | Ht 68.0 in | Wt 176.0 lb

## 2013-05-25 DIAGNOSIS — IMO0002 Reserved for concepts with insufficient information to code with codable children: Secondary | ICD-10-CM

## 2013-05-25 NOTE — Progress Notes (Signed)
Patient ID: Ryan FarrStuart D [name], male   DOB: June 24, 1945, 68 y.o.   MRN: [id number] Chief Complaint  Patient presents with  . Follow-up    3 month recheck left elbow DOS 10/19/12    This is actually post op month #7 status post open treatment internal fixation of a fibrous nonunion of his left olecranon. His olecranon lost fixation but his range of motion stability of his elbow and strength are all normal except for some slight decreased biceps strength  No effusions no swelling no tenderness no signs of infection  Recommend routine followup  He would like to be seen in June to arrange surgery on his right knee for knee replacement. We will do an x-ray at that time.

## 2013-09-09 ENCOUNTER — Ambulatory Visit (INDEPENDENT_AMBULATORY_CARE_PROVIDER_SITE_OTHER): Payer: Medicare Other | Admitting: Orthopedic Surgery

## 2013-09-09 ENCOUNTER — Ambulatory Visit (INDEPENDENT_AMBULATORY_CARE_PROVIDER_SITE_OTHER): Payer: Medicare Other

## 2013-09-09 ENCOUNTER — Encounter: Payer: Self-pay | Admitting: Orthopedic Surgery

## 2013-09-09 VITALS — BP 134/80 | Ht 68.0 in | Wt 176.0 lb

## 2013-09-09 DIAGNOSIS — M25569 Pain in unspecified knee: Secondary | ICD-10-CM

## 2013-09-09 DIAGNOSIS — Z96659 Presence of unspecified artificial knee joint: Secondary | ICD-10-CM

## 2013-09-09 DIAGNOSIS — M179 Osteoarthritis of knee, unspecified: Secondary | ICD-10-CM

## 2013-09-09 DIAGNOSIS — IMO0002 Reserved for concepts with insufficient information to code with codable children: Secondary | ICD-10-CM

## 2013-09-09 DIAGNOSIS — M1710 Unilateral primary osteoarthritis, unspecified knee: Secondary | ICD-10-CM

## 2013-09-09 DIAGNOSIS — M25561 Pain in right knee: Secondary | ICD-10-CM

## 2013-09-09 DIAGNOSIS — Z9889 Other specified postprocedural states: Secondary | ICD-10-CM

## 2013-09-09 DIAGNOSIS — M25559 Pain in unspecified hip: Secondary | ICD-10-CM

## 2013-09-09 NOTE — Patient Instructions (Addendum)
Call to arrange therapy at [facility] 
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
## 2013-09-23 ENCOUNTER — Ambulatory Visit (HOSPITAL_COMMUNITY)
Admission: RE | Admit: 2013-09-23 | Discharge: 2013-09-23 | Disposition: A | Discharge status: Home / Self Care | Payer: Medicare Other | Source: Ambulatory Visit | Attending: Orthopedic Surgery | Admitting: Orthopedic Surgery

## 2013-09-23 DIAGNOSIS — M549 Dorsalgia, unspecified: Secondary | ICD-10-CM | POA: Insufficient documentation

## 2013-09-23 DIAGNOSIS — M25559 Pain in unspecified hip: Secondary | ICD-10-CM | POA: Diagnosis not present

## 2013-09-23 DIAGNOSIS — R29898 Other symptoms and signs involving the musculoskeletal system: Secondary | ICD-10-CM

## 2013-09-23 DIAGNOSIS — R262 Difficulty in walking, not elsewhere classified: Secondary | ICD-10-CM | POA: Diagnosis not present

## 2013-09-23 DIAGNOSIS — IMO0001 Reserved for inherently not codable concepts without codable children: Secondary | ICD-10-CM | POA: Insufficient documentation

## 2013-09-23 DIAGNOSIS — M5442 Lumbago with sciatica, left side: Secondary | ICD-10-CM
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
## 2013-09-28 ENCOUNTER — Ambulatory Visit (HOSPITAL_COMMUNITY)
Admission: RE | Admit: 2013-09-28 | Discharge: 2013-09-28 | Disposition: A | Discharge status: Home / Self Care | Payer: Medicare Other | Source: Ambulatory Visit | Attending: Orthopedic Surgery | Admitting: Orthopedic Surgery

## 2013-09-28 DIAGNOSIS — IMO0001 Reserved for inherently not codable concepts without codable children: Secondary | ICD-10-CM | POA: Diagnosis not present

## 2013-09-28 NOTE — Progress Notes (Signed)
Physical Therapy Treatment Patient Details  Name: Ryan [name] MRN: [id number] Date of Birth: [birth year]  Today's Date: 09/28/2013 Time: 0932-1014 PT Time Calculation (min): 42 min  Visit#: 2 of 8  Re-eval: 10/23/13 Authorization: medicare  Authorization Visit#: 2 of 10  Charges:  therex 42  Subjective: Symptoms/Limitations Symptoms: Pt reports Lt hip pain today of 4-5/10 today.  Reports complaince with HEP.  States he hopes to have Rt TKR in August. Pain Assessment Currently in Pain?: Yes Pain Score: 5  Pain Location: Hip Pain Orientation: Left   Exercise/Treatments Stretches Active Hamstring Stretch: 3 reps;20 seconds;Limitations Active Hamstring Stretch Limitations: standing 8" box Passive Hamstring Stretch: Limitations Passive Hamstring Stretch Limitations: gastroc stretch slant board 3X20" both Single Knee to Chest Stretch: 2 reps;20 seconds Aerobic Stationary Bike: nustep 8' level 3 hills #2 LE only Supine Ab Set: 10 reps;5 seconds Clam: 10 reps Bent Knee Raise: 10 reps Bridge: 10 reps Straight Leg Raise: 10 reps Prone  Straight Leg Raise: 10 reps Other Prone Lumbar Exercises: heelsqueeze 10X5" holds    Physical Therapy Assessment and Plan PT Assessment and Plan Clinical Impression Statement: Progressed to standing stretches, prone and supine stabilization exercises.  Pt with max vc's with stab exercises due to tendency to perform too quickly with decreased control.  Pt reported overall improved symptoms at end of session. PT Plan: Add prone press ups and standing stab exercises.  Progress to education in proper body mechanics.     Problem List Patient Active Problem List   Diagnosis Date Noted  . Back pain 09/23/2013  . Bilateral leg weakness 09/23/2013  . Difficulty in walking(719.7) 09/23/2013  . Olecranon fracture 09/29/2012  . Draining cutaneous sinus tract 08/24/2012  . Septic joint of left elbow 08/24/2012  . Fracture of ulna, olecranon  08/18/2012  . Elbow locking 02/18/2012  . Postoperative wound breakdown 07/02/2011  . Fibrous non-union 06/03/2011  . Bursitis of elbow 06/03/2011  . Olecranon bursitis 05/06/2011  . JOINT EFFUSION, RIGHT KNEE 06/06/2010  . LOOSE BODY-KNEE 04/09/2010  . OSTEOCHONDRITIS DESSICANS 04/09/2010  . ARTHROSCOPY, RIGHT KNEE, HX OF 04/09/2010  . TEAR MEDIAL MENISCUS 04/04/2010  . TOTAL KNEE FOLLOW-UP 09/29/2008  . OLD DISRUPTION OF POSTERIOR CRUCIATE LIGAMENT 10/14/2007  . DEGENERATIVE JOINT DISEASE, KNEE 02/26/2007  . ENTHESOPATHY, KNEE NEC 01/19/2007    PT - End of Session Activity Tolerance: Patient tolerated treatment well General Behavior During Therapy: WFL for tasks assessed/performed PT Plan of Care PT Home Exercise Plan: given  GP Functional Limitation: Mobility: Walking and moving around  Ryan [name], PTA/CLT 09/28/2013, 10:17 AM

## 2013-10-05 ENCOUNTER — Ambulatory Visit (HOSPITAL_COMMUNITY)
Admission: RE | Admit: 2013-10-05 | Discharge: 2013-10-05 | Disposition: A | Discharge status: Home / Self Care | Payer: Medicare Other | Source: Ambulatory Visit | Attending: Orthopedic Surgery | Admitting: Orthopedic Surgery

## 2013-10-05 DIAGNOSIS — R262 Difficulty in walking, not elsewhere classified: Secondary | ICD-10-CM

## 2013-10-05 DIAGNOSIS — M5442 Lumbago with sciatica, left side: Secondary | ICD-10-CM

## 2013-10-05 DIAGNOSIS — IMO0001 Reserved for inherently not codable concepts without codable children: Secondary | ICD-10-CM | POA: Diagnosis not present

## 2013-10-05 DIAGNOSIS — R29898 Other symptoms and signs involving the musculoskeletal system: Secondary | ICD-10-CM

## 2013-10-05 NOTE — Progress Notes (Signed)
Physical Therapy Treatment Patient Details  Name: Ryan [name] MRN: [id number] Date of Birth: Aug 02, 1945  Today's Date: 10/05/2013 Time: 4098-11911515-1604 PT Time Calculation (min): 49 min Charge: [id number]  Visit#: 3 of 8  Re-eval: 10/23/13 Assessment Diagnosis: Lt hip pain Next MD Visit: Romeo AppleHarrison 10/20/2014 Prior Therapy: for Lt TKR   Authorization: medicare  Authorization Time Period:    Authorization Visit#: 3 of 10   Subjective: Symptoms/Limitations Symptoms: t stated he has not done some of the HEP due to increased Rt knee pain with exericse, Lt hip is feeling good. Pain Assessment Currently in Pain?: Yes Pain Score: 4  Pain Location: Knee Pain Orientation: Right   Exercise/Treatments Stretches Active Hamstring Stretch: 3 reps;20 seconds;Limitations Active Hamstring Stretch Limitations: standing 14in box 3 direction Prone on Elbows Stretch: 2 reps;60 seconds Standing Other Standing Lumbar Exercises: slant board 3x 30" gastroc stretch Other Standing Lumbar Exercises: 3D hip excursion 10x Supine Ab Set: 10 reps;5 seconds Clam: 10 reps Bent Knee Raise: 10 reps Bridge: 10 reps Straight Leg Raise: 10 reps;Limitations Straight Leg Raises Limitations: floating Prone  Straight Leg Raise: 10 reps Other Prone Lumbar Exercises: heelsqueeze 10X5" holds Other Prone Lumbar Exercises: POE 2x 30"     Physical Therapy Assessment and Plan PT Assessment and Plan Clinical Impression Statement: Progressed core and hip strengtheing exercises and add 3D hip excursion exercises to improve hip mobilty.  Pt able to complete exercises with min cueing for form and technique to improve stability wtih exercises.  Good form noted with squats this session.  No c/o Rt knee pain through session/ PT Plan: f/u on knee pain following this session.  Continue to improve core and hip stretngthening/stabilty.  Progress to education in proper body mechanics when readty.    Goals PT Short Term  Goals PT Short Term Goal 1: Pt pain to be no greater than a 6/10 PT Short Term Goal 1 - Progress: Progressing toward goal PT Short Term Goal 2: Pt to have full ROM to allow pt to squat down to pick an item off the floor PT Short Term Goal 2 - Progress: Progressing toward goal PT Short Term Goal 3: Pt to be able to verbalize the importance of posture in back care. PT Short Term Goal 3 - Progress: Progressing toward goal PT Long Term Goals PT Long Term Goal 1: Pt I in advance HEP PT Long Term Goal 2: Pt pain to be no greater than a 2/10 Long Term Goal 3: Pt to state that he is able to walk for 30 minutes without increased back or Lt leg pain (may have Rt knee pain due to DJD) Long Term Goal 4: Pt mm strength to be increased to 5/5  Problem List Patient Active Problem List   Diagnosis Date Noted  . Back pain 09/23/2013  . Bilateral leg weakness 09/23/2013  . Difficulty in walking(719.7) 09/23/2013  . Olecranon fracture 09/29/2012  . Draining cutaneous sinus tract 08/24/2012  . Septic joint of left elbow 08/24/2012  . Fracture of ulna, olecranon 08/18/2012  . Elbow locking 02/18/2012  . Postoperative wound breakdown 07/02/2011  . Fibrous non-union 06/03/2011  . Bursitis of elbow 06/03/2011  . Olecranon bursitis 05/06/2011  . JOINT EFFUSION, RIGHT KNEE 06/06/2010  . LOOSE BODY-KNEE 04/09/2010  . OSTEOCHONDRITIS DESSICANS 04/09/2010  . ARTHROSCOPY, RIGHT KNEE, HX OF 04/09/2010  . TEAR MEDIAL MENISCUS 04/04/2010  . TOTAL KNEE FOLLOW-UP 09/29/2008  . OLD DISRUPTION OF POSTERIOR CRUCIATE LIGAMENT 10/14/2007  . DEGENERATIVE JOINT DISEASE, KNEE  02/26/2007  . ENTHESOPATHY, KNEE NEC 01/19/2007    PT - End of Session Activity Tolerance: Patient tolerated treatment well General Behavior During Therapy: [facility] for tasks assessed/performed  GP    Juel Burrow 10/05/2013, 4:01 PM

## 2013-10-07 ENCOUNTER — Ambulatory Visit (HOSPITAL_COMMUNITY)
Admission: RE | Admit: 2013-10-07 | Discharge: 2013-10-07 | Disposition: A | Discharge status: Home / Self Care | Payer: Medicare Other | Source: Ambulatory Visit | Attending: Orthopedic Surgery | Admitting: Orthopedic Surgery

## 2013-10-07 DIAGNOSIS — IMO0001 Reserved for inherently not codable concepts without codable children: Secondary | ICD-10-CM | POA: Diagnosis not present

## 2013-10-07 NOTE — Progress Notes (Signed)
Physical Therapy Treatment Patient Details  Name: Ryan [name] MRN: [id number] Date of Birth: Jul 27, 1945  Today's Date: 10/07/2013 Time: [id number] PT Time Calculation (min): 40 min Visit#: 4 of 8  Re-eval: 10/23/13 Authorization: medicare  Authorization Visit#: 4 of 10  Charges:  therex 40  Subjective: Symptoms/Limitations Symptoms: Pt ststas he is doing well.  States he is not havnig any pain today, however states his knee usually begins to hurt after therapy and hurts up until the next day.  Pt reports a little discomfort in his back today.    Exercise/Treatments Stretches Active Hamstring Stretch: 3 reps;20 seconds;Limitations Active Hamstring Stretch Limitations: standing 14in box 3 direction Passive Hamstring Stretch: Limitations Passive Hamstring Stretch Limitations: gastroc stretch slant board 3X20" both Prone on Elbows Stretch: 2 reps;60 seconds Aerobic Stationary Bike: nustep 8' level 3 hills #2 LE only Standing Other Standing Lumbar Exercises: 3D hip excursion 10x Supine Ab Set: 10 reps;5 seconds Clam: 10 reps Bent Knee Raise: 10 reps Bridge: 10 reps Straight Leg Raise: 10 reps;Limitations Straight Leg Raises Limitations: floating Prone  Other Prone Lumbar Exercises: heelsqueeze 10X5" holds Other Prone Lumbar Exercises: POE 2x 30"    Physical Therapy Assessment and Plan PT Assessment and Plan Clinical Impression Statement: Continued to focus on core and hip strengthening exercises.  Pt with improving control and form with therex.  No complaints of pain during sessionl PT Plan: Continue to improve core and hip stretngthening/stabilty.  Add forward lunges and step ups next session.  Progress to education in proper body mechanics when readty.     Problem List Patient Active Problem List   Diagnosis Date Noted  . Back pain 09/23/2013  . Bilateral leg weakness 09/23/2013  . Difficulty in walking(719.7) 09/23/2013  . Olecranon fracture 09/29/2012  . Draining  cutaneous sinus tract 08/24/2012  . Septic joint of left elbow 08/24/2012  . Fracture of ulna, olecranon 08/18/2012  . Elbow locking 02/18/2012  . Postoperative wound breakdown 07/02/2011  . Fibrous non-union 06/03/2011  . Bursitis of elbow 06/03/2011  . Olecranon bursitis 05/06/2011  . JOINT EFFUSION, RIGHT KNEE 06/06/2010  . LOOSE BODY-KNEE 04/09/2010  . OSTEOCHONDRITIS DESSICANS 04/09/2010  . ARTHROSCOPY, RIGHT KNEE, HX OF 04/09/2010  . TEAR MEDIAL MENISCUS 04/04/2010  . TOTAL KNEE FOLLOW-UP 09/29/2008  . OLD DISRUPTION OF POSTERIOR CRUCIATE LIGAMENT 10/14/2007  . DEGENERATIVE JOINT DISEASE, KNEE 02/26/2007  . ENTHESOPATHY, KNEE NEC 01/19/2007    PT - End of Session Activity Tolerance: Patient tolerated treatment well General Behavior During Therapy: [facility] assessed/performed  GP    Lurena NidaAmy B Jorita Bohanon, PTA/CLT 10/07/2013, 3:19 PM

## 2013-10-11 ENCOUNTER — Other Ambulatory Visit: Payer: Self-pay | Admitting: *Deleted

## 2013-10-11 ENCOUNTER — Ambulatory Visit (INDEPENDENT_AMBULATORY_CARE_PROVIDER_SITE_OTHER): Payer: Medicare Other | Admitting: Orthopedic Surgery

## 2013-10-11 VITALS — BP 142/84 | Ht 68.0 in | Wt 176.0 lb

## 2013-10-11 DIAGNOSIS — M25551 Pain in right hip: Secondary | ICD-10-CM

## 2013-10-11 DIAGNOSIS — M25559 Pain in unspecified hip: Secondary | ICD-10-CM

## 2013-10-11 DIAGNOSIS — M1711 Unilateral primary osteoarthritis, right knee: Secondary | ICD-10-CM

## 2013-10-11 DIAGNOSIS — M1710 Unilateral primary osteoarthritis, unspecified knee: Secondary | ICD-10-CM

## 2013-10-11 NOTE — Progress Notes (Signed)
Patient ID: Ryan [name], male   DOB: Nov 14, 1945, 68 y.o.   MRN: [id number]  Chief Complaint  Patient presents with  . Follow-up    3 week recheck right knee s/p physical therapy    Followup visit status post physical therapy the patient was having some hip pain we diagnosed him with degenerative disc disease. He is ready to schedule surgery for his right total knee replacement  And reviewed complains of severe pain in his right knee loss of function in activities of daily living. X-rays show severe arthritis with medial compartment compromise.  Review of systems otherwise healthy no complaints left knee doing well left elbow doing well. Physical therapy seems to be helping his back pain.   General the patient is well-developed and well-nourished grooming and hygiene are normal Oriented x3 Mood and affect normal Inspection of the right knee shows effusion limited range of motion  All joints are stable Motor exam is normal Skin clean dry and intact  Cardiovascular exam is normal Sensory exam normal   Degenerative disc disease lumbar spine improving with physical therapy Osteoarthritis right knee scheduled for total knee replacement

## 2013-10-11 NOTE — Patient Instructions (Signed)
Will schedule knee replacement Aug 5, we will call you with pre op appt

## 2013-10-12 ENCOUNTER — Ambulatory Visit (HOSPITAL_COMMUNITY)
Admission: RE | Admit: 2013-10-12 | Discharge: 2013-10-12 | Disposition: A | Discharge status: Home / Self Care | Payer: Medicare Other | Source: Ambulatory Visit | Attending: Physical Therapy | Admitting: Physical Therapy

## 2013-10-12 ENCOUNTER — Other Ambulatory Visit: Payer: Medicare Other | Admitting: *Deleted

## 2013-10-12 DIAGNOSIS — R262 Difficulty in walking, not elsewhere classified: Secondary | ICD-10-CM

## 2013-10-12 DIAGNOSIS — IMO0001 Reserved for inherently not codable concepts without codable children: Secondary | ICD-10-CM | POA: Diagnosis not present

## 2013-10-12 DIAGNOSIS — M5442 Lumbago with sciatica, left side: Secondary | ICD-10-CM

## 2013-10-12 DIAGNOSIS — R29898 Other symptoms and signs involving the musculoskeletal system: Secondary | ICD-10-CM

## 2013-10-12 MED ORDER — BUPIVACAINE LIPOSOME 1.3 % IJ SUSP: 20.0000 mL | Freq: Once | INTRAMUSCULAR | Status: DC

## 2013-10-12 NOTE — Progress Notes (Signed)
Physical Therapy Treatment Patient Details  Name: Ryan [name] MRN: [id number] Date of Birth: 1945/04/16  Today's Date: 10/12/2013 Time: 4098-11911436-1515 PT Time Calculation (min): 39 min Charge:  There ex [id number]; manual [id number]  Visit#: 5 of 8  Re-eval: 10/23/13   Authorization: medicare  Authorization Visit#: 5 of 8   Subjective: Symptoms/Limitations Symptoms: Pt states that he went to the MD and they have scheduled his Rt TKR.  Lt hip is feeling better since therapy but he is unable to do prolong standing activity secondary to Rt knee being bone on bone. Pain Assessment Pain Score: 2  Pain Location: Back   Exercise/Treatments Stretches Active Hamstring Stretch: 3 reps;30 seconds Prone on Elbows Stretch: 2 reps;60 seconds Press Ups: 5 reps Standing Scapular Retraction: 10 reps;Theraband Theraband Level (Scapular Retraction): Level 3 (Green) Row: 10 reps;Theraband Theraband Level (Row): Level 3 (Green) Shoulder Extension: 10 reps;Theraband Theraband Level (Shoulder Extension): Level 3 (Green)  Supine Bridge: 10 reps   Prone  Straight Leg Raise: 10 reps Opposite Arm/Leg Raise: 10 reps Other Prone Lumbar Exercises: rows; B shoulder extension with 2# x 10 @     Manual Therapy Manual Therapy: Joint mobilization Joint Mobilization: Grade II and III mobs to thoracic and lumbar spine to increase mobility as well as paraspinal mm soft tissue work  Physical Therapy Assessment and Plan PT Assessment and Plan Clinical Impression Statement: Pt is unable to complete any standing activity which requires him to put FWB on Rt LE for prolong time due to increased OA pain.  Pt is able to complete standing activity that has weight equally distrubuted on B LE.  Began pt on t-band postural exercise will hold on step up seconday to paiin.  Added new prone exercises  PT Plan: begin functional squats, heel raise, calf stretch     Goals   progressing  Problem List Patient Active Problem  List   Diagnosis Date Noted  . Back pain 09/23/2013  . Bilateral leg weakness 09/23/2013  . Difficulty in walking(719.7) 09/23/2013  . Olecranon fracture 09/29/2012  . Draining cutaneous sinus tract 08/24/2012  . Septic joint of left elbow 08/24/2012  . Fracture of ulna, olecranon 08/18/2012  . Elbow locking 02/18/2012  . Postoperative wound breakdown 07/02/2011  . Fibrous non-union 06/03/2011  . Bursitis of elbow 06/03/2011  . Olecranon bursitis 05/06/2011  . JOINT EFFUSION, RIGHT KNEE 06/06/2010  . LOOSE BODY-KNEE 04/09/2010  . OSTEOCHONDRITIS DESSICANS 04/09/2010  . ARTHROSCOPY, RIGHT KNEE, HX OF 04/09/2010  . TEAR MEDIAL MENISCUS 04/04/2010  . TOTAL KNEE FOLLOW-UP 09/29/2008  . OLD DISRUPTION OF POSTERIOR CRUCIATE LIGAMENT 10/14/2007  . DEGENERATIVE JOINT DISEASE, KNEE 02/26/2007  . ENTHESOPATHY, KNEE NEC 01/19/2007       GP    RUSSELL,CINDY 10/12/2013, 4:08 PM

## 2013-10-13 ENCOUNTER — Encounter (HOSPITAL_COMMUNITY): Payer: Self-pay | Admitting: Pharmacy Technician

## 2013-10-14 ENCOUNTER — Ambulatory Visit (HOSPITAL_COMMUNITY)
Admission: RE | Admit: 2013-10-14 | Discharge: 2013-10-14 | Disposition: A | Discharge status: Home / Self Care | Payer: Medicare Other | Source: Ambulatory Visit | Attending: Internal Medicine | Admitting: Internal Medicine

## 2013-10-14 DIAGNOSIS — IMO0001 Reserved for inherently not codable concepts without codable children: Secondary | ICD-10-CM | POA: Diagnosis not present

## 2013-10-14 NOTE — Progress Notes (Signed)
Physical Therapy Treatment Patient Details  Name: Ryan [name] MRN: [id number] Date of Birth: [birth year]  Today's Date: 10/14/2013 Time: 4098-11911346-1427 PT Time Calculation (min): 41 min TE [id number]  Visit#: 6 of 8  Re-eval: 10/23/13 Assessment Diagnosis: Lt hip pain  Subjective: Symptoms/Limitations Symptoms: Pt reports he is scheduled for Rt TKR on 10/27/13 and reports difficulty WB due to bone on bone.  Pain in Lt hip today, without pain in back.  Pain Assessment Currently in Pain?: Yes Pain Score: 5  Pain Location: Hip Pain Orientation: Left Pain Type: Acute pain  Exercise/Treatments Stretches Active Hamstring Stretch: 3 reps;30 seconds Active Hamstring Stretch Limitations: Supine with rope Passive Hamstring Stretch Limitations: Gastroc Stretch, Slantboard, 3x30" Prone on Elbows Stretch: 2 reps;30 seconds Press Ups: Limitations Press Ups Limitations: 10 reps Aerobic Stationary Bike: [place] [place] #2, Lv. 4 8' Standing Other Standing Lumbar Exercises: 3D hip excursion 10x Supine Bridge: 3 seconds;10 reps Straight Leg Raise: Other (comment) Straight Leg Raises Limitations: 2x10, 1# both  Physical Therapy Assessment and Plan PT Assessment and Plan Clinical Impression Statement: Pt reports complaints of pain in Lt hip prior to PT treatment, and increasing complaints of pain into Rt knee during treatment session.  Treatment transitioned to bed exercises as pt unable to tolerate further WB on Rt LE, with noted episodes of buckling in standing.  Pt able to complete bed exercises without increasing complaints of pain.  Educated pt on importance of performance of exercises in bed to increase LE flexibility and strength as pt is scheduled for knee replacement  10/27/2013.   Pt will benefit from skilled therapeutic intervention in order to improve on the following deficits: Decreased activity tolerance;Difficulty walking;Decreased strength;Pain;Impaired flexibility;Improper body  mechanics Rehab Potential: Good PT Frequency: Min 2X/week PT Duration: 4 weeks PT Treatment/Interventions: Therapeutic activities;Therapeutic exercise;Patient/family education PT Plan: Begin functional squats, heel raise  as pt is able to tolerate WB exercises.      Problem List Patient Active Problem List   Diagnosis Date Noted  . Back pain 09/23/2013  . Bilateral leg weakness 09/23/2013  . Difficulty in walking(719.7) 09/23/2013  . Olecranon fracture 09/29/2012  . Draining cutaneous sinus tract 08/24/2012  . Septic joint of left elbow 08/24/2012  . Fracture of ulna, olecranon 08/18/2012  . Elbow locking 02/18/2012  . Postoperative wound breakdown 07/02/2011  . Fibrous non-union 06/03/2011  . Bursitis of elbow 06/03/2011  . Olecranon bursitis 05/06/2011  . JOINT EFFUSION, RIGHT KNEE 06/06/2010  . LOOSE BODY-KNEE 04/09/2010  . OSTEOCHONDRITIS DESSICANS 04/09/2010  . ARTHROSCOPY, RIGHT KNEE, HX OF 04/09/2010  . TEAR MEDIAL MENISCUS 04/04/2010  . TOTAL KNEE FOLLOW-UP 09/29/2008  . OLD DISRUPTION OF POSTERIOR CRUCIATE LIGAMENT 10/14/2007  . DEGENERATIVE JOINT DISEASE, KNEE 02/26/2007  . ENTHESOPATHY, KNEE NEC 01/19/2007    PT - End of Session Activity Tolerance: Patient limited by pain General Behavior During Therapy: [facility] tasks assessed/performed   Ranyia Witting 10/14/2013, 2:32 PM

## 2013-10-18 ENCOUNTER — Telehealth: Payer: Self-pay | Admitting: Orthopedic Surgery

## 2013-10-18 NOTE — Telephone Encounter (Signed)
Regarding right total knee surgery (in-patient) scheduled at [facility] 10/27/13, CPT [phone]- per Medicare guidelines, no pre-authorization required.

## 2013-10-19 ENCOUNTER — Ambulatory Visit (HOSPITAL_COMMUNITY)
Admission: RE | Admit: 2013-10-19 | Discharge: 2013-10-19 | Disposition: A | Discharge status: Home / Self Care | Payer: Medicare Other | Source: Ambulatory Visit | Attending: Orthopedic Surgery | Admitting: Orthopedic Surgery

## 2013-10-19 DIAGNOSIS — R262 Difficulty in walking, not elsewhere classified: Secondary | ICD-10-CM

## 2013-10-19 DIAGNOSIS — IMO0001 Reserved for inherently not codable concepts without codable children: Secondary | ICD-10-CM | POA: Diagnosis not present

## 2013-10-19 DIAGNOSIS — R29898 Other symptoms and signs involving the musculoskeletal system: Secondary | ICD-10-CM

## 2013-10-19 DIAGNOSIS — M5442 Lumbago with sciatica, left side: Secondary | ICD-10-CM

## 2013-10-19 NOTE — Progress Notes (Signed)
Physical Therapy Treatment Patient Details  Name: Ryan [name] MRN: [id number] Date of Birth: November 15, 1945  Today's Date: 10/19/2013 Time: 4098-1191 PT Time Calculation (min): 38 min Charge: TE [id number]  Visit#: 7 of 8  Re-eval: 10/23/13 Assessment Diagnosis: Lt hip pain Next MD Visit: Romeo Apple 10/20/2014 Prior Therapy: for Lt TKR   Authorization: medicare  Authorization Time Period:    Authorization Visit#: 7 of 8   Subjective: Symptoms/Limitations Symptoms: Pt reports he is scheduled for Rt TKR on 10/27/13.  Pain minimal today in Lt hip Pain Assessment Currently in Pain?: Yes Pain Score: 3  Pain Location: Hip Pain Orientation: Left  Objective:  Exercise/Treatments Stretches Prone of elbow press ups 5x 10" Standing Scapular Retraction: 10 reps;Theraband Theraband Level (Scapular Retraction): Level 3 (Green) Row: 10 reps;Theraband Theraband Level (Row): Level 3 (Green) Shoulder Extension: 10 reps;Theraband Theraband Level (Shoulder Extension): Level 3 (Green)  Stretches Active Hamstring Stretch: 3 reps;30 seconds;Limitations Active Hamstring Stretch Limitations: supine with rope Quad Stretch: 3 reps;30 seconds;Limitations Quad Stretch Limitations: prone with rope Gastroc Stretch: 3 reps;30 seconds;Limitations Gastroc Stretch Limitations: standard against wall Aerobic Elliptical: 5' L1 stopped early due to Rt knee pain Standing Heel Raises: 10 reps;Limitations Heel Raises Limitations: toe raises Functional Squat: 10 reps;Limitations Functional Squat Limitations: 3D hip excursion Supine Bridges: 10 reps Straight Leg Raises: 10 reps;Limitations Straight Leg Raises Limitations: 2# Prone  Hip Extension: Left;10 reps;Limitations Hip Extension Limitations: 2#      Physical Therapy Assessment and Plan PT Assessment and Plan Clinical Impression Statement: Instructed stretches for HEP with Lt LE that pt can use for Rt knee as well following post-op for Rt TKR  next week.  Resumed standing exercises with minimal c/o Rt knee pain for functional strengtheing.  Began heel and toe raises for gastrocnemius and tibalis anterior strengtheing to improve  gait mechanics.  Lt LE strength is progressing well, able to increase weight to 2# for hip flexion and gluteal strengthening.  Pt educated on importance of posture to improve loading with weight bearing for pain relief of hip and knee., min cueing required for form and technique with theraband exercise.   PT Plan: Reassess next session.    Goals PT Short Term Goals PT Short Term Goal 1: Pt pain to be no greater than a 6/10 PT Short Term Goal 1 - Progress: Progressing toward goal PT Short Term Goal 2: Pt to have full ROM to allow pt to squat down to pick an item off the floor PT Short Term Goal 2 - Progress: Progressing toward goal PT Short Term Goal 3: Pt to be able to verbalize the importance of posture in back care. PT Short Term Goal 3 - Progress: Progressing toward goal PT Long Term Goals PT Long Term Goal 1: Pt I in advance HEP PT Long Term Goal 2: Pt pain to be no greater than a 2/10 Long Term Goal 3: Pt to state that he is able to walk for 30 minutes without increased back or Lt leg pain (may have Rt knee pain due to DJD) Long Term Goal 3 Progress: Progressing toward goal Long Term Goal 4: Pt mm strength to be increased to 5/5 Long Term Goal 4 Progress: Progressing toward goal  Problem List Patient Active Problem List   Diagnosis Date Noted  . Back pain 09/23/2013  . Bilateral leg weakness 09/23/2013  . Difficulty in walking(719.7) 09/23/2013  . Olecranon fracture 09/29/2012  . Draining cutaneous sinus tract 08/24/2012  . Septic joint of left elbow 08/24/2012  .  Fracture of ulna, olecranon 08/18/2012  . Elbow locking 02/18/2012  . Postoperative wound breakdown 07/02/2011  . Fibrous non-union 06/03/2011  . Bursitis of elbow 06/03/2011  . Olecranon bursitis 05/06/2011  . JOINT EFFUSION, RIGHT  KNEE 06/06/2010  . LOOSE BODY-KNEE 04/09/2010  . OSTEOCHONDRITIS DESSICANS 04/09/2010  . ARTHROSCOPY, RIGHT KNEE, HX OF 04/09/2010  . TEAR MEDIAL MENISCUS 04/04/2010  . TOTAL KNEE FOLLOW-UP 09/29/2008  . OLD DISRUPTION OF POSTERIOR CRUCIATE LIGAMENT 10/14/2007  . DEGENERATIVE JOINT DISEASE, KNEE 02/26/2007  . ENTHESOPATHY, KNEE NEC 01/19/2007    PT - End of Session Activity Tolerance: Patient tolerated treatment well General Behavior During Therapy: [facility] for tasks assessed/performed  GP    Juel BurrowCockerham, Casey Jo 10/19/2013, 3:30 PM

## 2013-10-20 NOTE — Patient Instructions (Signed)
Ryan [name]  10/20/2013   Your procedure is scheduled on:  10/27/13  [facility].  Call this number if you have problems the morning of surgery: [phone]   Remember:   Do not eat food or drink liquids after midnight.   Take these medicines the morning of surgery with A SIP OF WATER: Hydrochlorothiazide.   Do not wear jewelry, make-up or nail polish.  Do not wear lotions, powders, or perfumes. You may wear deodorant.  Do not shave 48 hours prior to surgery. Men may shave face and neck.  Do not bring valuables to the hospital.  [facility] is not responsible for any belongings or valuables.               Contacts, dentures or bridgework may not be worn into surgery.  Leave suitcase in the car. After surgery it may be brought to your room.  For patients admitted to the hospital, discharge time is determined by your treatment team.               Patients discharged the day of surgery will not be allowed to drive home.    Special Instructions: Shower with Hibiclens the night before surgery and the morning of surgery.   Please read over the following fact sheets that you were given: Anesthesia Post-op Instructions and Care and Recovery After Surgery    Total Knee Replacement Total knee replacement is a procedure to replace your knee joint with an artificial knee joint (prosthetic knee joint). The purpose of this surgery is to reduce pain and improve your knee function. LET [facility] PROVIDER KNOW ABOUT:   Any allergies you have.  All medicines you are taking, including vitamins, herbs, eye drops, creams, and over-the-counter medicines.  Previous problems you or members of your family have had with the use of anesthetics.  Any blood disorders you have.  Previous surgeries you have had.  Medical conditions you have. RISKS AND COMPLICATIONS  Generally, total knee replacement is a safe procedure. However, problems can occur and include:  Loss of  range of motion of the knee or instability.  Loosening of the prosthesis.  Infection.  Persistent pain. BEFORE THE PROCEDURE   Do not eat or drink anything after midnight on the night before the procedure or as directed by your health care provider.  Ask your health care provider about changing or stopping your regular medicines. This is especially important if you are taking diabetes medicines or blood thinners. PROCEDURE   Just before the procedure, you will receive medicine that will make you drowsy (sedative). This will be given through a tube that is inserted into one of your veins (IV tube).  Then you will be given one of the following:  A medicine injected into your spine that numbs your body below the waist (spinal anesthetic).  A medicine that makes you fall asleep (general anesthetic).  You may also receive medicine to block feeling in your leg (nerve block) to help ease pain after surgery.  An incision will be made in your knee. Your surgeon will take out any damaged cartilage and bone by sawing off the damaged surfaces.  The surgeon will then put a new metal liner over the sawed-off portion of your thigh bone (femur) and a plastic liner over the sawed-off portion of one of the bones of your lower leg (tibia). This is to restore alignment and function to your knee. A plastic piece is often  used to restore the surface of your knee cap. AFTER THE PROCEDURE   You will be taken to the recovery area.  You may have drainage tubes to drain excess fluid from your knee. These tubes attach to a device that removes these fluids.  Once you are awake, stable, and taking fluids well, you will be taken to your hospital room.  You will receive physical therapy as prescribed by your health care provider.  Your surgeon may recommend that you spend time (usually an additional 10-14 days) in an extended-care facility to help you begin walking again and improve your range of motion before  you go home.  You may also be prescribed blood-thinning medicine to decrease your risk of developing blood clots in your leg. Document Released: 06/17/2000 Document Revised: 07/26/2013 Document Reviewed: 04/21/2011 [facility] Patient Information 2015 [place], [place]. This information is not intended to replace advice given to you by your health care provider. Make sure you discuss any questions you have with your health care provider.    Total Knee Replacement, Care After Refer to this sheet in the next few weeks. These instructions provide you with information on caring for yourself after your procedure. Your health care provider also may give you specific instructions. Your treatment has been planned according to the most current medical practices, but problems sometimes occur. Call your health care provider if you have any problems or questions after your procedure. HOME CARE INSTRUCTIONS   See a physical therapist as directed by your health care provider.  Take medicines only as directed by your health care provider.  Avoid lifting or driving until you are instructed otherwise.  If you have been sent home with a continuous passive motion machine, use it as directed by your health care provider. SEEK MEDICAL CARE IF:  You have difficulty breathing.  You have drainage, redness, swelling, or pain at your incision site.  You have a bad smell coming from your incision site.  You have persistent bleeding from your incision site.  Your incision breaks open after sutures (stitches) or staples have been removed.  You have a fever. SEEK IMMEDIATE MEDICAL CARE IF:   You have a rash.  You have pain or swelling in your calf or thigh.  You have shortness of breath or chest pain.  Your range of motion in your knee is decreasing rather than increasing. MAKE SURE YOU:   Understand these instructions.  Will watch your condition.  Will get help right away if you are not doing well or  get worse. Document Released: 09/28/2004 Document Revised: 07/26/2013 Document Reviewed: 04/30/2011 [facility] Patient Information 2015 [place], [place]. This information is not intended to replace advice given to you by your health care provider. Make sure you discuss any questions you have with your health care provider.    PATIENT INSTRUCTIONS POST-ANESTHESIA  IMMEDIATELY FOLLOWING SURGERY:  Do not drive or operate machinery for the first twenty four hours after surgery.  Do not make any important decisions for twenty four hours after surgery or while taking narcotic pain medications or sedatives.  If you develop intractable nausea and vomiting or a severe headache please notify your doctor immediately.  FOLLOW-UP:  Please make an appointment with your surgeon as instructed. You do not need to follow up with anesthesia unless specifically instructed to do so.  WOUND CARE INSTRUCTIONS (if applicable):  Keep a dry clean dressing on the anesthesia/puncture wound site if there is drainage.  Once the wound has quit draining you may  leave it open to air.  Generally you should leave the bandage intact for twenty four hours unless there is drainage.  If the epidural site drains for more than 36-48 hours please call the anesthesia department.  QUESTIONS?:  Please feel free to call your physician or the hospital operator if you have any questions, and they will be happy to assist you.

## 2013-10-21 ENCOUNTER — Encounter (HOSPITAL_COMMUNITY)
Admission: RE | Admit: 2013-10-21 | Discharge: 2013-10-21 | Disposition: A | Discharge status: Home / Self Care | Payer: Medicare Other | Source: Ambulatory Visit | Attending: Orthopedic Surgery | Admitting: Orthopedic Surgery

## 2013-10-21 ENCOUNTER — Encounter (HOSPITAL_COMMUNITY): Payer: Self-pay

## 2013-10-21 ENCOUNTER — Other Ambulatory Visit: Payer: Self-pay

## 2013-10-21 ENCOUNTER — Ambulatory Visit (HOSPITAL_COMMUNITY)
Admission: RE | Admit: 2013-10-21 | Discharge: 2013-10-21 | Disposition: A | Discharge status: Home / Self Care | Payer: Medicare Other | Source: Ambulatory Visit | Attending: Orthopedic Surgery | Admitting: Orthopedic Surgery

## 2013-10-21 DIAGNOSIS — R262 Difficulty in walking, not elsewhere classified: Secondary | ICD-10-CM

## 2013-10-21 DIAGNOSIS — Z01810 Encounter for preprocedural cardiovascular examination: Secondary | ICD-10-CM | POA: Diagnosis not present

## 2013-10-21 DIAGNOSIS — IMO0001 Reserved for inherently not codable concepts without codable children: Secondary | ICD-10-CM | POA: Diagnosis not present

## 2013-10-21 DIAGNOSIS — R29898 Other symptoms and signs involving the musculoskeletal system: Secondary | ICD-10-CM

## 2013-10-21 DIAGNOSIS — Z01812 Encounter for preprocedural laboratory examination: Secondary | ICD-10-CM | POA: Insufficient documentation

## 2013-10-21 DIAGNOSIS — M5442 Lumbago with sciatica, left side: Secondary | ICD-10-CM

## 2013-10-21 LAB — CBC
HCT: 41.9 % (ref 39.0–52.0)
HEMOGLOBIN: 14.3 g/dL (ref 13.0–17.0)
MCH: 31.2 pg (ref 26.0–34.0)
MCHC: 34.1 g/dL (ref 30.0–36.0)
MCV: 91.3 fL (ref 78.0–100.0)
Platelets: 253 10*3/uL (ref 150–400)
RBC: 4.59 MIL/uL (ref 4.22–5.81)
RDW: 13.3 % (ref 11.5–15.5)
WBC: 10.1 10*3/uL (ref 4.0–10.5)

## 2013-10-21 LAB — BASIC METABOLIC PANEL
Anion gap: 10 (ref 5–15)
BUN: 15 mg/dL (ref 6–23)
CO2: 28 mEq/L (ref 19–32)
Calcium: 9.2 mg/dL (ref 8.4–10.5)
Chloride: 100 mEq/L (ref 96–112)
Creatinine, Ser: 1.23 mg/dL (ref 0.50–1.35)
GFR calc Af Amer: 68 mL/min — ABNORMAL LOW (ref >90)
GFR, EST NON AFRICAN AMERICAN: 59 mL/min — AB (ref >90)
GLUCOSE: 88 mg/dL (ref 70–99)
POTASSIUM: 4.3 meq/L (ref 3.7–5.3)
Sodium: 138 mEq/L (ref 137–147)

## 2013-10-21 LAB — SURGICAL PCR SCREEN
MRSA, PCR: POSITIVE — AB
STAPHYLOCOCCUS AUREUS: POSITIVE — AB

## 2013-10-21 LAB — PREPARE RBC (CROSSMATCH)
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
## 2013-10-26 LAB — ABO/RH: ABO/RH(D): A POS

## 2013-10-26 MED ORDER — TRANEXAMIC ACID 100 MG/ML IV SOLN
1000.0000 mg | INTRAVENOUS | Status: AC
  Administered 2013-10-27: 1000 mg via INTRAVENOUS
  Filled 2013-10-26: qty 10

## 2013-10-27 ENCOUNTER — Ambulatory Visit: Payer: Self-pay | Admitting: Orthopedic Surgery

## 2013-10-27 ENCOUNTER — Encounter (HOSPITAL_COMMUNITY)
Admission: RE | Disposition: A | Discharge status: Home Health | Payer: Self-pay | Source: Ambulatory Visit | Attending: Orthopedic Surgery

## 2013-10-27 ENCOUNTER — Inpatient Hospital Stay (HOSPITAL_COMMUNITY)
Admission: RE | Admit: 2013-10-27 | Discharge: 2013-10-30 | DRG: 470 | Disposition: A | Discharge status: Home Health | Payer: Medicare Other | Source: Ambulatory Visit | Attending: Orthopedic Surgery | Admitting: Orthopedic Surgery

## 2013-10-27 ENCOUNTER — Inpatient Hospital Stay (HOSPITAL_COMMUNITY): Payer: Medicare Other

## 2013-10-27 ENCOUNTER — Encounter (HOSPITAL_COMMUNITY): Payer: Self-pay | Admitting: *Deleted

## 2013-10-27 ENCOUNTER — Encounter (HOSPITAL_COMMUNITY): Payer: Medicare Other | Admitting: Anesthesiology

## 2013-10-27 ENCOUNTER — Inpatient Hospital Stay (HOSPITAL_COMMUNITY): Payer: Medicare Other | Admitting: Anesthesiology

## 2013-10-27 DIAGNOSIS — I10 Essential (primary) hypertension: Secondary | ICD-10-CM | POA: Diagnosis present

## 2013-10-27 DIAGNOSIS — M1710 Unilateral primary osteoarthritis, unspecified knee: Secondary | ICD-10-CM | POA: Diagnosis present

## 2013-10-27 DIAGNOSIS — M179 Osteoarthritis of knee, unspecified: Secondary | ICD-10-CM | POA: Diagnosis present

## 2013-10-27 DIAGNOSIS — M1711 Unilateral primary osteoarthritis, right knee: Secondary | ICD-10-CM

## 2013-10-27 DIAGNOSIS — M25569 Pain in unspecified knee: Secondary | ICD-10-CM | POA: Diagnosis present

## 2013-10-27 DIAGNOSIS — F17200 Nicotine dependence, unspecified, uncomplicated: Secondary | ICD-10-CM | POA: Diagnosis present

## 2013-10-27 HISTORY — PX: TOTAL KNEE ARTHROPLASTY: SHX125

## 2013-10-27 SURGERY — ARTHROPLASTY, KNEE, TOTAL
Anesthesia: Monitor Anesthesia Care | Site: Knee | Laterality: Right

## 2013-10-27 MED ORDER — ALUM & MAG HYDROXIDE-SIMETH 200-200-20 MG/5ML PO SUSP: 30.0000 mL | ORAL | Status: DC | PRN

## 2013-10-27 MED ORDER — PREGABALIN 50 MG PO CAPS
ORAL_CAPSULE | ORAL | Status: AC
  Filled 2013-10-27: qty 1

## 2013-10-27 MED ORDER — DOCUSATE SODIUM 100 MG PO CAPS
100.0000 mg | ORAL_CAPSULE | Freq: Two times a day (BID) | ORAL | Status: DC
  Administered 2013-10-27 – 2013-10-30 (×7): 100 mg via ORAL
  Filled 2013-10-27 (×7): qty 1

## 2013-10-27 MED ORDER — ONDANSETRON HCL 4 MG/2ML IJ SOLN
4.0000 mg | Freq: Once | INTRAMUSCULAR | Status: AC
  Administered 2013-10-27: 4 mg via INTRAVENOUS

## 2013-10-27 MED ORDER — METHOCARBAMOL 1000 MG/10ML IJ SOLN
500.0000 mg | Freq: Once | INTRAVENOUS | Status: DC
  Administered 2013-10-27: 500 mg via INTRAVENOUS
  Filled 2013-10-27: qty 5

## 2013-10-27 MED ORDER — BUPIVACAINE-EPINEPHRINE (PF) 0.5% -1:200000 IJ SOLN
INTRAMUSCULAR | Status: AC
  Filled 2013-10-27: qty 30

## 2013-10-27 MED ORDER — PHENOL 1.4 % MT LIQD: 1.0000 | OROMUCOSAL | Status: DC | PRN

## 2013-10-27 MED ORDER — ASPIRIN EC 325 MG PO TBEC
325.0000 mg | DELAYED_RELEASE_TABLET | Freq: Two times a day (BID) | ORAL | Status: DC
  Administered 2013-10-27 – 2013-10-30 (×7): 325 mg via ORAL
  Filled 2013-10-27 (×7): qty 1

## 2013-10-27 MED ORDER — PROPOFOL INFUSION 10 MG/ML OPTIME
INTRAVENOUS | Status: DC | PRN
  Administered 2013-10-27: 35 ug/kg/min via INTRAVENOUS
  Administered 2013-10-27: 09:00:00 via INTRAVENOUS

## 2013-10-27 MED ORDER — CHLORHEXIDINE GLUCONATE 4 % EX LIQD: 60.0000 mL | Freq: Once | CUTANEOUS | Status: DC

## 2013-10-27 MED ORDER — METHOCARBAMOL 500 MG PO TABS
500.0000 mg | ORAL_TABLET | Freq: Four times a day (QID) | ORAL | Status: DC | PRN
  Administered 2013-10-29: 500 mg via ORAL
  Filled 2013-10-27 (×2): qty 1

## 2013-10-27 MED ORDER — SENNA 8.6 MG PO TABS
1.0000 | ORAL_TABLET | Freq: Two times a day (BID) | ORAL | Status: DC
Start: 2013-10-27 — End: 2013-10-30
  Administered 2013-10-27 – 2013-10-30 (×7): 8.6 mg via ORAL
  Filled 2013-10-27 (×6): qty 1

## 2013-10-27 MED ORDER — BUPIVACAINE LIPOSOME 1.3 % IJ SUSP
INTRAMUSCULAR | Status: DC | PRN
  Administered 2013-10-27: 09:00:00

## 2013-10-27 MED ORDER — SODIUM CHLORIDE 0.9 % IJ SOLN
INTRAMUSCULAR | Status: AC
  Filled 2013-10-27: qty 40

## 2013-10-27 MED ORDER — FENTANYL CITRATE 0.05 MG/ML IJ SOLN: 25.0000 ug | INTRAMUSCULAR | Status: DC | PRN

## 2013-10-27 MED ORDER — ACETAMINOPHEN 650 MG RE SUPP: 650.0000 mg | Freq: Four times a day (QID) | RECTAL | Status: DC | PRN

## 2013-10-27 MED ORDER — CEFAZOLIN SODIUM-DEXTROSE 2-3 GM-% IV SOLR
2.0000 g | INTRAVENOUS | Status: AC
  Administered 2013-10-27: 2 g via INTRAVENOUS
  Filled 2013-10-27: qty 50

## 2013-10-27 MED ORDER — FENTANYL CITRATE 0.05 MG/ML IJ SOLN
25.0000 ug | INTRAMUSCULAR | Status: DC
  Administered 2013-10-27: 25 ug via INTRAVENOUS
  Filled 2013-10-27: qty 2

## 2013-10-27 MED ORDER — FLEET ENEMA 7-19 GM/118ML RE ENEM: 1.0000 | ENEMA | Freq: Once | RECTAL | Status: AC | PRN

## 2013-10-27 MED ORDER — MIDAZOLAM HCL 2 MG/2ML IJ SOLN
INTRAMUSCULAR | Status: AC
  Filled 2013-10-27: qty 2

## 2013-10-27 MED ORDER — 0.9 % SODIUM CHLORIDE (POUR BTL) OPTIME
TOPICAL | Status: DC | PRN
  Administered 2013-10-27: 1000 mL

## 2013-10-27 MED ORDER — TRAMADOL HCL 50 MG PO TABS
50.0000 mg | ORAL_TABLET | Freq: Once | ORAL | Status: AC
  Administered 2013-10-27: 50 mg via ORAL

## 2013-10-27 MED ORDER — CEFAZOLIN SODIUM-DEXTROSE 2-3 GM-% IV SOLR
2.0000 g | Freq: Four times a day (QID) | INTRAVENOUS | Status: AC
  Administered 2013-10-27 (×2): 2 g via INTRAVENOUS
  Filled 2013-10-27 (×2): qty 50

## 2013-10-27 MED ORDER — ONDANSETRON HCL 4 MG/2ML IJ SOLN: 4.0000 mg | Freq: Once | INTRAMUSCULAR | Status: DC | PRN

## 2013-10-27 MED ORDER — SENNOSIDES-DOCUSATE SODIUM 8.6-50 MG PO TABS
1.0000 | ORAL_TABLET | Freq: Every evening | ORAL | Status: DC | PRN
  Administered 2013-10-29: 1 via ORAL
  Filled 2013-10-27: qty 1

## 2013-10-27 MED ORDER — MIDAZOLAM HCL 2 MG/2ML IJ SOLN
1.0000 mg | INTRAMUSCULAR | Status: DC | PRN
  Administered 2013-10-27: 2 mg via INTRAVENOUS
  Filled 2013-10-27: qty 2

## 2013-10-27 MED ORDER — BISACODYL 5 MG PO TBEC: 5.0000 mg | DELAYED_RELEASE_TABLET | Freq: Every day | ORAL | Status: DC | PRN

## 2013-10-27 MED ORDER — BUPIVACAINE IN DEXTROSE 0.75-8.25 % IT SOLN
INTRATHECAL | Status: DC | PRN
  Administered 2013-10-27: 15 mg via INTRATHECAL

## 2013-10-27 MED ORDER — LACTATED RINGERS IV SOLN
INTRAVENOUS | Status: DC
  Administered 2013-10-27 (×2): via INTRAVENOUS

## 2013-10-27 MED ORDER — HYDROCODONE-ACETAMINOPHEN 7.5-325 MG PO TABS
1.0000 | ORAL_TABLET | ORAL | Status: DC
  Administered 2013-10-27 – 2013-10-28 (×6): 1 via ORAL
  Filled 2013-10-27 (×6): qty 1

## 2013-10-27 MED ORDER — ONDANSETRON HCL 4 MG PO TABS: 4.0000 mg | ORAL_TABLET | Freq: Four times a day (QID) | ORAL | Status: DC | PRN

## 2013-10-27 MED ORDER — ACETAMINOPHEN 325 MG PO TABS: 650.0000 mg | ORAL_TABLET | Freq: Four times a day (QID) | ORAL | Status: DC | PRN

## 2013-10-27 MED ORDER — EPHEDRINE SULFATE 50 MG/ML IJ SOLN
INTRAMUSCULAR | Status: DC | PRN
  Administered 2013-10-27 (×2): 10 mg via INTRAVENOUS

## 2013-10-27 MED ORDER — DIPHENHYDRAMINE HCL 12.5 MG/5ML PO ELIX: 12.5000 mg | ORAL_SOLUTION | ORAL | Status: DC | PRN

## 2013-10-27 MED ORDER — PROPOFOL 10 MG/ML IV BOLUS
INTRAVENOUS | Status: AC
  Filled 2013-10-27: qty 20

## 2013-10-27 MED ORDER — FENTANYL CITRATE 0.05 MG/ML IJ SOLN
INTRAMUSCULAR | Status: DC | PRN
  Administered 2013-10-27: 25 ug via INTRAVENOUS

## 2013-10-27 MED ORDER — FENTANYL CITRATE 0.05 MG/ML IJ SOLN
INTRAMUSCULAR | Status: AC
  Filled 2013-10-27: qty 2

## 2013-10-27 MED ORDER — SODIUM CHLORIDE 0.9 % IR SOLN
Status: DC | PRN
  Administered 2013-10-27: 3000 mL

## 2013-10-27 MED ORDER — ONDANSETRON HCL 4 MG/2ML IJ SOLN
4.0000 mg | Freq: Four times a day (QID) | INTRAMUSCULAR | Status: DC | PRN
  Administered 2013-10-28: 4 mg via INTRAVENOUS
  Filled 2013-10-27: qty 2

## 2013-10-27 MED ORDER — METOCLOPRAMIDE HCL 5 MG/ML IJ SOLN: 5.0000 mg | Freq: Three times a day (TID) | INTRAMUSCULAR | Status: DC | PRN

## 2013-10-27 MED ORDER — BUPIVACAINE IN DEXTROSE 0.75-8.25 % IT SOLN
INTRATHECAL | Status: AC
  Filled 2013-10-27: qty 2

## 2013-10-27 MED ORDER — METHOCARBAMOL 1000 MG/10ML IJ SOLN
500.0000 mg | Freq: Four times a day (QID) | INTRAVENOUS | Status: DC | PRN
  Filled 2013-10-27: qty 5

## 2013-10-27 MED ORDER — PREGABALIN 50 MG PO CAPS
50.0000 mg | ORAL_CAPSULE | Freq: Once | ORAL | Status: AC
  Administered 2013-10-27: 50 mg via ORAL

## 2013-10-27 MED ORDER — HYDROMORPHONE HCL PF 1 MG/ML IJ SOLN
0.5000 mg | INTRAMUSCULAR | Status: DC | PRN
  Administered 2013-10-27 – 2013-10-28 (×3): 0.5 mg via INTRAVENOUS
  Filled 2013-10-27 (×3): qty 1

## 2013-10-27 MED ORDER — FENTANYL CITRATE 0.05 MG/ML IJ SOLN
INTRAMUSCULAR | Status: DC | PRN
  Administered 2013-10-27: 25 ug via INTRATHECAL

## 2013-10-27 MED ORDER — METOCLOPRAMIDE HCL 10 MG PO TABS
5.0000 mg | ORAL_TABLET | Freq: Three times a day (TID) | ORAL | Status: DC | PRN
  Filled 2013-10-27: qty 2

## 2013-10-27 MED ORDER — MENTHOL 3 MG MT LOZG: 1.0000 | LOZENGE | OROMUCOSAL | Status: DC | PRN

## 2013-10-27 MED ORDER — POTASSIUM CHLORIDE IN NACL 20-0.9 MEQ/L-% IV SOLN
INTRAVENOUS | Status: DC
  Administered 2013-10-27 – 2013-10-28 (×2): via INTRAVENOUS

## 2013-10-27 MED ORDER — HYDROCHLOROTHIAZIDE 12.5 MG PO CAPS
12.5000 mg | ORAL_CAPSULE | Freq: Every day | ORAL | Status: DC
  Administered 2013-10-27 – 2013-10-30 (×4): 12.5 mg via ORAL
  Filled 2013-10-27 (×4): qty 1

## 2013-10-27 MED ORDER — MIDAZOLAM HCL 5 MG/5ML IJ SOLN
INTRAMUSCULAR | Status: DC | PRN
  Administered 2013-10-27 (×2): 1 mg via INTRAVENOUS

## 2013-10-27 MED ORDER — BUPIVACAINE LIPOSOME 1.3 % IJ SUSP
INTRAMUSCULAR | Status: AC
  Filled 2013-10-27: qty 20

## 2013-10-27 MED ORDER — ONDANSETRON HCL 4 MG/2ML IJ SOLN
INTRAMUSCULAR | Status: AC
  Filled 2013-10-27: qty 2

## 2013-10-27 MED ORDER — BUPIVACAINE-EPINEPHRINE (PF) 0.5% -1:200000 IJ SOLN
INTRAMUSCULAR | Status: DC | PRN
  Administered 2013-10-27: 30 mL

## 2013-10-27 SURGICAL SUPPLY — 76 items
BAG HAMPER (MISCELLANEOUS) ×3 IMPLANT
BANDAGE ESMARK 6X9 LF (GAUZE/BANDAGES/DRESSINGS) ×1 IMPLANT
BIT DRILL 3.2X128 (BIT) ×1 IMPLANT
BIT DRILL 3.2X128MM (BIT) ×1
BLADE HEX COATED 2.75 (ELECTRODE) ×3 IMPLANT
BLADE SAG 18X100X1.27 (BLADE) ×2 IMPLANT
BLADE SAGITTAL 25.0X1.27X90 (BLADE) ×1 IMPLANT
BLADE SAGITTAL 25.0X1.27X90MM (BLADE)
BLADE SAW SAG 90X13X1.27 (BLADE) ×3 IMPLANT
BNDG CMPR 9X6 STRL LF SNTH (GAUZE/BANDAGES/DRESSINGS) ×1
BNDG ESMARK 6X9 LF (GAUZE/BANDAGES/DRESSINGS) ×3
BOWL SMART MIX CTS (DISPOSABLE) ×2 IMPLANT
CAPT FB KNEE W/COCR XLINK ×2 IMPLANT
CEMENT HV SMART SET (Cement) ×6 IMPLANT
CLOTH BEACON ORANGE TIMEOUT ST (SAFETY) ×3 IMPLANT
COVER LIGHT HANDLE STERIS (MISCELLANEOUS) ×12 IMPLANT
COVER PROBE W GEL 5X96 (DRAPES) ×3 IMPLANT
CUFF TOURNIQUET SINGLE 34IN LL (TOURNIQUET CUFF) ×2 IMPLANT
CUFF TOURNIQUET SINGLE 44IN (TOURNIQUET CUFF) IMPLANT
DECANTER SPIKE VIAL GLASS SM (MISCELLANEOUS) ×6 IMPLANT
DRAPE BACK TABLE (DRAPES) ×3 IMPLANT
DRAPE EXTREMITY T 121X128X90 (DRAPE) ×3 IMPLANT
DRSG MEPILEX BORDER 4X12 (GAUZE/BANDAGES/DRESSINGS) ×3 IMPLANT
DURAPREP 26ML APPLICATOR (WOUND CARE) ×6 IMPLANT
ELECT REM PT RETURN 9FT ADLT (ELECTROSURGICAL) ×3
ELECTRODE REM PT RTRN 9FT ADLT (ELECTROSURGICAL) ×1 IMPLANT
EVACUATOR 3/16  PVC DRAIN (DRAIN) ×2
EVACUATOR 3/16 PVC DRAIN (DRAIN) ×1 IMPLANT
FACESHIELD LNG OPTICON STERILE (SAFETY) ×2 IMPLANT
GLOVE BIOGEL M 7.0 STRL (GLOVE) ×2 IMPLANT
GLOVE BIOGEL PI IND STRL 7.0 (GLOVE) IMPLANT
GLOVE BIOGEL PI INDICATOR 7.0 (GLOVE) ×4
GLOVE ECLIPSE 6.5 STRL STRAW (GLOVE) ×2 IMPLANT
GLOVE EXAM NITRILE LRG STRL (GLOVE) ×4 IMPLANT
GLOVE EXAM NITRILE MD LF STRL (GLOVE) ×4 IMPLANT
GLOVE OPTIFIT SS 8.0 STRL (GLOVE) ×3 IMPLANT
GLOVE SKINSENSE NS SZ8.0 LF (GLOVE) ×4
GLOVE SKINSENSE STRL SZ8.0 LF (GLOVE) ×2 IMPLANT
GLOVE SS BIOGEL STRL SZ 8 (GLOVE) IMPLANT
GLOVE SS N UNI LF 8.5 STRL (GLOVE) ×3 IMPLANT
GLOVE SUPERSENSE BIOGEL SZ 8 (GLOVE) ×2
GOWN STRL REUS W/TWL LRG LVL3 (GOWN DISPOSABLE) ×10 IMPLANT
GOWN STRL REUS W/TWL XL LVL3 (GOWN DISPOSABLE) ×3 IMPLANT
HANDPIECE INTERPULSE COAX TIP (DISPOSABLE) ×3
HOOD W/PEELAWAY (MISCELLANEOUS) ×12 IMPLANT
INST SET MAJOR BONE (KITS) ×3 IMPLANT
IV NS IRRIG 3000ML ARTHROMATIC (IV SOLUTION) ×3 IMPLANT
KIT BLADEGUARD II DBL (SET/KITS/TRAYS/PACK) ×3 IMPLANT
KIT ROOM TURNOVER APOR (KITS) ×3 IMPLANT
MANIFOLD NEPTUNE II (INSTRUMENTS) ×3 IMPLANT
MARKER SKIN DUAL TIP RULER LAB (MISCELLANEOUS) ×3 IMPLANT
NDL HYPO 21X1.5 SAFETY (NEEDLE) ×1 IMPLANT
NDL HYPO 25X1 1.5 SAFETY (NEEDLE) ×1 IMPLANT
NEEDLE HYPO 21X1.5 SAFETY (NEEDLE) ×3 IMPLANT
NEEDLE HYPO 25X1 1.5 SAFETY (NEEDLE) ×3 IMPLANT
NS IRRIG 1000ML POUR BTL (IV SOLUTION) ×3 IMPLANT
PACK TOTAL JOINT (CUSTOM PROCEDURE TRAY) ×3 IMPLANT
PAD ARMBOARD 7.5X6 YLW CONV (MISCELLANEOUS) ×3 IMPLANT
PAD DANNIFLEX CPM (ORTHOPEDIC SUPPLIES) ×3 IMPLANT
PIN TROCAR 3 INCH (PIN) ×3 IMPLANT
SAW OSC TIP CART 19.5X105X1.3 (SAW) ×2 IMPLANT
SET BASIN LINEN APH (SET/KITS/TRAYS/PACK) ×3 IMPLANT
SET HNDPC FAN SPRY TIP SCT (DISPOSABLE) ×1 IMPLANT
SPONGE DRAIN TRACH 4X4 STRL 2S (GAUZE/BANDAGES/DRESSINGS) ×3 IMPLANT
STAPLER VISISTAT 35W (STAPLE) ×3 IMPLANT
SUT BRALON NAB BRD #1 30IN (SUTURE) ×3 IMPLANT
SUT MON AB 0 CT1 (SUTURE) ×5 IMPLANT
SUT MON AB 2-0 CT1 36 (SUTURE) ×2 IMPLANT
SYR 20CC LL (SYRINGE) ×4 IMPLANT
SYR 30ML LL (SYRINGE) ×5 IMPLANT
SYR BULB IRRIGATION 50ML (SYRINGE) ×3 IMPLANT
TOWEL OR 17X26 4PK STRL BLUE (TOWEL DISPOSABLE) ×3 IMPLANT
TOWER CARTRIDGE SMART MIX (DISPOSABLE) ×3 IMPLANT
TRAY FOLEY CATH 16FR SILVER (SET/KITS/TRAYS/PACK) ×3 IMPLANT
WATER STERILE IRR 1000ML POUR (IV SOLUTION) ×8 IMPLANT
YANKAUER SUCT 12FT TUBE ARGYLE (SUCTIONS) ×3 IMPLANT

## 2013-10-27 NOTE — Brief Op Note (Signed)
10/27/2013  9:42 AM  PATIENT:  Ryan [name]  68 y.o. male  PRE-OPERATIVE DIAGNOSIS:  right knee osteoarthritis  POST-OPERATIVE DIAGNOSIS:  right knee osteoarthritis  PROCEDURE:  Procedure(s): TOTAL KNEE ARTHROPLASTY (Right) DEPUY 65F 4 T 38 P AND A 10 PS INSERT   SURGEON:  Surgeon(s) and Role:    * Vickki HearingStanley E Rhaelyn Giron, MD - Primary  PHYSICIAN ASSISTANT:   ASSISTANTS: Valetta Closeebbie Dallas and Wayne McFaTTER  ANESTHESIA:   spinal  EBL:  Total I/O In: 1400 [I.V.:1400] Out: -   BLOOD ADMINISTERED:none  DRAINS: One Hemovac drain in the joint stable to the skin not activated   LOCAL MEDICATIONS USED:  MARCAINE   , Amount: 30 ml and OTHER 40 of exPAREL  SPECIMEN:  No Specimen  DISPOSITION OF SPECIMEN:  N/A  COUNTS:  YES  TOURNIQUET:   Total Tourniquet Time Documented: Thigh (Right) - 80 minutes Total: Thigh (Right) - 80 minutes   DICTATION: .Reubin Milanragon Dictation  PLAN OF CARE: Admit to inpatient   PATIENT DISPOSITION:  PACU - hemodynamically stable.   Delay start of Pharmacological VTE agent (>24hrs) due to surgical blood loss or risk of bleeding: yes

## 2013-10-27 NOTE — Anesthesia Preprocedure Evaluation (Signed)
Anesthesia Evaluation  Patient identified by MRN, date of birth, ID band Patient awake    Reviewed: Allergy & Precautions, H&P , NPO status , Patient's Chart, lab work & pertinent test results  Airway Mallampati: II TM Distance: >3 FB Neck ROM: Full    Dental  (+) Edentulous Upper   Pulmonary Current Smoker,    Pulmonary exam normal       Cardiovascular hypertension, Pt. on medications negative cardio ROS  Rhythm:Regular Rate:Normal     Neuro/Psych negative neurological ROS  negative psych ROS   GI/Hepatic negative GI ROS, Neg liver ROS,   Endo/Other  negative endocrine ROS  Renal/GU negative Renal ROS  negative genitourinary   Musculoskeletal   Abdominal Normal abdominal exam  (+)   Peds  Hematology negative hematology ROS (+)   Anesthesia Other Findings   Reproductive/Obstetrics                           Anesthesia Physical Anesthesia Plan  ASA: II  Anesthesia Plan: Spinal and MAC   Post-op Pain Management:    Induction: Intravenous  Airway Management Planned: Nasal Cannula  Additional Equipment:   Intra-op Plan:   Post-operative Plan:   Informed Consent: I have reviewed the patients History and Physical, chart, labs and discussed the procedure including the risks, benefits and alternatives for the proposed anesthesia with the patient or authorized representative who has indicated his/her understanding and acceptance.     Plan Discussed with:   Anesthesia Plan Comments:         Anesthesia Quick Evaluation

## 2013-10-27 NOTE — Op Note (Signed)
Date of surgery 10/27/2013  Operating surgeon Dr. Romeo AppleHarrison assisted by [facility] Center[facility]ne McFatter and [facility] Dallas  Spinal anesthetic   Preop diagnosis osteoarthritis right  knee Postop diagnosis same Procedure right  total knee arthroplasty Surgeon Romeo AppleHarrison Anesthesia spinal Findings varus knee with medial compartment gonarthrosis and osteophytes mild patellofemoral arthritis. Lateral compartment intact with intact meniscus. Tourniquet time 79minutes, pressure 300 mm of mercury  Indications for procedure disabling knee pain, failure to control pain with nonoperative measures  Details of procedure:  In the preop area the patient's right knee was marked and countersigned by the surgeon, the chart was updated, consent was signed  The patient was taken to the operating room for spinal anesthetic followed by administering 2 g of Ancef based on weight of >80 kg  A Foley catheter was inserted sterilely, then the operative extremity (right knee) was prepped and draped sterilely  The timeout was completed  The limb was then exsanguinated with a six-inch Esmarch with the knee in flexion and the tourniquet was elevated to 300 mm of mercury. A midline incision was made, the subcutaneous tissues were divided down to the extensor mechanism. A medial arthrotomy was performed, the patella was everted,  the fat pad was resected. The medial and  lateral menisci were resected. The medial soft tissue sleeve was elevated to the mid coronal plane. The anterior cruciate ligament and PCL were resected. Osteophytes were removed. The distal femur anterior surface was skeletonized with sharp dissection.  A three-eighths inch drill bit was used to enter the femoral canal which was suctioned and irrigated until the fluid was clear;  an intramedullary rod was placed in the femur with a 5 , right 11 mm setting, the block was pinned in place; then a 10mm distal femoral resection was performed. The cut was checked for  flatness.  The sizing guide was then placed on the femur, the femur measured a size 3; the block was pinned in external rotation 3 using the epicondyles as reference; a  4-in-1 cutting block was placed and the 4 cuts were made with retractors protecting the collateral ligaments. The posterior osteophytes were removed with a curved osteotome. Residual PCL tissue was resected; residual meniscal tissue was resected.  The external tibial alignment instrument was set for tibial resection. The guide was   placed referencing the medial side which was the worn side and the stylus was set at 2 mm resection. Anterior slope was built in to match the patients anatomy; a neutral varus valgus cut was set using the medial third of the tibial tubercle as reference along with the medial portion of the lateral tibial spine. The guide was stabilized with pins.  A saw was used to resect the anterior tibia. The tibia was sized to a size 4 base plate.  We then balanced the knee using spacer blocks and laminar spreaders. The knee was stable and balanced in flexion and extension  with a 10 spacer.   The size 3 box cutting guide was placed to resect the bone for the post. We then turned our attention to the patella.  The patella measured 22 mm in thickness; we set the guide to leave 14mm of patella.  After resection of the patella,  It was remeasured, and measured 14 mm. I sized the patella to a  38/639mm. We then drilled the 3 peg holes.   We then did a trial reduction. The trial reduction confirmed that the knee  balanced in extension and balance in flexion. Passive flexion  equalled 120, and patella tracking was normal.  The tibial rotation was set avoiding internal rotation, allowing congruency with the femur.  We then punched the tibia per technique with a 4 base plate.   The bone was then irrigated and dried while cement was mixed on the back table. The implants were checked for accuracy and then cemented in place; excess  cement was removed; the cement was allowed to cure. The wound was then irrigated with copious amounts of saline, the posterior capsule was injected with experel this was extended to include the soft tissues surrounding the knee joint. The range of motion and stability matched trial reduction  The capsule was closed with #1 Bralon in interrupted and running fashion and then the joint was injected with 30 cc of Marcaine with epinephrine.  The subcutaneous tissues were closed with  0 Monocryl  in running fashion  Staples were used to reapproximate the skin edges.  Sterile dressings were applied.   The patient was then taken to the recovery room in stable condition.  Routine postop plan for knee replacement.

## 2013-10-27 NOTE — Anesthesia Procedure Notes (Signed)
Spinal  Patient location during procedure: OR Start time: 10/27/2013 7:44 AM Staffing CRNA/Resident: Zaiden Ludlum J Preanesthetic Checklist Completed: patient identified, site marked, surgical consent, pre-op evaluation, timeout performed, IV checked, risks and benefits discussed and monitors and equipment checked Spinal Block Patient position: right lateral decubitus Prep: Betadine Patient monitoring: heart rate, cardiac monitor, continuous pulse ox and blood pressure Approach: midline Location: L2-3 Injection technique: single-shot Needle Needle type: Spinocan  Needle gauge: 22 G Assessment Sensory level: T8 Additional Notes  CSF brisk and clear       #[id number]    11/2014

## 2013-10-27 NOTE — Evaluation (Addendum)
Physical Therapy Evaluation Patient Details Name: Ryan [name] MRN: [id number] DOB: [birth year] Today's Date: 10/27/2013   History of Present Illness  Ryan [name], 68 y.o. male, has a history of pain and functional disability in the right knee due to arthritis and has failed non-surgical conservative treatments for greater than 12 weeks to includeNSAID's and/or analgesics, corticosteriod injections, flexibility and strengthening excercises, supervised PT with diminished ADL's post treatment, use of assistive devices and activity modification.  Onset of symptoms was gradual, starting 5 years ago with gradually worsening course since that time. The patient noted no past surgery on the right knee(s).  Patient currently rates pain in the right knee(s) at 4 out of 10 with activity. Patient has worsening of pain with activity and weight bearing, pain that interferes with activities of daily living, pain with passive range of motion, crepitus and joint swelling.  Patient has evidence of subchondral sclerosis and joint space narrowing by imaging studies. This patient has had no previous osteotomy. There is no active infection.  Pt underwent Rt TKA on 10/27/13 secondary to Rt knee OA.   Clinical Impression  Pt is a 68 year old male who presents to physical therapy after Rt TKA this morning on 10/27/13 secondary to Rt knee OA.  Prior to this, pt was receiving OOPT services secondary to hip pain, however OOPT discharged at this time as pt was to undergo TKA.  Per family, pt just received pain medication prior to PT entering room and pt extremely lethargic and breaking out in cold sweats during evaluation.  History received from wife secondary to lethargy and pt falling asleep during questioning.  Pt lives in a two story home with bedroom upstairs, though family reports patient will be sleeping on the couch until he is able to ascend/descend stairs.  Pt has a RW, quad cane, and BSC at home from previous knee  replacement surgery to the Lt knee. During evaluation, pt able to complete supine exercises with VC to arouse pt to actively participate in exercises.  All functional mobility deferred at this time secondary to lethargy.   Created HEP for pt for ankle pumps, heel slides, quad sets, and hip ABD for pt/family with instructions to family to attempt to have pt complete exercises one time every hour as tolerated if awake.   Recommend continued PT while in the hospital to address functional mobility skills, and therapeutic exercises for ROM and strengthening of LE.  Anticipate transition to HHPT, though further assessment of functional mobility skills will be needed to ensure no placement is needed prior to returning home.  No DME recommendations as pt has personal equipment.     Follow Up Recommendations Home health PT (Anticipate discharge to HHPT, though further assessment will continue for functional mobility skills to ensure no placement is needed for safe return home)    Equipment Recommendations  None recommended by PT       Precautions / Restrictions Precautions Precautions: Fall Restrictions Weight Bearing Restrictions: Yes RLE Weight Bearing: Weight bearing as tolerated      Mobility  Bed Mobility               General bed mobility comments: Unable to assess functional mobility skills during evaluation secondary to lethargy.         Balance Overall balance assessment: Needs assistance (Unable to formally assess secondary to lethargy. )  Pertinent Vitals/Pain Pain 5/10 in Rt knee with heel slide exercise.     Home Living Family/patient expects to be discharged to:: Private residence Living Arrangements: Spouse/significant other Available Help at Discharge: Family Type of Home: House Home Access: Stairs to enter Entrance Stairs-Rails: None Entrance Stairs-Number of Steps: 1 Home Layout: Bed/bath upstairs (Pt  will be sleeping on couch downstairs until able to climb stairs) Home Equipment: Toilet riser, shower seat, RW, BSC, quad cane Additional Comments: Tub shower    Prior Function Level of Independence: Independent                  Extremity/Trunk Assessment               Lower Extremity Assessment: Generalized weakness;RLE deficits/detail RLE Deficits / Details: Knee flexion AROM 30 degrees.         Communication   Communication: No difficulties  Cognition Arousal/Alertness: Lethargic;Suspect due to medications (Pt was recently given pain medication) Behavior During Therapy: [facility] LtdWFL for tasks assessed/performed Overall Cognitive Status: Within Functional Limits for tasks assessed                         Exercises Total Joint Exercises Ankle Circles/Pumps: AROM;Both;10 reps;Supine Quad Sets: Strengthening;Both;10 reps;Supine Heel Slides: AROM;AAROM;Both;10 reps;Supine (AROM Lt, AAROM Rt) Hip ABduction/ADduction: AROM;AAROM;Both;10 reps;Supine (AROM Lt, AAROM Rt)      Assessment/Plan    PT Assessment Patient needs continued PT services  PT Diagnosis Difficulty walking;Generalized weakness   PT Problem List Decreased strength;Decreased range of motion;Decreased activity tolerance;Decreased balance;Decreased mobility;Pain  PT Treatment Interventions DME instruction;Balance training;Gait training;Neuromuscular re-education;Modalities;Functional mobility training;Therapeutic activities;Therapeutic exercise;Patient/family education;Stair training;Manual techniques   PT Goals (Current goals can be found in the Care Plan section) Acute Rehab PT Goals Patient Stated Goal: go home PT Goal Formulation: With patient Time For Goal Achievement: 11/10/13 Potential to Achieve Goals: Good    Frequency 7X/week    End of Session   Activity Tolerance: Patient tolerated treatment well Patient left: in bed;with call bell/phone within reach;with bed alarm set;with  family/visitor present (Gave HEP, with instructions to family to complete every hour x10 reps)           Time: [id number] PT Time Calculation (min): 34 min   Charges:   PT Evaluation $Initial PT Evaluation Tier I: 1 Procedure PT Treatments $Self Care/Home Management: 8-22 (Created HEP and educated family on techniques/importance of completion of HEP)    Tyrik Stetzer 10/27/2013, 3:49 PM  ADDENDUM : added shower seat to home equipment

## 2013-10-27 NOTE — Anesthesia Postprocedure Evaluation (Signed)
  Anesthesia Post-op Note  Patient: Ryan [name]  Procedure(s) Performed: Procedure(s): TOTAL KNEE ARTHROPLASTY (Right)  Patient Location: PACU  Anesthesia Type:Spinal  Level of Consciousness: awake, alert , oriented and patient cooperative  Airway and Oxygen Therapy: Patient Spontanous Breathing  Post-op Pain: mild  Post-op Assessment: Patient's Cardiovascular Status Stable, Respiratory Function Stable, Patent Airway and Pain level controlled  Post-op Vital Signs: Reviewed and stable  Last Vitals:  Filed Vitals:   10/27/13 0725  BP: 139/90  Pulse: 60  Temp:   Resp: 18    Complications: No apparent anesthesia complications

## 2013-10-27 NOTE — Transfer of Care (Signed)
Immediate Anesthesia Transfer of Care Note  Patient: Ryan [name]  Procedure(s) Performed: Procedure(s): TOTAL KNEE ARTHROPLASTY (Right)  Patient Location: PACU  Anesthesia Type:Spinal  Level of Consciousness: awake, alert  and patient cooperative  Airway & Oxygen Therapy: Patient Spontanous Breathing and Patient connected to face mask oxygen  Post-op Assessment: Report given to PACU RN and Post -op Vital signs reviewed and stable  Post vital signs: Reviewed and stable  Complications: No apparent anesthesia complications

## 2013-10-27 NOTE — Progress Notes (Signed)
Patient ID: [name], male   DOB: 11-Aug-1945, 68 y.o.   MRN: [id number] Update   Right tka  I have evaluated Ryan [name] preoperatively, and have identified no interval changes in the medical condition and plan of care since the history and physical of record  Date 8.5.15

## 2013-10-28 LAB — BASIC METABOLIC PANEL
ANION GAP: 8 (ref 5–15)
BUN: 14 mg/dL (ref 6–23)
CO2: 29 mEq/L (ref 19–32)
CREATININE: 1.17 mg/dL (ref 0.50–1.35)
Calcium: 8.4 mg/dL (ref 8.4–10.5)
Chloride: 97 mEq/L (ref 96–112)
GFR calc non Af Amer: 63 mL/min — ABNORMAL LOW (ref >90)
GFR, EST AFRICAN AMERICAN: 73 mL/min — AB (ref >90)
Glucose, Bld: 126 mg/dL — ABNORMAL HIGH (ref 70–99)
POTASSIUM: 4.7 meq/L (ref 3.7–5.3)
SODIUM: 134 meq/L — AB (ref 137–147)

## 2013-10-28 LAB — CBC
HCT: 36.5 % — ABNORMAL LOW (ref 39.0–52.0)
Hemoglobin: 12.2 g/dL — ABNORMAL LOW (ref 13.0–17.0)
MCH: 31.2 pg (ref 26.0–34.0)
MCHC: 33.4 g/dL (ref 30.0–36.0)
MCV: 93.4 fL (ref 78.0–100.0)
PLATELETS: 209 10*3/uL (ref 150–400)
RBC: 3.91 MIL/uL — AB (ref 4.22–5.81)
RDW: 13.3 % (ref 11.5–15.5)
WBC: 15.1 10*3/uL — AB (ref 4.0–10.5)

## 2013-10-28 MED ORDER — TRAMADOL HCL 50 MG PO TABS
50.0000 mg | ORAL_TABLET | Freq: Four times a day (QID) | ORAL | Status: DC
  Administered 2013-10-28 – 2013-10-29 (×7): 50 mg via ORAL
  Filled 2013-10-28 (×7): qty 1

## 2013-10-28 NOTE — Anesthesia Postprocedure Evaluation (Signed)
  Anesthesia Post-op Note  Patient: Ryan [name]  Procedure(s) Performed: Procedure(s): TOTAL KNEE ARTHROPLASTY (Right)  Patient Location: Nursing Unit  Anesthesia Type:Spinal  Level of Consciousness: awake, alert  and oriented  Airway and Oxygen Therapy: Patient Spontanous Breathing  Post-op Pain: mild  Post-op Assessment: Post-op Vital signs reviewed, Patient's Cardiovascular Status Stable, Respiratory Function Stable, Patent Airway and No signs of Nausea or vomiting  Post-op Vital Signs: Reviewed and stable  Last Vitals:  Filed Vitals:   10/28/13 1200  BP:   Pulse:   Temp:   Resp: 18    Complications: No apparent anesthesia complications

## 2013-10-28 NOTE — Clinical Social Work Note (Signed)
CSW received referral for possible SNF. Pt evaluated by PT yesterday and had treatment today. Recommendation for home health. CSW will sign off, but can be reconsulted if needed.  Derenda FennelKara Lyzette Reinhardt, Kentucky[place]CSW [phone]

## 2013-10-28 NOTE — Progress Notes (Signed)
Physical Therapy Treatment Patient Details Name: Ryan [name] MRN: [id number] DOB: 1945-08-25 Today's Date: 10/28/2013    History of Present Illness Ryan [name], 68 y.o. male, has a history of pain and functional disability in the right knee due to arthritis and has failed non-surgical conservative treatments for greater than 12 weeks to includeNSAID's and/or analgesics, corticosteriod injections, flexibility and strengthening excercises, supervised PT with diminished ADL's post treatment, use of assistive devices and activity modification.  Onset of symptoms was gradual, starting 5 years ago with gradually worsening course since that time. The patient noted no past surgery on the right knee(s).  Patient currently rates pain in the right knee(s) at 4 out of 10 with activity. Patient has worsening of pain with activity and weight bearing, pain that interferes with activities of daily living, pain with passive range of motion, crepitus and joint swelling.  Patient has evidence of subchondral sclerosis and joint space narrowing by imaging studies. This patient has had no previous osteotomy. There is no active infection.  Pt underwent Rt TKA on 10/27/13 secondary to Rt knee OA.     PT Comments    Pt reports some nausea this morning, and was given medication in IV ~45 minutes to decrease nausea/vomiting.  Pt reports increased stiffness in the Rt knee today.  Functional mobility assessment completed.  Pt required min/mod assist for bed mobility skills for movement of Rt LE, min guard for transfers, and min guard and use of RW for gait for 5 feet.  Pt reports increased fatigue after assessment and requested return to bed.  Recommend continued PT with transition to HHPT at discharge.    Follow Up Recommendations  Home health PT     Equipment Recommendations  None recommended by PT       Precautions / Restrictions Restrictions Weight Bearing Restrictions: Yes RLE Weight Bearing: Weight bearing as  tolerated    Mobility  Bed Mobility Overal bed mobility: Needs Assistance Bed Mobility: Supine to Sit;Sit to Supine     Supine to sit: Min assist;Mod assist (for Rt LE) Sit to supine: Min assist (for Rt LE)      Transfers Overall transfer level: Needs assistance Equipment used: Rolling walker (2 wheeled) Transfers: Sit to/from Stand Sit to Stand: Min guard;From elevated surface            Ambulation/Gait Ambulation/Gait assistance: Min guard Ambulation Distance (Feet): 5 Feet Assistive device: Rolling walker (2 wheeled) Gait Pattern/deviations: Step-to pattern (Decreased knee extension Rt)   Gait velocity interpretation: Below normal speed for age/gender General Gait Details: VC required for technique for step to gait patterning          Cognition Arousal/Alertness: Awake/alert Behavior During Therapy: WFL for tasks assessed/performed Overall Cognitive Status: Within Functional Limits for tasks assessed                             Pertinent Vitals/Pain Pain 7/10 in Rt knee with transfer to EOB.             PT Goals (current goals can now be found in the care plan section) Progress towards PT goals: Progressing toward goals    Frequency  7X/week    PT Plan Current plan remains appropriate       End of Session Equipment Utilized During Treatment: Gait belt Activity Tolerance: Patient limited by pain;Patient limited by fatigue Patient left: in bed;with call bell/phone within reach;with bed alarm set;with family/visitor present  Time: 1610-9604 PT Time Calculation (min): 21 min  Charges:  $Therapeutic Activity: 8-22 mins                     Ryan [name] 10/28/2013, 11:24 AM

## 2013-10-28 NOTE — Progress Notes (Signed)
Patient had one episode of vomiting this morning, small amount.  Came on all of a sudden, patient states he feels better now, but given PRN zofran via IV line.  Will continue to monitor.  Thinking could be related to sedation yesterday with mix of pain medications.  Had been tolerating regular food since yesterday afternoon until then.

## 2013-10-28 NOTE — Progress Notes (Signed)
Patient ID: [name], male   DOB: 02/20/1946, 68 y.o.   MRN: [id number] Postop day 1 status post right total knee  Patient seems a little confused  I will change his pain medication.  Vital signs are stable labs look good.  He has good motion and neurovascular function in his foot and right lower extremity  Start PT today full weightbearing as tolerated CPM 0-60

## 2013-10-28 NOTE — Evaluation (Addendum)
Occupational Therapy Evaluation Patient Details Name: Ryan [name] MRN: [id number] DOB: [birth year] Today's Date: 10/28/2013    History of Present Illness Ryan [name], 68 y.o. male, has a history of pain and functional disability in the right knee due to arthritis and has failed non-surgical conservative treatments for greater than 12 weeks to includeNSAID's and/or analgesics, corticosteriod injections, flexibility and strengthening excercises, supervised PT with diminished ADL's post treatment, use of assistive devices and activity modification.  Onset of symptoms was gradual, starting 5 years ago with gradually worsening course since that time. The patient noted no past surgery on the right knee(s).  Patient currently rates pain in the right knee(s) at 4 out of 10 with activity. Patient has worsening of pain with activity and weight bearing, pain that interferes with activities of daily living, pain with passive range of motion, crepitus and joint swelling.  Patient has evidence of subchondral sclerosis and joint space narrowing by imaging studies. This patient has had no previous osteotomy. There is no active infection.  Pt underwent Rt TKA on 10/27/13 secondary to Rt knee OA.    Clinical Impression   Pt is presenting to acute OT with above situation.  He had good strength and ROM in BUE.  During eval he demonstrated good lower boy dressing skills, without use of AE.  Pt will also have wife at home 24/7 if ADL need were to arise that he needs assist with.  Pt needs no further OT services at this time.  Pt will benefit from shower seat. Acute OT to sign off.    Follow Up Recommendations  No OT follow up    Equipment Recommendations  Tub/shower seat    Recommendations for Other Services       Precautions / Restrictions Precautions Precautions: Fall Restrictions Weight Bearing Restrictions: Yes RLE Weight Bearing: Weight bearing as tolerated      Mobility Bed Mobility Overal bed  mobility: Needs Assistance Bed Mobility: Supine to Sit     Supine to sit: Min assist (for RLE) Sit to supine: Min assist (for Rt LE)      Transfers Overall transfer level: Needs assistance Equipment used: Rolling walker (2 wheeled) Transfers: Sit to/from Stand Sit to Stand: Min guard              Balance Overall balance assessment: Needs assistance Sitting-balance support: Feet supported;Single extremity supported Sitting balance-Leahy Scale: Good     Standing balance support: Single extremity supported;During functional activity Standing balance-Leahy Scale: Fair                              ADL Overall ADL's : Needs assistance/impaired Eating/Feeding: Modified independent;Sitting   Grooming: Modified independent;Sitting               Lower Body Dressing: Min guard;Sit to/from stand Lower Body Dressing Details (indicate cue type and reason): Educated on AE for lower body dressing, but pt did not require Toilet Transfer: Min guard;RW   Toileting- Architect and Hygiene: Min guard;Sit to/from stand;Cueing for safety       Functional mobility during ADLs: Min guard;Rolling walker       Vision                     Perception     Praxis      Pertinent Vitals/Pain Pain Assessment: 0-10 Pain Score: 4  Pain Location: right knee Pain Intervention(s): Limited activity within patient's  tolerance;Premedicated before session;Utilized relaxation techniques;Repositioned     Hand Dominance Right   Extremity/Trunk Assessment Upper Extremity Assessment Upper Extremity Assessment: Overall WFL for tasks assessed   Lower Extremity Assessment Lower Extremity Assessment: Defer to PT evaluation       Communication Communication Communication: No difficulties   Cognition Arousal/Alertness: Awake/alert Behavior During Therapy: WFL for tasks assessed/performed Overall Cognitive Status: Within Functional Limits for tasks  assessed                     General Comments       Exercises Exercises:  (Per family, pt has completed his HEP this morning. )     Shoulder Instructions      Home Living Family/patient expects to be discharged to:: Private residence Living Arrangements: Spouse/significant other Available Help at Discharge: Family;Available 24 hours/day Type of Home: House Home Access: Stairs to enter Entergy CorporationEntrance Stairs-Number of Steps: 1 Entrance Stairs-Rails: None Home Layout: Bed/bath upstairs;Two level (Pt will be staying downstairs upon d/c) Alternate Level Stairs-Number of Steps: 6   Bathroom Shower/Tub: Producer, television/film/videoWalk-in shower   Bathroom Toilet: Standard     Home Equipment: Hand held shower head;Bedside commode;Walker - 2 wheels;Cane - quad          Prior Functioning/Environment Level of Independence: Independent             OT Diagnosis:     OT Problem List:     OT Treatment/Interventions:      OT Goals(Current goals can be found in the care plan section) Acute Rehab OT Goals Patient Stated Goal: No OT goals needed OT Goal Formulation: With patient  OT Frequency:     Barriers to D/C:            Co-evaluation              End of Session Equipment Utilized During Treatment: Gait belt;Rolling walker  Activity Tolerance: Patient tolerated treatment well Patient left: in bed (with PT in room)   Time: 1320-1350 OT Time Calculation (min): 30 min Charges:  OT General Charges $OT Visit: 1 Procedure OT Evaluation $Initial OT Evaluation Tier I: 1 Procedure G-Codes:     Marry GuanMarie Rawlings, MS, OTR/L [phone]  10/28/2013, 2:00 PM

## 2013-10-28 NOTE — Progress Notes (Signed)
Physical Therapy Treatment Patient Details Name: Ryan [name] MRN: [id number] DOB: 10-Sep-1945 Today's Date: 10/28/2013    History of Present Illness Ryan [name], 68 y.o. male, has a history of pain and functional disability in the right knee due to arthritis and has failed non-surgical conservative treatments for greater than 12 weeks to includeNSAID's and/or analgesics, corticosteriod injections, flexibility and strengthening excercises, supervised PT with diminished ADL's post treatment, use of assistive devices and activity modification.  Onset of symptoms was gradual, starting 5 years ago with gradually worsening course since that time. The patient noted no past surgery on the right knee(s).  Patient currently rates pain in the right knee(s) at 4 out of 10 with activity. Patient has worsening of pain with activity and weight bearing, pain that interferes with activities of daily living, pain with passive range of motion, crepitus and joint swelling.  Patient has evidence of subchondral sclerosis and joint space narrowing by imaging studies. This patient has had no previous osteotomy. There is no active infection.  Pt underwent Rt TKA on 10/27/13 secondary to Rt knee OA.     PT Comments    Pt eager to participate with therapy today.  Pt limited by pain scale 9/10 postsurgery for Rt knee.  AROM 12-64 today supine position.  Min guard with bed mobility with assistance of Rt knee.  Sit to stand required min assistance with cueing to slide Rt foot forward to assist with pain descending to bed.  Gait training with RW 20 feet with cueing for heel to toe and equal stride length and stance phase.  Pt unable to place heel on floor due to lacking knee extension.  Pt left in bed with CPM set 0-60 degrees, call bell within reach and family present in room.  Ice applied at end of session for pain control.   Follow Up Recommendations  Home health PT     Equipment Recommendations  None recommended by PT     Recommendations for Other Services       Precautions / Restrictions Precautions Precautions: Fall Restrictions Weight Bearing Restrictions: Yes RLE Weight Bearing: Weight bearing as tolerated    Mobility  Bed Mobility Overal bed mobility: Needs Assistance Bed Mobility: Supine to Sit     Supine to sit: Min assist Sit to supine: Min assist   General bed mobility comments: Assistance required with Rt LE with supine to sit and sit to supine.   Transfers Overall transfer level: Needs assistance Equipment used: Rolling walker (2 wheeled) Transfers: Sit to/from Stand Sit to Stand: Min guard         General transfer comment: Verbal cueing to slide Rt foot forward prior sitting for pain control    Ambulation/Gait Ambulation/Gait assistance: Min guard Ambulation Distance (Feet): 20 Feet Assistive device: Rolling walker (2 wheeled) Gait Pattern/deviations: Step-to pattern;Decreased stance time - right;Decreased step length - left   Gait velocity interpretation: Below normal speed for age/gender General Gait Details: VC required for technique for step to gait patterning   Stairs            Wheelchair Mobility    Modified Rankin (Stroke Patients Only)       Balance Overall balance assessment: Needs assistance Sitting-balance support: Feet supported;Single extremity supported Sitting balance-Leahy Scale: Good     Standing balance support: Single extremity supported;During functional activity Standing balance-Leahy Scale: Fair                      Cognition  Arousal/Alertness: Awake/alert Behavior During Therapy: WFL for tasks assessed/performed Overall Cognitive Status: Within Functional Limits for tasks assessed                      Exercises Total Joint Exercises Ankle Circles/Pumps: AROM;Both;Supine;15 reps Quad Sets: Strengthening;Both;10 reps;Supine Heel Slides: AROM;AAROM;Both;10 reps;Supine Hip ABduction/ADduction:  AROM;AAROM;Both;10 reps;Supine Goniometric ROM: 10-64    General Comments        Pertinent Vitals/Pain Pain Assessment: 0-10 Pain Score: 9  Pain Location: Rt knee Pain Descriptors / Indicators:  (Post surgical) Pain Intervention(s): Ice applied    Home Living Family/patient expects to be discharged to:: Private residence Living Arrangements: Spouse/significant other Available Help at Discharge: Family;Available 24 hours/day Type of Home: House Home Access: Stairs to enter Entrance Stairs-Rails: None Home Layout: Bed/bath upstairs;Two level (Pt will be staying downstairs upon d/c) Home Equipment: Hand held shower head;Bedside commode;Walker - 2 wheels;Cane - quad      Prior Function Level of Independence: Independent          PT Goals (current goals can now be found in the care plan section) Acute Rehab PT Goals Patient Stated Goal: No OT goals needed Progress towards PT goals: Progressing toward goals    Frequency  7X/week    PT Plan Current plan remains appropriate    Co-evaluation             End of Session Equipment Utilized During Treatment: Gait belt Activity Tolerance: Patient limited by pain;Patient limited by fatigue;Patient tolerated treatment well Patient left: in bed;in CPM     Time: 1350-1415 PT Time Calculation (min): 25 min  Charges:  $Gait Training: 8-22 mins $Therapeutic Exercise: 8-22 mins $Therapeutic Activity: 8-22 mins                    G Codes:      Juel BurrowCockerham, Jarad Barth Jo 10/28/2013, 2:39 PM

## 2013-10-28 NOTE — Addendum Note (Signed)
Addendum created 10/28/13 1456 by Moshe SalisburyKaren E Manuelita Moxon, CRNA   Modules edited: Notes Section   Notes Section:  File: [id number]

## 2013-10-28 NOTE — Care Management Note (Addendum)
    Page 1 of 2   10/29/2013     1:21:18 PM CARE MANAGEMENT NOTE 10/29/2013  Patient:  Ryan [name],Ryan [name]   Account Number:  [id number]  Date Initiated:  10/28/2013  Documentation initiated by:  Sharrie RothmanBLACKWELL,Thanos Cousineau C  Subjective/Objective Assessment:   Pt admitted from home s/p right knee surgery. Pt lives with his wife and will return home at discharge. Pt has walker and BSC for home use.     Action/Plan:   [facility]. [facility] and CPm will be delivered to pts home at discharge.   Anticipated DC Date:  10/30/2013   Anticipated DC Plan:  HOME W HOME HEALTH SERVICES      DC Planning Services  CM consult      PAC Choice  DURABLE MEDICAL EQUIPMENT  HOME HEALTH   Choice offered to / List presented to:  C-1 Patient   DME arranged  CPM      DME agency  [facility]     HH arranged  HH-2 PT      [facility]   Status of service:  Completed, signed off Medicare Important Message given?  YES (If response is "NO", the following Medicare IM given date fields will be blank) Date Medicare IM given:  10/29/2013 Medicare IM given by:  Sharrie RothmanBLACKWELL,Jamorion Gomillion C Date Additional Medicare IM given:   Additional Medicare IM given by:    Discharge Disposition:  HOME W HOME HEALTH SERVICES  Per UR Regulation:    If discussed at Long Length of Stay Meetings, dates discussed:    Comments:  10/29/13 1310 Arlyss Queenammy Ean Gettel, RN BSN CM Pt [facility] be [facility] over the weekend [facility] and CPM with [facility]. Alroy BailiffLinda Lothian [facility] aware and will collect the pts information from the chart. HH services to start within 48 hours of discharge. [facility]. Weekend staff [facility] and [facility] when written.  10/28/13 1430 Arlyss Queenammy Kaeleb Emond, RN BSN CM

## 2013-10-29 ENCOUNTER — Encounter (HOSPITAL_COMMUNITY): Payer: Self-pay | Admitting: Orthopedic Surgery

## 2013-10-29 LAB — CBC
HCT: 35.1 % — ABNORMAL LOW (ref 39.0–52.0)
HEMOGLOBIN: 11.9 g/dL — AB (ref 13.0–17.0)
MCH: 31.1 pg (ref 26.0–34.0)
MCHC: 33.9 g/dL (ref 30.0–36.0)
MCV: 91.6 fL (ref 78.0–100.0)
Platelets: 208 10*3/uL (ref 150–400)
RBC: 3.83 MIL/uL — ABNORMAL LOW (ref 4.22–5.81)
RDW: 13.1 % (ref 11.5–15.5)
WBC: 14.9 10*3/uL — AB (ref 4.0–10.5)

## 2013-10-29 NOTE — Progress Notes (Signed)
Physical Therapy Treatment Patient Details Name: Ryan [name] MRN: [id number] DOB: Dec 28, 1945 Today's Date: 10/29/2013    History of Present Illness Ryan [name], 68 y.o. male, has a history of pain and functional disability in the right knee due to arthritis and has failed non-surgical conservative treatments for greater than 12 weeks to includeNSAID's and/or analgesics, corticosteriod injections, flexibility and strengthening excercises, supervised PT with diminished ADL's post treatment, use of assistive devices and activity modification.  Onset of symptoms was gradual, starting 5 years ago with gradually worsening course since that time. The patient noted no past surgery on the right knee(s).  Patient currently rates pain in the right knee(s) at 4 out of 10 with activity. Patient has worsening of pain with activity and weight bearing, pain that interferes with activities of daily living, pain with passive range of motion, crepitus and joint swelling.  Patient has evidence of subchondral sclerosis and joint space narrowing by imaging studies. This patient has had no previous osteotomy. There is no active infection.  Pt underwent Rt TKA on 10/27/13 secondary to Rt knee OA.     PT Comments    Patient is progressing well. Expect discharge home tomorrow with family for assistance and home health PT for continued strengthening.  Follow Up Recommendations  Home health PT     Equipment Recommendations  None recommended by PT    Recommendations for Other Services       Precautions / Restrictions Precautions Precautions: Fall Restrictions Weight Bearing Restrictions: Yes RLE Weight Bearing: Weight bearing as tolerated    Mobility  Bed Mobility Overal bed mobility: Modified Independent Bed Mobility: Supine to Sit;Sit to Supine     Supine to sit: Modified independent (Device/Increase time) Sit to supine: Modified independent (Device/Increase time)   General bed mobility comments:  Assistance no longer required with Rt LE with supine to sit and sit to supine.   Transfers Overall transfer level: Modified independent Equipment used: Rolling walker (2 wheeled) Transfers: Sit to/from Stand Sit to Stand: Min guard            Ambulation/Gait Ambulation/Gait assistance: Min guard;Modified independent (Device/Increase time) Ambulation Distance (Feet): 30 Feet Assistive device: Rolling walker (2 wheeled)              Balance Overall balance assessment: Modified Independent Sitting-balance support: Feet supported Sitting balance-Leahy Scale: Good     Standing balance support: Single extremity supported Standing balance-Leahy Scale: Fair Standing balance comment: one hand hold ssist on walker required due to pain with Rt LE weight bearing                    Cognition Arousal/Alertness: Awake/alert Behavior During Therapy: WFL for tasks assessed/performed Overall Cognitive Status: Within Functional Limits for tasks assessed                      Exercises Total Joint Exercises Ankle Circles/Pumps: AROM;Both;Supine;15 reps Quad Sets: Strengthening;Both;10 reps;Supine Heel Slides: AROM;AAROM;Both;10 reps;Supine Hip ABduction/ADduction: AROM;AAROM;Both;10 reps;Supine    General Comments        Pertinent Vitals/Pain Pain Location: rt knee Pain Descriptors / Indicators: Aching Pain Intervention(s): Patient requesting pain meds-RN notified           PT Goals (current goals can now be found in the care plan section) Progress towards PT goals: Progressing toward goals    Frequency  7X/week    PT Plan Current plan remains appropriate       End of Session  Activity Tolerance: Patient limited by fatigue Patient left: in bed;with call bell/phone within reach;with family/visitor present     Time: 1530-1550 PT Time Calculation (min): 20 min  Charges:  $Gait Training: 8-22 mins                    G Codes:      Ryan [name],  Ryan [name] 02-Nov-2013, 5:37 PM

## 2013-10-29 NOTE — Progress Notes (Signed)
Physical Therapy Treatment Patient Details Name: Ryan [name] [name] MRN: [id number] DOB: 03/18/1946 Today's Date: 10/29/2013    History of Present Illness Ryan [name] Ryan [name], 68 y.o. male, has a history of pain and functional disability in the right knee due to arthritis and has failed non-surgical conservative treatments for greater than 12 weeks to includeNSAID's and/or analgesics, corticosteriod injections, flexibility and strengthening excercises, supervised PT with diminished ADL's post treatment, use of assistive devices and activity modification.  Onset of symptoms was gradual, starting 5 years ago with gradually worsening course since that time. The patient noted no past surgery on the right knee(s).  Patient currently rates pain in the right knee(s) at 4 out of 10 with activity. Patient has worsening of pain with activity and weight bearing, pain that interferes with activities of daily living, pain with passive range of motion, crepitus and joint swelling.  Patient has evidence of subchondral sclerosis and joint space narrowing by imaging studies. This patient has had no previous osteotomy. There is no active infection.  Pt underwent Rt TKA on 10/27/13 secondary to Rt knee OA.     PT Comments    Pt reports mild complaints of pain at 4/10 prior to treatment, and he reports he has not taken his pain medication as he did not want it.  Noted improved ambulation distance today to 25 feet with step to gait patterning and use of RW.  VC required for attempts at heel-toe gait patterning though pt has flat foot initial contact due to decreased knee extension.  Without continual VC pt amb toe touch WB on the Rt.  Pt able to complete seated exercises at EOB, with AAROM for LAQ to complete ROM.  Complaints of pain reported with supine heel slides once transferred back to bed, though no other complaints of pain with exercises.  Pt requesting pain medication by end of treatment, RN made aware.  Recommend continued PT  to address strengthening, ROM, activity tolerance for improved functional mobility skills.    Follow Up Recommendations  Home health PT     Equipment Recommendations  None recommended by PT       Precautions / Restrictions Restrictions Weight Bearing Restrictions: Yes RLE Weight Bearing: Weight bearing as tolerated    Mobility  Bed Mobility Overal bed mobility: Needs Assistance Bed Mobility: Supine to Sit;Sit to Supine     Supine to sit: Min assist (for Rt LE) Sit to supine: Min assist (for Rt LE)      Transfers Overall transfer level: Needs assistance Equipment used: Rolling walker (2 wheeled) Transfers: Sit to/from Stand Sit to Stand: Min guard            Ambulation/Gait Ambulation/Gait assistance: Min guard Ambulation Distance (Feet): 25 Feet Assistive device: Rolling walker (2 wheeled) Gait Pattern/deviations: Step-to pattern;Decreased step length - right;Decreased step length - left   Gait velocity interpretation: Below normal speed for age/gender              Exercises General Exercises - Lower Extremity Long Arc Quad: 10 reps;AROM;AAROM;Both;Seated (AROM Lt, AAROM Rt LE) Heel Slides: 10 reps;AROM;Both;Supine Hip ABduction/ADduction: 10 reps;Strengthening;Both;Supine Toe Raises: 10 reps;AROM;Both;Seated Heel Raises: 10 reps;AROM;Both;Seated Other Exercises Other Exercises: Gastroc Stretch 20" x2, Rt Other Exercises: HS Stretch 20" x2, Rt        Pertinent Vitals/Pain Pain Assessment: 0-10 Pain Score: 4  Pain Location: Rt knee Pain Intervention(s): Patient requesting pain meds-RN notified           PT Goals (current goals can  now be found in the care plan section) Progress towards PT goals: Progressing toward goals    Frequency  7X/week    PT Plan Current plan remains appropriate       End of Session Equipment Utilized During Treatment: Gait belt Activity Tolerance: Patient limited by fatigue Patient left: in bed;with call  bell/phone within reach;with family/visitor present     Time: 4782-9562 PT Time Calculation (min): 27 min  Charges:  $Gait Training: 8-22 mins $Therapeutic Exercise: 8-22 mins                     Tandi Hanko 10/29/2013, 11:15 AM

## 2013-10-29 NOTE — Progress Notes (Signed)
Patient ID: [name], male   DOB: Dec 22, 1945, 68 y.o.   MRN: [id number] This is POD# 2 right total knee patient in stable condition. Dressing changed wound is clean  Therapy is going well plan for discharge tomorrow  hemoglobin Hemoglobin & Hematocrit     Component Value Date/Time   HGB 11.9* 10/29/2013 0548   HCT 35.1* 10/29/2013 0548

## 2013-10-30 LAB — CBC
HCT: 33.1 % — ABNORMAL LOW (ref 39.0–52.0)
HEMOGLOBIN: 11.3 g/dL — AB (ref 13.0–17.0)
MCH: 31.3 pg (ref 26.0–34.0)
MCHC: 34.1 g/dL (ref 30.0–36.0)
MCV: 91.7 fL (ref 78.0–100.0)
PLATELETS: 211 10*3/uL (ref 150–400)
RBC: 3.61 MIL/uL — AB (ref 4.22–5.81)
RDW: 12.9 % (ref 11.5–15.5)
WBC: 12 10*3/uL — ABNORMAL HIGH (ref 4.0–10.5)

## 2013-10-30 MED ORDER — ASPIRIN 325 MG PO TBEC: 325.0000 mg | DELAYED_RELEASE_TABLET | Freq: Two times a day (BID) | ORAL | Status: DC

## 2013-10-30 MED ORDER — TRAMADOL HCL 50 MG PO TABS: 50.0000 mg | ORAL_TABLET | Freq: Four times a day (QID) | ORAL | Status: DC

## 2013-10-30 MED ORDER — TEMAZEPAM 15 MG PO CAPS: 15.0000 mg | ORAL_CAPSULE | Freq: Every evening | ORAL | Status: DC | PRN

## 2013-10-30 MED ORDER — DSS 100 MG PO CAPS: 100.0000 mg | ORAL_CAPSULE | Freq: Two times a day (BID) | ORAL | Status: DC

## 2013-10-30 NOTE — Discharge Summary (Signed)
Physician Discharge Summary  Patient ID: ALHAJI MCNEAL MRN: [id number] DOB/AGE: February 05, 1946 68 y.o.  Admit date: 10/27/2013 Discharge date: 10/30/2013  Admission Diagnoses: Osteoarthritis right knee  Discharge diagnosis same  Active problem osteoarthritis of the knee  Discharge condition stable  Hospital course: The patient was admitted on August 5 for right total knee arthroplasty. On same day he underwent uncomplicated right total knee arthroplasty with Depew posterior stabilized Sigma implant under spinal anesthetic. No complications.  Postoperatively he did well with pain control, physical therapy and CPM machine. He progressed in a normal fashion. He was discharged home in stable condition. His wound is clean. His pain was under control.  Discharge Exam: Blood pressure 130/70, pulse 67, temperature 98.2 F (36.8 C), temperature source Oral, resp. rate 18, height 5\' 8"  (1.727 m), weight 206 lb 4.8 oz (93.577 kg), SpO2 92.00%.   Disposition: 01-Home or Self Care  Discharge Instructions   CPM    Complete by:  As directed   Continuous passive motion machine (CPM):      Use the CPM from 0 to 70 for 8 hours per day.      You may increase by 10 per day.  You may break it up into 2 or 3 sessions per day.      Use CPM for 3 weeks or until you are told to stop.     Call MD / Call 911    Complete by:  As directed   If you experience chest pain or shortness of breath, CALL 911 and be transported to the hospital emergency room.  If you develope a fever above 101 F, pus (white drainage) or increased drainage or redness at the wound, or calf pain, call your surgeon's office.     Constipation Prevention    Complete by:  As directed   Drink plenty of fluids.  Prune juice may be helpful.  You may use a stool softener, such as Colace (over the counter) 100 mg twice a day.  Use MiraLax (over the counter) for constipation as needed.     Diet - low sodium heart healthy    Complete by:  As directed       Discharge instructions    Complete by:  As directed   Take aspirin for 6 weeks  Wear stockings for 4 weeks  Use compression devices for 2 weeks     Increase activity slowly as tolerated    Complete by:  As directed             Medication List         aspirin 325 MG EC tablet  Take 1 tablet (325 mg total) by mouth 2 (two) times daily.     DSS 100 MG Caps  Take 100 mg by mouth 2 (two) times daily.     hydrochlorothiazide 12.5 MG capsule  Commonly known as:  MICROZIDE  Take 12.5 mg by mouth every morning.     temazepam 15 MG capsule  Commonly known as:  RESTORIL  Take 1 capsule (15 mg total) by mouth at bedtime as needed for sleep.     traMADol 50 MG tablet  Commonly known as:  ULTRAM  Take 1 tablet (50 mg total) by mouth every 6 (six) hours.           Follow-up Information   Follow up with [facility].   Contact information:   [address] [place] [address] [phone]  Follow up with Fuller CanadaStanley Janeisha Ryle, MD.   Specialties:  Orthopedic Surgery, Radiology   Contact information:   [address], [address], [address] [place] [address] [phone]       Signed: Fuller CanadaStanley Briona Korpela 10/30/2013, 11:38 AM

## 2013-10-30 NOTE — Progress Notes (Signed)
Patient received discharge instructions and prescriptions and had no further questions/concerns.  Patient's IV was removed and was clean, dry, and intact at removal.  Patient was escorted to vehicle via wheelchair by nurse tech.

## 2013-10-31 LAB — TYPE AND SCREEN
ABO/RH(D): A POS
ANTIBODY SCREEN: NEGATIVE
UNIT DIVISION: 0
Unit division: 0

## 2013-11-04 ENCOUNTER — Ambulatory Visit (INDEPENDENT_AMBULATORY_CARE_PROVIDER_SITE_OTHER): Payer: Self-pay | Admitting: Orthopedic Surgery

## 2013-11-04 ENCOUNTER — Encounter: Payer: Self-pay | Admitting: Orthopedic Surgery

## 2013-11-04 VITALS — BP 127/60 | Ht 68.0 in | Wt 206.3 lb

## 2013-11-04 DIAGNOSIS — Z96651 Presence of right artificial knee joint: Secondary | ICD-10-CM

## 2013-11-04 DIAGNOSIS — Z96659 Presence of unspecified artificial knee joint: Secondary | ICD-10-CM

## 2013-11-04 NOTE — Progress Notes (Signed)
Postop  Chief Complaint  Patient presents with  . Follow-up    post op #1, Right TKA, DOS 10/27/13    Postop visit status post total knee arthroplasty  Diagnosis  Osteoarthritis Operative findings medial compartment gonarthrosis primarily Implant Depuy  posterior stabilized total knee replacement DVT prophylaxis Ecotrin twice a day with home compression devices  Living situation home Complaints none, 1-2 tramadol a day CPM machine is at 95 advanced 200 today Exam clean incision, 95 flexion. Therapy notes indicate greater than 100 feet ambulation Plan is return in a week for staples to come out, start therapy August 24

## 2013-11-04 NOTE — Patient Instructions (Signed)
Call to arrange therapy starting 11/15/13

## 2013-11-11 ENCOUNTER — Ambulatory Visit (INDEPENDENT_AMBULATORY_CARE_PROVIDER_SITE_OTHER): Payer: Self-pay | Admitting: Orthopedic Surgery

## 2013-11-11 ENCOUNTER — Encounter: Payer: Self-pay | Admitting: Orthopedic Surgery

## 2013-11-11 VITALS — BP 121/80 | Ht 68.0 in | Wt 206.3 lb

## 2013-11-11 DIAGNOSIS — Z96651 Presence of right artificial knee joint: Secondary | ICD-10-CM

## 2013-11-11 DIAGNOSIS — Z96659 Presence of unspecified artificial knee joint: Secondary | ICD-10-CM

## 2013-11-11 NOTE — Patient Instructions (Signed)
Continue aspirin 

## 2013-11-11 NOTE — Progress Notes (Signed)
Chief Complaint  Patient presents with  . Follow-up    post op #2, remove staples, Right TKA, DOS 10/27/13   BP 121/80  Ht 5\' 8"  (1.727 m)  Wt 206 lb 4.8 oz (93.577 kg)  BMI 31.38 kg/m2  The suture line looks good staples were taken out the patient says he is feeling good is not taking any pain medication anymore. He will start therapy next week he'll followup with me in a month

## 2013-11-16 ENCOUNTER — Ambulatory Visit (HOSPITAL_COMMUNITY)
Admission: RE | Admit: 2013-11-16 | Discharge: 2013-11-16 | Disposition: A | Discharge status: Home / Self Care | Payer: Medicare Other | Source: Ambulatory Visit | Attending: Orthopedic Surgery | Admitting: Orthopedic Surgery

## 2013-11-16 DIAGNOSIS — IMO0001 Reserved for inherently not codable concepts without codable children: Secondary | ICD-10-CM | POA: Diagnosis not present

## 2013-11-16 DIAGNOSIS — M549 Dorsalgia, unspecified: Secondary | ICD-10-CM | POA: Insufficient documentation

## 2013-11-16 DIAGNOSIS — M25559 Pain in unspecified hip: Secondary | ICD-10-CM | POA: Insufficient documentation

## 2013-11-16 DIAGNOSIS — R262 Difficulty in walking, not elsewhere classified: Secondary | ICD-10-CM | POA: Diagnosis not present
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
## 2013-11-18 ENCOUNTER — Ambulatory Visit (HOSPITAL_COMMUNITY)
Admission: RE | Admit: 2013-11-18 | Discharge: 2013-11-18 | Disposition: A | Discharge status: Home / Self Care | Payer: Medicare Other | Source: Ambulatory Visit | Attending: Orthopedic Surgery | Admitting: Orthopedic Surgery

## 2013-11-18 DIAGNOSIS — IMO0001 Reserved for inherently not codable concepts without codable children: Secondary | ICD-10-CM | POA: Diagnosis not present

## 2013-11-18 NOTE — Progress Notes (Signed)
Physical Therapy Treatment Patient Details  Name: Ryan [name] MRN: [id number] Date of Birth: 13-Feb-1946  Today's Date: 11/18/2013 Time: 4098-1191 PT Time Calculation (min): 45 min  Visit#: 2 of 24  Re-eval:   Assessment Diagnosis: Rt TKA Surgical Date: 10/27/13 Next MD Visit: Romeo Apple 12/12/13 Authorization: medicare  Authorization Visit#: 2 of 10  Charges:  therex 45  Subjective: Symptoms/Limitations Symptoms: PT comes today without SPC.  States his knee is not hurting and is no longer taking prescription pain meds. Pain Assessment Currently in Pain?: No/denies   Exercise/Treatments Stretches Active Hamstring Stretch: 2 reps;30 seconds Active Hamstring Stretch Limitations: on 14" box Hip Flexor Stretch: 3 reps;30 seconds Hip Flexor Stretch Limitations: hip flexor and groin stretch to 14" Gastroc Stretch: 3 reps;30 seconds;Limitations Gastroc Stretch Limitations: standing slant board Aerobic Stationary Bike: 8' seat 10 full revolutions Standing Functional Squat Limitations: 3D hip excursion 10x Supine Quad Sets: 20 reps Bridges: 10 reps Straight Leg Raises: 10 reps;Limitations Prone  Hamstring Curl: 10 reps Prone Knee Hang: 3 minutes;Limitations Prone Knee Hang Limitations: with MFR to posterior knee and hamstring     Physical Therapy Assessment and Plan PT Assessment and Plan Clinical Impression Statement: Progressed with bike to increase knee mobility.  Continued stretches to increase mobiltiiy of surrounding musculature.  Pt able to complete with therapist facilitation for posture and form.   Added prone knee hang and completed myofascial to posterior knee and hamstring.  No adhesions noted anterior knee.  Increased ROM today 6-120 in supine (was 12-118 last visit).  Pt compliaint with HEP and very active with waliking program at home.  PT Plan: Initial focus on increasing Rt LE mobility to normalize gait. As mobility improves focus to shift towards strengtheing  so patient can return to working in his garden and bear weight while kneeling on Rt knee.      Problem List Patient Active Problem List   Diagnosis Date Noted  . OA (osteoarthritis) of knee 10/27/2013  . Back pain 09/23/2013  . Bilateral leg weakness 09/23/2013  . Difficulty in walking(719.7) 09/23/2013  . Olecranon fracture 09/29/2012  . Draining cutaneous sinus tract 08/24/2012  . Septic joint of left elbow 08/24/2012  . Fracture of ulna, olecranon 08/18/2012  . Elbow locking 02/18/2012  . Postoperative wound breakdown 07/02/2011  . Fibrous non-union 06/03/2011  . Bursitis of elbow 06/03/2011  . Olecranon bursitis 05/06/2011  . JOINT EFFUSION, RIGHT KNEE 06/06/2010  . LOOSE BODY-KNEE 04/09/2010  . OSTEOCHONDRITIS DESSICANS 04/09/2010  . ARTHROSCOPY, RIGHT KNEE, HX OF 04/09/2010  . TEAR MEDIAL MENISCUS 04/04/2010  . TOTAL KNEE FOLLOW-UP 09/29/2008  . OLD DISRUPTION OF POSTERIOR CRUCIATE LIGAMENT 10/14/2007  . DEGENERATIVE JOINT DISEASE, KNEE 02/26/2007  . ENTHESOPATHY, KNEE NEC 01/19/2007    PT - End of Session Activity Tolerance: Patient limited by fatigue General Behavior During Therapy: [facility] for tasks assessed/performed   Lurena Nida, PTA/CLT 11/18/2013, 11:47 AM

## 2013-11-22 ENCOUNTER — Ambulatory Visit (HOSPITAL_COMMUNITY)
Admission: RE | Admit: 2013-11-22 | Discharge: 2013-11-22 | Disposition: A | Discharge status: Home / Self Care | Payer: Medicare Other | Source: Ambulatory Visit | Attending: Orthopedic Surgery | Admitting: Orthopedic Surgery

## 2013-11-22 DIAGNOSIS — IMO0001 Reserved for inherently not codable concepts without codable children: Secondary | ICD-10-CM | POA: Diagnosis not present

## 2013-11-22 NOTE — Progress Notes (Signed)
Physical Therapy Treatment Patient Details  Name: Ryan [name] MRN: [id number] Date of Birth: 1945-09-02  Today's Date: 11/22/2013 Time: 2202-5427 PT Time Calculation (min): 55 min Charge: TE 0623-7628   Visit#: 3 of 24  Re-eval: 12/14/13 Assessment Diagnosis: Rt TKA Surgical Date: 10/27/13 Next MD Visit: Aline Brochure 12/12/13 Prior Therapy: for Lt TKR   Authorization: medicare  Authorization Time Period:    Authorization Visit#: 3 of 10   Subjective: Symptoms/Limitations Symptoms: Pt walked into dept without SPC, stated he has been compliant with HEP and has been walking a lot.   Pain Assessment Currently in Pain?: No/denies  Precautions/Restrictions  Restrictions Weight Bearing Restrictions: No RLE Weight Bearing: Weight bearing as tolerated  Exercise/Treatments Stretches Active Hamstring Stretch: 3 reps;30 seconds;Limitations Active Hamstring Stretch Limitations: on 14" box Quad Stretch: 3 reps;30 seconds;Limitations Quad Stretch Limitations: prone with rope Knee: Self-Stretch to increase Flexion: 5 reps Gastroc Stretch: 3 reps;30 seconds;Limitations Gastroc Stretch Limitations: standing slant board Aerobic Stationary Bike: 8' seat 8 full revolutions Standing Functional Squat Limitations: 3D hip excursion 10x Supine Quad Sets: 20 reps Short Arc Quad Sets: 20 reps Terminal Knee Extension: AAROM;10 reps Knee Extension: PROM Prone  Prone Knee Hang: Weights;3 minutes Prone Knee Hang Weights (lbs): 2 Prone Knee Hang Limitations: with MFR to posterior knee and hamstring      Physical Therapy Assessment and Plan PT Assessment and Plan Clinical Impression Statement: Session focus on improving knee mobilty to improve AROM and hip mobilty.  Manual techniques completewith MFR to posterior knee and hamstrings. Added supine TKE to improve extension with multimodal cueing for correct mm activation, pt with tendency to SLR.  Improved technique with SAQ.  Improved  extension to lacking 4 degrees at end of session.   PT Plan: Initial focus on increasing Rt LE mobility to normalize gait. As mobility improves focus to shift towards strengtheing so patient can return to working in his garden and bear weight while kneeling on Rt knee.     Goals PT Short Term Goals PT Short Term Goal 1: Patient will display improved knee extesnion to > -5 degrees  to increased Rt stride length PT Short Term Goal 1 - Progress: Progressing toward goal PT Short Term Goal 2: Pt be able to fully flex kneeto allow pt to squat down to pick an item off the floor PT Short Term Goal 2 - Progress: Progressing toward goal PT Short Term Goal 3: Pt will state independence with perforamance of HEP PT Short Term Goal 3 - Progress: Met PT Long Term Goals PT Long Term Goal 1: Patient will demosntrate 4/5 MMT for knee extension indicatign improved ability to ambulate up  stairs PT Long Term Goal 2: Patient will demosntrate 4/5 MMT for knee flexion indicating improved ability to ambulate down stairs Long Term Goal 3: Patient will dmeosntrate increased glut med/max strength to 4/5 MMt to indicate increased ability to single leg stand >10 5 seconds bilaterally and ambualte without trendelenberg gait.  Long Term Goal 3 Progress: Progressing toward goal Long Term Goal 4: Patient will report being abkle to walk >1 hour without pain >2/10 PT Long Term Goal 5: patient will demsontrate full knee AROM and 4+/5 MMT glut strength to be able to place 100% weight on knees and stand from the floor to patient can perform regular gardening activities  Problem List Patient Active Problem List   Diagnosis Date Noted  . OA (osteoarthritis) of knee 10/27/2013  . Back pain 09/23/2013  . Bilateral leg weakness 09/23/2013  .  Difficulty in walking(719.7) 09/23/2013  . Olecranon fracture 09/29/2012  . Draining cutaneous sinus tract 08/24/2012  . Septic joint of left elbow 08/24/2012  . Fracture of ulna, olecranon  08/18/2012  . Elbow locking 02/18/2012  . Postoperative wound breakdown 07/02/2011  . Fibrous non-union 06/03/2011  . Bursitis of elbow 06/03/2011  . Olecranon bursitis 05/06/2011  . JOINT EFFUSION, RIGHT KNEE 06/06/2010  . LOOSE BODY-KNEE 04/09/2010  . OSTEOCHONDRITIS DESSICANS 04/09/2010  . ARTHROSCOPY, RIGHT KNEE, HX OF 04/09/2010  . TEAR MEDIAL MENISCUS 04/04/2010  . TOTAL KNEE FOLLOW-UP 09/29/2008  . OLD DISRUPTION OF POSTERIOR CRUCIATE LIGAMENT 10/14/2007  . DEGENERATIVE JOINT DISEASE, KNEE 02/26/2007  . ENTHESOPATHY, KNEE NEC 01/19/2007    PT - End of Session Activity Tolerance: Patient tolerated treatment well General Behavior During Therapy: [facility] for tasks assessed/performed  GP    Aldona Lento 11/22/2013, 4:21 PM

## 2013-11-25 ENCOUNTER — Ambulatory Visit (HOSPITAL_COMMUNITY)
Admission: RE | Admit: 2013-11-25 | Discharge: 2013-11-25 | Disposition: A | Discharge status: Home / Self Care | Payer: Medicare Other | Source: Ambulatory Visit | Attending: Orthopedic Surgery | Admitting: Orthopedic Surgery

## 2013-11-25 DIAGNOSIS — R262 Difficulty in walking, not elsewhere classified: Secondary | ICD-10-CM | POA: Diagnosis not present

## 2013-11-25 DIAGNOSIS — M549 Dorsalgia, unspecified: Secondary | ICD-10-CM | POA: Diagnosis not present

## 2013-11-25 DIAGNOSIS — M25559 Pain in unspecified hip: Secondary | ICD-10-CM | POA: Diagnosis not present

## 2013-11-25 DIAGNOSIS — IMO0001 Reserved for inherently not codable concepts without codable children: Secondary | ICD-10-CM | POA: Diagnosis present

## 2013-11-25 NOTE — Progress Notes (Signed)
Physical Therapy Treatment Patient Details  Name: Ryan [name] MRN: [id number] Date of Birth: [birth year]  Today's Date: 11/25/2013 Time: 1510-1600 PT Time Calculation (min): 50 min Charge: TE 1510-1600   Visit#: 4 of 24  Re-eval: 12/14/13 Assessment Diagnosis: Rt TKA Surgical Date: 10/27/13 Next MD Visit: Aline Brochure 12/12/13 Prior Therapy: for Lt TKR   Authorization: medicare  Authorization Time Period:    Authorization Visit#: 4 of 10   Subjective: Symptoms/Limitations Symptoms: Pain scale 2/10 Rt knee, has been doing a lot of walking today Pain Assessment Currently in Pain?: Yes Pain Score: 2  Pain Location: Knee Pain Orientation: Right  Objective:   Exercise/Treatments Stretches Active Hamstring Stretch: 3 reps;30 seconds;Limitations Active Hamstring Stretch Limitations: on 14" box Quad Stretch: 3 reps;30 seconds;Limitations Quad Stretch Limitations: prone with rope Hip Flexor Stretch: 3 reps;30 seconds Hip Flexor Stretch Limitations: hip flexor and groin stretch to 14" Gastroc Stretch: 3 reps;30 seconds;Limitations Gastroc Stretch Limitations: standing slant board Aerobic Stationary Bike: 8' seat 8 full revolutions Standing Heel Raises: Limitations Heel Raises Limitations: Heel walking 2RT Terminal Knee Extension: Right;10 reps;Theraband Theraband Level (Terminal Knee Extension): Level 4 (Blue) Functional Squat Limitations: 3D hip excursion 10x Supine Quad Sets: 20 reps Short Arc Quad Sets: 20 reps Terminal Knee Extension: AAROM;10 reps Prone  Prone Knee Hang: 5 minutes;Limitations Prone Knee Hang Limitations: with MFR to posterior knee and hamstring  Physical Therapy Assessment and Plan PT Assessment and Plan Clinical Impression Statement: Continued session focus on improving knee and hip mobilty to improve AROM.  Added quad strengthening exercises to improve extension with gait with therapist facilitation for proper musculature activation.  AROM  improving with 2 degrees lacking full extension.  PT Plan: Initial focus on increasing Rt LE mobility to normalize gait. As mobility improves focus to shift towards strengtheing so patient can return to working in his garden and bear weight while kneeling on Rt knee.     Goals PT Short Term Goals PT Short Term Goal 1: Patient will display improved knee extesnion to > -5 degrees  to increased Rt stride length PT Short Term Goal 1 - Progress: Met PT Short Term Goal 2: Pt be able to fully flex kneeto allow pt to squat down to pick an item off the floor PT Short Term Goal 2 - Progress: Progressing toward goal PT Short Term Goal 3: Pt will state independence with perforamance of HEP PT Short Term Goal 3 - Progress: Met PT Long Term Goals PT Long Term Goal 1: Patient will demosntrate 4/5 MMT for knee extension indicatign improved ability to ambulate up  stairs PT Long Term Goal 1 - Progress: Progressing toward goal PT Long Term Goal 2: Patient will demosntrate 4/5 MMT for knee flexion indicating improved ability to ambulate down stairs PT Long Term Goal 2 - Progress: Progressing toward goal Long Term Goal 3: Patient will dmeosntrate increased glut med/max strength to 4/5 MMt to indicate increased ability to single leg stand >10 5 seconds bilaterally and ambualte without trendelenberg gait.  Long Term Goal 3 Progress: Progressing toward goal Long Term Goal 4: Patient will report being abkle to walk >1 hour without pain >2/10 Long Term Goal 4 Progress: Met PT Long Term Goal 5: patient will demsontrate full knee AROM and 4+/5 MMT glut strength to be able to place 100% weight on knees and stand from the floor to patient can perform regular gardening activities Long Term Goal 5 Progress: Progressing toward goal  Problem List Patient Active Problem List  Diagnosis Date Noted  . OA (osteoarthritis) of knee 10/27/2013  . Back pain 09/23/2013  . Bilateral leg weakness 09/23/2013  . Difficulty in  walking(719.7) 09/23/2013  . Olecranon fracture 09/29/2012  . Draining cutaneous sinus tract 08/24/2012  . Septic joint of left elbow 08/24/2012  . Fracture of ulna, olecranon 08/18/2012  . Elbow locking 02/18/2012  . Postoperative wound breakdown 07/02/2011  . Fibrous non-union 06/03/2011  . Bursitis of elbow 06/03/2011  . Olecranon bursitis 05/06/2011  . JOINT EFFUSION, RIGHT KNEE 06/06/2010  . LOOSE BODY-KNEE 04/09/2010  . OSTEOCHONDRITIS DESSICANS 04/09/2010  . ARTHROSCOPY, RIGHT KNEE, HX OF 04/09/2010  . TEAR MEDIAL MENISCUS 04/04/2010  . TOTAL KNEE FOLLOW-UP 09/29/2008  . OLD DISRUPTION OF POSTERIOR CRUCIATE LIGAMENT 10/14/2007  . DEGENERATIVE JOINT DISEASE, KNEE 02/26/2007  . ENTHESOPATHY, KNEE NEC 01/19/2007    PT - End of Session Activity Tolerance: Patient tolerated treatment well General Behavior During Therapy: [facility] for tasks assessed/performed  GP    Aldona Lento 11/25/2013, 4:07 PM

## 2013-12-01 ENCOUNTER — Ambulatory Visit (HOSPITAL_COMMUNITY)
Admission: RE | Admit: 2013-12-01 | Discharge: 2013-12-01 | Disposition: A | Discharge status: Home / Self Care | Payer: Medicare Other | Source: Ambulatory Visit | Attending: Orthopedic Surgery | Admitting: Orthopedic Surgery

## 2013-12-01 DIAGNOSIS — IMO0001 Reserved for inherently not codable concepts without codable children: Secondary | ICD-10-CM | POA: Diagnosis not present

## 2013-12-01 NOTE — Progress Notes (Signed)
Physical Therapy Treatment Patient Details  Name: Ryan [name] MRN: [id number] Date of Birth: 11-18-1945  Today's Date: 12/01/2013 Time: 0930-1015 PT Time Calculation (min): 45 min Visit#: 6 of 24  Re-eval: 12/14/13 Authorization: medicare  Authorization Visit#: 6 of 10  Charges:  therex 45  Subjective: Symptoms/Limitations Symptoms: Pt states he is walking at home and completing all his exercises.  States he still has some discomfort but no real pain.  Overall getting much better. Pain Assessment Currently in Pain?: No/denies   Exercise/Treatments Stretches Active Hamstring Stretch: 3 reps;30 seconds;Limitations Active Hamstring Stretch Limitations: on 14" box Hip Flexor Stretch: 3 reps;30 seconds Hip Flexor Stretch Limitations: hip flexor and groin stretch to 14" Gastroc Stretch: 3 reps;30 seconds;Limitations Gastroc Stretch Limitations: standing slant board Aerobic Stationary Bike: 8' seat 8 full revolutions Tread Mill: 6 minutes gait training at 2.5mph Standing Heel Raises: Limitations Heel Raises Limitations: Heel walking 2RT Forward Lunges: Both;10 reps;Limitations Forward Lunges Limitations: 4" step Forward Step Up: Right;10 reps;Step Height: 4";Hand Hold: 0 Functional Squat Limitations: squat matrix with UE movments 3# SLS: max of 10 sec Rt LE 5 trials SLS with Vectors: 10 reps Rt LE     Physical Therapy Assessment and Plan PT Assessment and Plan Clinical Impression Statement: Pt is progressing well toward goals.  All STG's now met.  Most difficulty with activity tolerance and Rt LE stability.  PRogressed program to reflect these limitations today.  PT able to negotiate stairs reciprocally without UE assist and kneel onto knees for gardening.  Pt is ambulation at home and completeing all yardwork.  Anticipate early discharge.  PT Plan: focus on strengthening, stabilitiy and activity tolerance.    Goals Home Exercise Program Pt/caregiver will Perform Home  Exercise Program: For increased ROM;For increased strengthening PT Goal: Perform Home Exercise Program - Progress: Met PT Short Term Goals PT Short Term Goal 1: Patient will display improved knee extension to > -5 degrees  to increased Rt stride length PT Short Term Goal 1 - Progress: Met PT Short Term Goal 2: Pt be able to fully flex knee to allow pt to squat down to pick an item off the floor PT Short Term Goal 2 - Progress: Met PT Short Term Goal 3: Pt will state independence with perforamance of HEP PT Short Term Goal 3 - Progress: Met PT Long Term Goals PT Long Term Goal 1: Patient will demonstrate 4/5 MMT for knee extension indicating improved ability to ambulate up  stairs PT Long Term Goal 1 - Progress: Met PT Long Term Goal 2: Patient will demonstrate 4/5 MMT for knee flexion indicating improved ability to ambulate down stairs PT Long Term Goal 2 - Progress: Met Long Term Goal 3: Patient will demonstrate increased glut med/max strength to 4/5 MMt to indicate increased ability to single leg stand >10 5 seconds bilaterally and ambualte without trendelenberg gait.  Long Term Goal 3 Progress: Progressing toward goal Long Term Goal 4: Patient will report being abkle to walk >1 hour without pain >2/10 Long Term Goal 4 Progress: Progressing toward goal PT Long Term Goal 5: patient will demsontrate full knee AROM and 4+/5 MMT glut strength to be able to place 100% weight on knees and stand from the floor to patient can perform regular gardening activities Long Term Goal 5 Progress: Met  Problem List Patient Active Problem List   Diagnosis Date Noted  . OA (osteoarthritis) of knee 10/27/2013  . Back pain 09/23/2013  . Bilateral leg weakness 09/23/2013  .  Difficulty in walking(719.7) 09/23/2013  . Olecranon fracture 09/29/2012  . Draining cutaneous sinus tract 08/24/2012  . Septic joint of left elbow 08/24/2012  . Fracture of ulna, olecranon 08/18/2012  . Elbow locking 02/18/2012  .  Postoperative wound breakdown 07/02/2011  . Fibrous non-union 06/03/2011  . Bursitis of elbow 06/03/2011  . Olecranon bursitis 05/06/2011  . JOINT EFFUSION, RIGHT KNEE 06/06/2010  . LOOSE BODY-KNEE 04/09/2010  . OSTEOCHONDRITIS DESSICANS 04/09/2010  . ARTHROSCOPY, RIGHT KNEE, HX OF 04/09/2010  . TEAR MEDIAL MENISCUS 04/04/2010  . TOTAL KNEE FOLLOW-UP 09/29/2008  . OLD DISRUPTION OF POSTERIOR CRUCIATE LIGAMENT 10/14/2007  . DEGENERATIVE JOINT DISEASE, KNEE 02/26/2007  . ENTHESOPATHY, KNEE NEC 01/19/2007    PT - End of Session Activity Tolerance: Patient tolerated treatment well General Behavior During Therapy: WFL for tasks assessed/performed   Teena Irani, PTA/CLT 12/01/2013, 10:11 AM

## 2013-12-03 ENCOUNTER — Telehealth (HOSPITAL_COMMUNITY): Payer: Self-pay

## 2013-12-03 ENCOUNTER — Ambulatory Visit (HOSPITAL_COMMUNITY)
Admission: RE | Admit: 2013-12-03 | Discharge: 2013-12-03 | Disposition: A | Discharge status: Home / Self Care | Payer: Medicare Other | Source: Ambulatory Visit | Attending: Internal Medicine | Admitting: Internal Medicine

## 2013-12-03 DIAGNOSIS — IMO0001 Reserved for inherently not codable concepts without codable children: Secondary | ICD-10-CM | POA: Diagnosis not present
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
## 2013-12-07 ENCOUNTER — Ambulatory Visit (HOSPITAL_COMMUNITY): Payer: Medicare Other

## 2013-12-09 ENCOUNTER — Ambulatory Visit (HOSPITAL_COMMUNITY): Payer: Medicare Other

## 2013-12-13 ENCOUNTER — Ambulatory Visit (INDEPENDENT_AMBULATORY_CARE_PROVIDER_SITE_OTHER): Payer: Self-pay | Admitting: Orthopedic Surgery

## 2013-12-13 ENCOUNTER — Encounter: Payer: Self-pay | Admitting: Orthopedic Surgery

## 2013-12-13 VITALS — BP 126/83 | Ht 68.0 in | Wt 179.4 lb

## 2013-12-13 DIAGNOSIS — Z96659 Presence of unspecified artificial knee joint: Secondary | ICD-10-CM

## 2013-12-13 DIAGNOSIS — Z96651 Presence of right artificial knee joint: Secondary | ICD-10-CM

## 2013-12-13 NOTE — Progress Notes (Signed)
Postop visit right total knee date of surgery 10/27/2013 doing well height 58 weight 179 blood pressure 126/83 patient has no complaints range of motion 105. Formal therapy is finished he is doing therapy at home is walking unsupported his knee looks good there is no swelling no effusion his incision healed he has no calf tenderness  Followup 6 weeks routine followup check

## 2013-12-14 ENCOUNTER — Ambulatory Visit (HOSPITAL_COMMUNITY): Payer: Medicare Other | Admitting: Physical Therapy

## 2013-12-16 ENCOUNTER — Ambulatory Visit (HOSPITAL_COMMUNITY): Payer: Medicare Other | Admitting: Physical Therapy

## 2013-12-21 ENCOUNTER — Ambulatory Visit (HOSPITAL_COMMUNITY): Payer: Medicare Other

## 2013-12-23 ENCOUNTER — Ambulatory Visit (HOSPITAL_COMMUNITY): Payer: Medicare Other | Admitting: Physical Therapy

## 2014-01-21 ENCOUNTER — Encounter (HOSPITAL_COMMUNITY): Payer: Self-pay | Admitting: Emergency Medicine

## 2014-01-21 ENCOUNTER — Emergency Department (HOSPITAL_COMMUNITY)
Admission: EM | Admit: 2014-01-21 | Discharge: 2014-01-21 | Disposition: A | Discharge status: Home / Self Care | Payer: Medicare Other | Attending: Emergency Medicine | Admitting: Emergency Medicine

## 2014-01-21 ENCOUNTER — Emergency Department (HOSPITAL_COMMUNITY): Payer: Medicare Other

## 2014-01-21 ENCOUNTER — Telehealth: Payer: Self-pay | Admitting: Orthopedic Surgery

## 2014-01-21 DIAGNOSIS — M25422 Effusion, left elbow: Secondary | ICD-10-CM

## 2014-01-21 DIAGNOSIS — I10 Essential (primary) hypertension: Secondary | ICD-10-CM | POA: Diagnosis not present

## 2014-01-21 DIAGNOSIS — R609 Edema, unspecified: Secondary | ICD-10-CM

## 2014-01-21 DIAGNOSIS — Z87828 Personal history of other (healed) physical injury and trauma: Secondary | ICD-10-CM | POA: Insufficient documentation

## 2014-01-21 DIAGNOSIS — Z720 Tobacco use: Secondary | ICD-10-CM | POA: Insufficient documentation

## 2014-01-21 MED ORDER — SULFAMETHOXAZOLE-TMP DS 800-160 MG PO TABS: 1.0000 | ORAL_TABLET | Freq: Two times a day (BID) | ORAL | Status: DC
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
## 2014-01-21 NOTE — ED Notes (Signed)
Dr. Romeo AppleHarrison here to see patient.

## 2014-01-21 NOTE — ED Notes (Signed)
Pt reports left elbow pain and "leaking" since this am. Pt reports has had surgery on left elbow several times. No new or recent injury. Pt denies any fevers,nausea.

## 2014-01-21 NOTE — Discharge Instructions (Signed)
Change the bandage as discussed

## 2014-01-21 NOTE — Telephone Encounter (Signed)
Patient walked in to office today, Friday, 01/21/14, relaying a problem with his left elbow, for which he has undergone treatment including surgery -- states the elbow "broke open", and is draining.  This was evident as well.  We relayed to patient that Dr Romeo AppleHarrison is in surgery today; also, no nurse or clinical staff here today; therefore, advised to go directly to the Emergency Room. States he will proceed there now.

## 2014-01-21 NOTE — ED Notes (Signed)
Dressing to left elbow per order.

## 2014-01-22 NOTE — ED Provider Notes (Signed)
Medical screening examination/treatment/procedure(s) were conducted as a shared visit with non-physician practitioner(s) and myself.  I personally evaluated the patient during the encounter.   EKG Interpretation None     Oozing from left elbow.Cathlean Sauer.  X-ray shows large joint effusion. Will consult orthopedics.  Donnetta HutchingBrian Nakaila Freeze, MD 01/22/14 [phone]

## 2014-01-24 ENCOUNTER — Other Ambulatory Visit: Payer: Self-pay | Admitting: Orthopedic Surgery

## 2014-01-24 ENCOUNTER — Encounter (HOSPITAL_COMMUNITY): Payer: Self-pay | Admitting: Pharmacy Technician

## 2014-01-24 NOTE — Patient Instructions (Signed)
Ryan [name]  01/24/2014   Your procedure is scheduled on:  01/28/2014  Report to [facility] at  720  AM.  Call this number if you have problems the morning of surgery: [phone]   Remember:   Do not eat food or drink liquids after midnight.   Take these medicines the morning of surgery with A SIP OF WATER: microzide   Do not wear jewelry, make-up or nail polish.  Do not wear lotions, powders, or perfumes.   Do not shave 48 hours prior to surgery. Men may shave face and neck.  Do not bring valuables to the hospital.  [facility] is not responsible for any belongings or valuables.               Contacts, dentures or bridgework may not be worn into surgery.  Leave suitcase in the car. After surgery it may be brought to your room.  For patients admitted to the hospital, discharge time is determined by your treatment team.               Patients discharged the day of surgery will not be allowed to drive home.  Name and phone number of your driver: family  Special Instructions: Shower using CHG 2 nights before surgery and the night before surgery.  If you shower the day of surgery use CHG.  Use special wash - you have one bottle of CHG for all showers.  You should use approximately 1/3 of the bottle for each shower.   Please read over the following fact sheets that you were given: Pain Booklet, Coughing and Deep Breathing, Surgical Site Infection Prevention, Anesthesia Post-op Instructions and Care and Recovery After Surgery Elbow Fracture with Open Reduction and Internal Fixation (ORIF) A fracture (break in bone) of the elbow means one of the bones that comprises the elbow is broken. If fractures are not displaced (separated), they may be treated conservatively. That means that only a sling or splint may be required for two to three weeks. Often, elbow fractures are treated by early range of motion exercises to prevent the elbow from getting stiff. If fractures are large and not  stable, an operation may be required to put the bones back into proper position and hold them in place with:  Pins.  Plates.  Screws. This is called open reduction and internal fixation (ORIF). The main goal of treating fractured elbows is to get the bones back into position and keep them in place. This goal gives the best chance of an elbow that:  Works as normally as possible.  Has the optimum range of motion. DIAGNOSIS  The diagnosis of a fractured elbow is made by x-ray. These will be required before and after the elbow is fixed. RISKS AND COMPLICATIONS All surgery is associated with risks. Some of these risks are:  Excessive bleeding.  Failure to heal properly (non-union).  Infection.  Stiffness of elbow following injury.  Damage to one of the nerves around the elbow producing numbness or weakness. LET YOUR CAREGIVER KNOW ABOUT:  Allergies.  Medications taken including herbs, eye drops, over the counter medications, and creams.  Use of steroids (by mouth or creams).  Previous problems with anesthetics or novocaine.  Any numbness or tingling in your hand/forearm.  Possibility of pregnancy, if this applies.  History of blood clots (thrombophlebitis).  History of bleeding or blood problems.  Previous surgery.  Other health problems.  Family history of anesthetic problems PROCEDURE  You will be given an anesthetic which will keep you pain free during surgery. This will be accomplished by a general anesthetic (you go to sleep) or regional anesthesia (your arm is made numb). After the surgery you will be taken to the recovery area where a nurse will monitor your progress. When you are stable, taking fluids well and provided there are no complications, you will be allowed to return to your hospital room or possibly even go home. AFTER THE PROCEDURE   Only take over-the-counter or prescription medicines for pain, discomfort, or fever as directed by your  caregiver.  You may use ice for 15-20 minutes, 03-04 times per day, for the first 2 to 3 days.  Change dressings and see your caregiver as directed.  Your caregiver will instruct you when to begin using and exercising your arm and elbow. SEEK IMMEDIATE MEDICAL CARE IF:  There is redness, swelling, or increasing pain in the surgical area.  Pus, blood or unusual drainage is coming from the area or is visible on the dressings or cast.  An unexplained oral temperature above 102 F (38.9 C) develops.  You notice a foul smell coming from the surgical site or dressing.  The wound breaks open (edges are not staying together) after stitches have been removed.  You develop increasing pain or increasing pain with motion of your fingers.  There is numbness or tingling in your hand or forearm.  You have any other questions or concerns following surgery. Document Released: 09/04/2000 Document Revised: 06/03/2011 Document Reviewed: 03/28/2008 [facility] Patient Information 2015 [employer]. This information is not intended to replace advice given to you by your health care provider. Make sure you discuss any questions you have with your health care provider. PATIENT INSTRUCTIONS POST-ANESTHESIA  IMMEDIATELY FOLLOWING SURGERY:  Do not drive or operate machinery for the first twenty four hours after surgery.  Do not make any important decisions for twenty four hours after surgery or while taking narcotic pain medications or sedatives.  If you develop intractable nausea and vomiting or a severe headache please notify your doctor immediately.  FOLLOW-UP:  Please make an appointment with your surgeon as instructed. You do not need to follow up with anesthesia unless specifically instructed to do so.  WOUND CARE INSTRUCTIONS (if applicable):  Keep a dry clean dressing on the anesthesia/puncture wound site if there is drainage.  Once the wound has quit draining you may leave it open to air.  Generally  you should leave the bandage intact for twenty four hours unless there is drainage.  If the epidural site drains for more than 36-48 hours please call the anesthesia department.  QUESTIONS?:  Please feel free to call your physician or the hospital operator if you have any questions, and they will be happy to assist you.

## 2014-01-25 ENCOUNTER — Ambulatory Visit (INDEPENDENT_AMBULATORY_CARE_PROVIDER_SITE_OTHER): Payer: Medicare Other | Admitting: Orthopedic Surgery

## 2014-01-25 ENCOUNTER — Other Ambulatory Visit: Payer: Self-pay | Admitting: *Deleted

## 2014-01-25 ENCOUNTER — Encounter: Payer: Self-pay | Admitting: Orthopedic Surgery

## 2014-01-25 ENCOUNTER — Encounter (HOSPITAL_COMMUNITY): Payer: Self-pay

## 2014-01-25 ENCOUNTER — Encounter (HOSPITAL_COMMUNITY)
Admission: RE | Admit: 2014-01-25 | Discharge: 2014-01-25 | Disposition: A | Discharge status: Home / Self Care | Payer: Medicare Other | Source: Ambulatory Visit | Attending: Orthopedic Surgery | Admitting: Orthopedic Surgery

## 2014-01-25 ENCOUNTER — Telehealth: Payer: Self-pay | Admitting: Orthopedic Surgery

## 2014-01-25 VITALS — BP 100/51 | Ht 68.0 in | Wt 178.0 lb

## 2014-01-25 DIAGNOSIS — Z96651 Presence of right artificial knee joint: Secondary | ICD-10-CM

## 2014-01-25 DIAGNOSIS — I10 Essential (primary) hypertension: Secondary | ICD-10-CM | POA: Diagnosis not present

## 2014-01-25 DIAGNOSIS — L988 Other specified disorders of the skin and subcutaneous tissue: Secondary | ICD-10-CM

## 2014-01-25 DIAGNOSIS — S52022K Displaced fracture of olecranon process without intraarticular extension of left ulna, subsequent encounter for closed fracture with nonunion: Secondary | ICD-10-CM | POA: Diagnosis not present

## 2014-01-25 DIAGNOSIS — Z79899 Other long term (current) drug therapy: Secondary | ICD-10-CM | POA: Diagnosis not present

## 2014-01-25 DIAGNOSIS — S52022S Displaced fracture of olecranon process without intraarticular extension of left ulna, sequela: Secondary | ICD-10-CM

## 2014-01-25 DIAGNOSIS — F17210 Nicotine dependence, cigarettes, uncomplicated: Secondary | ICD-10-CM | POA: Diagnosis not present

## 2014-01-25 DIAGNOSIS — L089 Local infection of the skin and subcutaneous tissue, unspecified: Secondary | ICD-10-CM

## 2014-01-25 LAB — BASIC METABOLIC PANEL
ANION GAP: 12 (ref 5–15)
BUN: 20 mg/dL (ref 6–23)
CHLORIDE: 97 meq/L (ref 96–112)
CO2: 26 mEq/L (ref 19–32)
Calcium: 9.4 mg/dL (ref 8.4–10.5)
Creatinine, Ser: 1.71 mg/dL — ABNORMAL HIGH (ref 0.50–1.35)
GFR, EST AFRICAN AMERICAN: 46 mL/min — AB (ref >90)
GFR, EST NON AFRICAN AMERICAN: 40 mL/min — AB (ref >90)
Glucose, Bld: 136 mg/dL — ABNORMAL HIGH (ref 70–99)
POTASSIUM: 5.1 meq/L (ref 3.7–5.3)
Sodium: 135 mEq/L — ABNORMAL LOW (ref 137–147)

## 2014-01-25 LAB — SURGICAL PCR SCREEN
MRSA, PCR: POSITIVE — AB
Staphylococcus aureus: POSITIVE — AB

## 2014-01-25 LAB — HEMOGLOBIN AND HEMATOCRIT, BLOOD
HEMATOCRIT: 41.4 % (ref 39.0–52.0)
HEMOGLOBIN: 14 g/dL (ref 13.0–17.0)

## 2014-01-25 MED ORDER — MUPIROCIN 2 % EX OINT
TOPICAL_OINTMENT | CUTANEOUS | Status: AC
  Filled 2014-01-25: qty 22

## 2014-01-25 NOTE — Pre-Procedure Instructions (Signed)
Order for obtain consent not in computer on chart check done 01/24/2014 at 1400. Called office and spoke with Steward DroneBrenda, who took message for this to be done prior to patients arrival on 01/25/2014 at 0800. Upon arrival for PAT, obtain consent order still not entered. Called office and spoke with Geoffery SpruceLeann, who also took message for this. Patient arrived at PAT at 0745 and departed at [id number] and order still not in. Office notified again. Will sign consent AM of surgery.

## 2014-01-25 NOTE — Pre-Procedure Instructions (Signed)
Patient given information to sign up for my chart at home. 

## 2014-01-25 NOTE — Telephone Encounter (Signed)
Regarding surgery scheduled at [facility] 01/28/14 for left elbow,OTIF, no pre-authorization required for surgery is required per insurer, Medicare, guidelines.

## 2014-01-25 NOTE — Progress Notes (Signed)
Patient ID: Ryan FarrStuart D [name], male   DOB: [birth year], 68 y.o.   MRN: [id number] Chief Complaint  Patient presents with  . Follow-up    6 week recheck, Right TKA, DOS 10/27/13    Encounter Diagnoses  Name Primary?  . History of total knee arthroplasty, right Yes  . Draining cutaneous sinus tract   . Olecranon fracture, left, sequela     Today the patient came in for evaluation of his right knee however he has develops draining sinus tract of his left elbow from previous open treatment internal fixation with nonunion. He will require plate removal, wound debridement, internal fixation.  He was preop'd today for that.  His right knee is doing well, he has flexion of 115 with slight flexion contracture less than 5 exam bleeding without assistive device and no pain or instability  Follow-up Monday for postop visit for left elbow  The history and physical related to the left elbow will be incorporated by reference will be dictated at a later time.
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
## 2014-01-28 ENCOUNTER — Ambulatory Visit (HOSPITAL_COMMUNITY): Payer: Medicare Other

## 2014-01-28 ENCOUNTER — Ambulatory Visit (HOSPITAL_COMMUNITY): Payer: Medicare Other | Admitting: Anesthesiology

## 2014-01-28 ENCOUNTER — Ambulatory Visit (HOSPITAL_COMMUNITY)
Admission: RE | Admit: 2014-01-28 | Discharge: 2014-01-28 | Disposition: A | Discharge status: Home / Self Care | Payer: Medicare Other | Source: Ambulatory Visit | Attending: Orthopedic Surgery | Admitting: Orthopedic Surgery

## 2014-01-28 ENCOUNTER — Encounter (HOSPITAL_COMMUNITY): Payer: Self-pay | Admitting: *Deleted

## 2014-01-28 ENCOUNTER — Encounter (HOSPITAL_COMMUNITY)
Admission: RE | Disposition: A | Discharge status: Home / Self Care | Payer: Self-pay | Source: Ambulatory Visit | Attending: Orthopedic Surgery

## 2014-01-28 DIAGNOSIS — S52022M Displaced fracture of olecranon process without intraarticular extension of left ulna, subsequent encounter for open fracture type I or II with nonunion: Secondary | ICD-10-CM

## 2014-01-28 DIAGNOSIS — F17210 Nicotine dependence, cigarettes, uncomplicated: Secondary | ICD-10-CM | POA: Diagnosis not present

## 2014-01-28 DIAGNOSIS — S52022K Displaced fracture of olecranon process without intraarticular extension of left ulna, subsequent encounter for closed fracture with nonunion: Secondary | ICD-10-CM | POA: Diagnosis not present

## 2014-01-28 DIAGNOSIS — I10 Essential (primary) hypertension: Secondary | ICD-10-CM | POA: Insufficient documentation

## 2014-01-28 DIAGNOSIS — Z79899 Other long term (current) drug therapy: Secondary | ICD-10-CM | POA: Diagnosis not present

## 2014-01-28 HISTORY — PX: ORIF ELBOW FRACTURE: SHX5031

## 2014-01-28 SURGERY — OPEN REDUCTION INTERNAL FIXATION (ORIF) ELBOW/OLECRANON FRACTURE
Anesthesia: General | Site: Elbow | Laterality: Left

## 2014-01-28 MED ORDER — SULFAMETHOXAZOLE-TRIMETHOPRIM 800-160 MG PO TABS: 1.0000 | ORAL_TABLET | Freq: Two times a day (BID) | ORAL | Status: DC

## 2014-01-28 MED ORDER — FENTANYL CITRATE 0.05 MG/ML IJ SOLN
INTRAMUSCULAR | Status: DC | PRN
  Administered 2014-01-28: 50 ug via INTRAVENOUS
  Administered 2014-01-28: 25 ug via INTRAVENOUS
  Administered 2014-01-28: 50 ug via INTRAVENOUS
  Administered 2014-01-28 (×2): 25 ug via INTRAVENOUS

## 2014-01-28 MED ORDER — PROPOFOL 10 MG/ML IV BOLUS
INTRAVENOUS | Status: AC
  Filled 2014-01-28: qty 20

## 2014-01-28 MED ORDER — SUCCINYLCHOLINE CHLORIDE 20 MG/ML IJ SOLN
INTRAMUSCULAR | Status: AC
  Filled 2014-01-28: qty 1

## 2014-01-28 MED ORDER — ONDANSETRON HCL 4 MG/2ML IJ SOLN
INTRAMUSCULAR | Status: AC
  Filled 2014-01-28: qty 2

## 2014-01-28 MED ORDER — FENTANYL CITRATE 0.05 MG/ML IJ SOLN
INTRAMUSCULAR | Status: AC
  Filled 2014-01-28: qty 5

## 2014-01-28 MED ORDER — SUCCINYLCHOLINE CHLORIDE 20 MG/ML IJ SOLN
INTRAMUSCULAR | Status: DC | PRN
  Administered 2014-01-28: 100 mg via INTRAVENOUS

## 2014-01-28 MED ORDER — SODIUM CHLORIDE 0.9 % IR SOLN
Status: DC | PRN
  Administered 2014-01-28 (×2): 1000 mL

## 2014-01-28 MED ORDER — LIDOCAINE HCL 1 % IJ SOLN
INTRAMUSCULAR | Status: DC | PRN
  Administered 2014-01-28: 30 mg via INTRADERMAL

## 2014-01-28 MED ORDER — CEFAZOLIN SODIUM-DEXTROSE 2-3 GM-% IV SOLR
2.0000 g | INTRAVENOUS | Status: AC
  Administered 2014-01-28: 2 g via INTRAVENOUS

## 2014-01-28 MED ORDER — CHLORHEXIDINE GLUCONATE 4 % EX LIQD: 60.0000 mL | Freq: Once | CUTANEOUS | Status: DC

## 2014-01-28 MED ORDER — FENTANYL CITRATE 0.05 MG/ML IJ SOLN
25.0000 ug | INTRAMUSCULAR | Status: DC | PRN
  Administered 2014-01-28 (×4): 50 ug via INTRAVENOUS
  Filled 2014-01-28 (×2): qty 2

## 2014-01-28 MED ORDER — LACTATED RINGERS IV SOLN
INTRAVENOUS | Status: DC | PRN
  Administered 2014-01-28 (×2): via INTRAVENOUS

## 2014-01-28 MED ORDER — MIDAZOLAM HCL 2 MG/2ML IJ SOLN
INTRAMUSCULAR | Status: AC
  Filled 2014-01-28: qty 2

## 2014-01-28 MED ORDER — ONDANSETRON HCL 4 MG/2ML IJ SOLN
4.0000 mg | Freq: Once | INTRAMUSCULAR | Status: AC
  Administered 2014-01-28: 4 mg via INTRAVENOUS

## 2014-01-28 MED ORDER — EPHEDRINE SULFATE 50 MG/ML IJ SOLN
INTRAMUSCULAR | Status: DC | PRN
  Administered 2014-01-28 (×3): 5 mg via INTRAVENOUS

## 2014-01-28 MED ORDER — ROCURONIUM BROMIDE 100 MG/10ML IV SOLN
INTRAVENOUS | Status: DC | PRN
  Administered 2014-01-28: 5 mg via INTRAVENOUS

## 2014-01-28 MED ORDER — BUPIVACAINE-EPINEPHRINE (PF) 0.5% -1:200000 IJ SOLN
INTRAMUSCULAR | Status: AC
  Filled 2014-01-28: qty 60

## 2014-01-28 MED ORDER — ROCURONIUM BROMIDE 50 MG/5ML IV SOLN
INTRAVENOUS | Status: AC
  Filled 2014-01-28: qty 1

## 2014-01-28 MED ORDER — LIDOCAINE HCL (PF) 1 % IJ SOLN
INTRAMUSCULAR | Status: AC
  Filled 2014-01-28: qty 5

## 2014-01-28 MED ORDER — CEFAZOLIN SODIUM-DEXTROSE 2-3 GM-% IV SOLR
INTRAVENOUS | Status: AC
  Filled 2014-01-28: qty 50

## 2014-01-28 MED ORDER — LACTATED RINGERS IV SOLN
INTRAVENOUS | Status: DC
  Administered 2014-01-28: 08:00:00 via INTRAVENOUS

## 2014-01-28 MED ORDER — BUPIVACAINE-EPINEPHRINE (PF) 0.5% -1:200000 IJ SOLN
INTRAMUSCULAR | Status: DC | PRN
  Administered 2014-01-28: 30 mL

## 2014-01-28 MED ORDER — HYDROMORPHONE HCL 4 MG PO TABS: 4.0000 mg | ORAL_TABLET | Freq: Two times a day (BID) | ORAL | Status: DC | PRN

## 2014-01-28 MED ORDER — PROMETHAZINE HCL 12.5 MG PO TABS: 12.5000 mg | ORAL_TABLET | Freq: Four times a day (QID) | ORAL | Status: DC | PRN

## 2014-01-28 MED ORDER — MIDAZOLAM HCL 2 MG/2ML IJ SOLN
1.0000 mg | INTRAMUSCULAR | Status: DC | PRN
  Administered 2014-01-28: 2 mg via INTRAVENOUS

## 2014-01-28 MED ORDER — PROPOFOL 10 MG/ML IV BOLUS
INTRAVENOUS | Status: DC | PRN
  Administered 2014-01-28: 20 mg via INTRAVENOUS
  Administered 2014-01-28: 100 mg via INTRAVENOUS

## 2014-01-28 MED ORDER — ONDANSETRON HCL 4 MG/2ML IJ SOLN
4.0000 mg | Freq: Once | INTRAMUSCULAR | Status: AC | PRN
  Administered 2014-01-28: 4 mg via INTRAVENOUS
  Filled 2014-01-28: qty 2

## 2014-01-28 SURGICAL SUPPLY — 71 items
BAG HAMPER (MISCELLANEOUS) ×3 IMPLANT
BANDAGE ELASTIC 4 VELCRO NS (GAUZE/BANDAGES/DRESSINGS) ×8 IMPLANT
BANDAGE ESMARK 4X12 BL STRL LF (DISPOSABLE) ×1 IMPLANT
BIT DRILL 2.0X128 (BIT) ×1 IMPLANT
BIT DRILL 2.0X128MM (BIT) ×1
BLADE 10 SAFETY STRL DISP (BLADE) ×2 IMPLANT
BLADE SURG 15 STRL LF DISP TIS (BLADE) IMPLANT
BLADE SURG 15 STRL SS (BLADE) ×3
BLADE SURG SZ10 CARB STEEL (BLADE) ×2 IMPLANT
BNDG CMPR 12X4 ELC STRL LF (DISPOSABLE) ×1
BNDG COHESIVE 4X5 TAN STRL (GAUZE/BANDAGES/DRESSINGS) ×2 IMPLANT
BNDG ESMARK 4X12 BLUE STRL LF (DISPOSABLE) ×3
CAP PIN PROTECTOR ORTHO WHT (CAP) IMPLANT
CHLORAPREP W/TINT 26ML (MISCELLANEOUS) ×1 IMPLANT
CLOTH BEACON ORANGE TIMEOUT ST (SAFETY) ×3 IMPLANT
COVER LIGHT HANDLE STERIS (MISCELLANEOUS) ×6 IMPLANT
COVER MAYO STAND XLG (DRAPE) ×2 IMPLANT
CUFF TOURNIQUET SINGLE 18IN (TOURNIQUET CUFF) ×1 IMPLANT
DECANTER SPIKE VIAL GLASS SM (MISCELLANEOUS) ×3 IMPLANT
DRAIN TROCAR  MED 1/8 (DRAIN) ×2 IMPLANT
DRAPE C-ARM FOLDED MOBILE STRL (DRAPES) ×3 IMPLANT
DRAPE PROXIMA HALF (DRAPES) ×2 IMPLANT
DRSG MEPILEX BORDER 4X12 (GAUZE/BANDAGES/DRESSINGS) ×1 IMPLANT
ELECT REM PT RETURN 9FT ADLT (ELECTROSURGICAL) ×3
ELECTRODE REM PT RTRN 9FT ADLT (ELECTROSURGICAL) ×1 IMPLANT
GAUZE XEROFORM 5X9 LF (GAUZE/BANDAGES/DRESSINGS) ×2 IMPLANT
GLOVE BIO SURGEON STRL SZ 6.5 (GLOVE) ×1 IMPLANT
GLOVE BIO SURGEONS STRL SZ 6.5 (GLOVE) ×1
GLOVE BIOGEL PI IND STRL 7.0 (GLOVE) IMPLANT
GLOVE BIOGEL PI IND STRL 7.5 (GLOVE) IMPLANT
GLOVE BIOGEL PI INDICATOR 7.0 (GLOVE) ×6
GLOVE BIOGEL PI INDICATOR 7.5 (GLOVE) ×2
GLOVE ECLIPSE 6.5 STRL STRAW (GLOVE) ×2 IMPLANT
GLOVE SKINSENSE NS SZ8.0 LF (GLOVE) ×2
GLOVE SKINSENSE STRL SZ8.0 LF (GLOVE) ×1 IMPLANT
GLOVE SS N UNI LF 8.5 STRL (GLOVE) ×3 IMPLANT
GOWN STRL REUS W/TWL LRG LVL3 (GOWN DISPOSABLE) ×12 IMPLANT
GOWN STRL REUS W/TWL XL LVL3 (GOWN DISPOSABLE) ×3 IMPLANT
INST SET MINOR BONE (KITS) ×3 IMPLANT
KIT ROOM TURNOVER APOR (KITS) ×3 IMPLANT
MANIFOLD NEPTUNE II (INSTRUMENTS) ×3 IMPLANT
NDL HYPO 21X1.5 SAFETY (NEEDLE) ×1 IMPLANT
NEEDLE HYPO 21X1.5 SAFETY (NEEDLE) ×3 IMPLANT
NS IRRIG 1000ML POUR BTL (IV SOLUTION) ×3 IMPLANT
PACK BASIC LIMB (CUSTOM PROCEDURE TRAY) ×3 IMPLANT
PAD ABD 5X9 TENDERSORB (GAUZE/BANDAGES/DRESSINGS) ×2 IMPLANT
PAD ARMBOARD 7.5X6 YLW CONV (MISCELLANEOUS) ×3 IMPLANT
PADDING WEBRIL 4 STERILE (GAUZE/BANDAGES/DRESSINGS) ×2 IMPLANT
PASSER SUT SWANSON 36MM LOOP (INSTRUMENTS) ×2 IMPLANT
PIN CAPS ORTHO GREEN .062 (PIN) IMPLANT
Polypropylene Button ×1 IMPLANT
SET BASIN LINEN APH (SET/KITS/TRAYS/PACK) ×3 IMPLANT
SLING ARM LRG ADULT FOAM STRAP (SOFTGOODS) ×2 IMPLANT
SPLINT J IMMOBILIZER 4X20FT (CAST SUPPLIES) IMPLANT
SPLINT J PLASTER J 4INX20Y (CAST SUPPLIES) ×2
SPONGE DRAIN TRACH 4X4 STRL 2S (GAUZE/BANDAGES/DRESSINGS) ×2 IMPLANT
SPONGE GAUZE 4X4 12PLY (GAUZE/BANDAGES/DRESSINGS) ×2 IMPLANT
SPONGE LAP 18X18 X RAY DECT (DISPOSABLE) ×5 IMPLANT
STAPLER VISISTAT 35W (STAPLE) ×3 IMPLANT
SUT ETHIBOND 5 LR DA (SUTURE) ×2 IMPLANT
SUT LASSO 45 DEGREE (SUTURE) ×2 IMPLANT
SUT MON AB 0 CT1 (SUTURE) ×2 IMPLANT
SUT MON AB 2-0 SH 27 (SUTURE) ×6
SUT MON AB 2-0 SH27 (SUTURE) IMPLANT
SUT PROLENE 3 0 PS 1 (SUTURE) IMPLANT
SUT WIRE 4-0 14IN SS (SUTURE) ×2 IMPLANT
SWAB CULTURE LIQ STUART DBL (MISCELLANEOUS) ×2 IMPLANT
SYR 30ML LL (SYRINGE) ×3 IMPLANT
SYR BULB IRRIGATION 50ML (SYRINGE) ×5 IMPLANT
TAPE UMBILICAL COTTON 1/8X30 (MISCELLANEOUS) ×4 IMPLANT
TOWEL OR 17X26 4PK STRL BLUE (TOWEL DISPOSABLE) ×2 IMPLANT

## 2014-01-28 NOTE — Brief Op Note (Addendum)
01/28/2014  2:37 PM  PATIENT:  Ryan [name]  68 y.o. male  PRE-OPERATIVE DIAGNOSIS:  non union olecranon  POST-OPERATIVE DIAGNOSIS:  non union olecranon  PROCEDURE:  Procedure(s): TRICEPS ADVANCEMENT AND HARDWARE REMOVAL LEFT ELBOW (Left)   Operative findings direct medication was noted between the skin olecranon nonunion and the elbow joint with synovial fluid draining through the wound.  SURGEON:  Surgeon(s) and Role:    * Stanley E Harrison, MD - Primary  PHYSICIAN ASSISTANT:   ASSISTANTS: Debbie Dallas   ANESTHESIA:   general  EBL:  Total I/O In: 1420 [P.O.:220; I.V.:1200] Out: 100 [Blood:100]  BLOOD ADMINISTERED:none  DRAINS: 1 Hemovac in the subcutaneous tissue   LOCAL MEDICATIONS USED:  MARCAINE     SPECIMEN:  Source of Specimen:  cultures were taken anaerobic and aerobic left elbow  DISPOSITION OF SPECIMEN:  microbiology  COUNTS:  YES  TOURNIQUET:  * No tourniquets in log *  DICTATION: .Dragon Dictation  PLAN OF CARE: Discharge to home after PACU  PATIENT DISPOSITION:  PACU - hemodynamically stable.   Delay start of Pharmacological VTE agent (>24hrs) due to surgical blood loss or risk of bleeding: not applicable  Details of procedure 2 markers were used to identify the patient as Ryan [name]. This chart was reviewed and the surgical site was confirmed as left elbow.  Marking was performed.  Twisting to the operating room for general anesthesia. Antibiotics were started. This placed in the supine position without a tourniquet.  After sterile prep with Betadine and draping was done sterilely timeout was completed  A longitudinal incision was made using the previously used incision. Skin edges were debrided sharply with a drainage was occurring. After mobilizing the skin flaps, it was noted that the patient had a connection between the joint with a 1-1/2-2 cm opening between the fascia and triceps tendon. The plate was intact although the olecranon  fracture fragment had displaced proximally.  I made the decision at this time to ignore the olecranon fracture fragment which had not healed after several attempts. We did a triceps advancement instead. We used cotton umbilical tape we through the triceps tendon and then placed drill holes in the olecranon after passing this in place and the elbow in extension we tied the Decadron over a button. Range of motion at this point was flexion 120 extension out to 20. I then took a second Dacron tape we did through the tendon as well and then tied it in continuity with the fascia around the remaining portions of the ulna.  This essentially closed the communication between the joint and the skin and bypass the olecranon fracture fragment.  The wound was thoroughly irrigated and a subcutaneous drain was placed  The wound was closed with 2-0 Monocryl and staples. Sterile dressings were applied followed by Posterior splint was applied with the elbow at 70 flexion.  He was discharged on by mouth antibiotics pending culture 

## 2014-01-28 NOTE — Anesthesia Procedure Notes (Signed)
Procedure Name: Intubation Date/Time: 01/28/2014 7:50 AM Performed by: Glynn OctaveANIEL, [name]: Patient identified, Patient being monitored, Timeout performed, Emergency Drugs available and Suction available Patient Re-evaluated:Patient Re-evaluated prior to inductionOxygen Delivery Method: Circle System Utilized Preoxygenation: Pre-oxygenation with 100% oxygen Intubation Type: IV induction Ventilation: Mask ventilation without difficulty Laryngoscope Size: Mac and 3 Grade View: Grade II Tube type: Oral Tube size: 7.0 mm Number of attempts: 1 Airway Equipment and Method: stylet Placement Confirmation: ETT inserted through vocal cords under direct vision,  positive ETCO2 and breath sounds checked- equal and bilateral Secured at: 22 cm Tube secured with: Tape Dental Injury: Teeth and Oropharynx as per pre-operative assessment

## 2014-01-28 NOTE — Interval H&P Note (Signed)
History and Physical Interval Note:  01/28/2014 7:26 AM  Ryan [name]  has presented today for surgery, with the diagnosis of non union olecranon  The various methods of treatment have been discussed with the patient and family. After consideration of risks, benefits and other options for treatment, the patient has consented to  Procedure(s): OPEN REDUCTION INTERNAL FIXATION (ORIF) ELBOW/OLECRANON FRACTURE (Left) as a surgical intervention .  The patient's history has been reviewed, patient examined, no change in status, stable for surgery.  I have reviewed the patient's chart and labs.  Questions were answered to the patient's satisfaction.     Fuller CanadaStanley Harrison

## 2014-01-28 NOTE — Transfer of Care (Signed)
Immediate Anesthesia Transfer of Care Note  Patient: Ryan [name]  Procedure(s) Performed: Procedure(s): TRICEPS ADVANCEMENT AND HARDWARE REMOVAL LEFT ELBOW (Left)  Patient Location: PACU  Anesthesia Type:General  Level of Consciousness: awake, alert  and oriented  Airway & Oxygen Therapy: Patient Spontanous Breathing and Patient connected to face mask oxygen  Post-op Assessment: Report given to PACU RN  Post vital signs: Reviewed  Complications: No apparent anesthesia complications

## 2014-01-28 NOTE — Anesthesia Preprocedure Evaluation (Signed)
Anesthesia Evaluation  Patient identified by MRN, date of birth, ID band Patient awake    Reviewed: Allergy & Precautions, H&P , NPO status , Patient's Chart, lab work & pertinent test results  Airway Mallampati: II  TM Distance: >3 FB Neck ROM: Full    Dental  (+) Edentulous Upper   Pulmonary Current Smoker,    Pulmonary exam normal       Cardiovascular hypertension, Pt. on medications negative cardio ROS  Rhythm:Regular Rate:Normal     Neuro/Psych negative neurological ROS  negative psych ROS   GI/Hepatic negative GI ROS, Neg liver ROS,   Endo/Other  negative endocrine ROS  Renal/GU negative Renal ROS  negative genitourinary   Musculoskeletal   Abdominal Normal abdominal exam  (+)   Peds  Hematology negative hematology ROS (+)   Anesthesia Other Findings   Reproductive/Obstetrics                             Anesthesia Physical Anesthesia Plan  ASA: II  Anesthesia Plan: General   Post-op Pain Management:    Induction: Intravenous  Airway Management Planned: Oral ETT  Additional Equipment:   Intra-op Plan:   Post-operative Plan: Extubation in OR  Informed Consent: I have reviewed the patients History and Physical, chart, labs and discussed the procedure including the risks, benefits and alternatives for the proposed anesthesia with the patient or authorized representative who has indicated his/her understanding and acceptance.     Plan Discussed with:   Anesthesia Plan Comments:         Anesthesia Quick Evaluation

## 2014-01-28 NOTE — Op Note (Signed)
01/28/2014  2:37 PM  PATIENT:  Ryan FarrStuart D [name]  68 y.o. male  PRE-OPERATIVE DIAGNOSIS:  non union olecranon  POST-OPERATIVE DIAGNOSIS:  non union olecranon  PROCEDURE:  Procedure(s): TRICEPS ADVANCEMENT AND HARDWARE REMOVAL LEFT ELBOW (Left)   Operative findings direct medication was noted between the skin olecranon nonunion and the elbow joint with synovial fluid draining through the wound.  SURGEON:  Surgeon(s) and Role:    * Vickki HearingStanley E Markees Carns, MD - Primary  PHYSICIAN ASSISTANT:   ASSISTANTS: Valetta CloseDebbie Dallas   ANESTHESIA:   general  EBL:  Total I/O In: 1420 [P.O.:220; I.V.:1200] Out: 100 [Blood:100]  BLOOD ADMINISTERED:none  DRAINS: 1 Hemovac in the subcutaneous tissue   LOCAL MEDICATIONS USED:  MARCAINE     SPECIMEN:  Source of Specimen:  cultures were taken anaerobic and aerobic left elbow  DISPOSITION OF SPECIMEN:  microbiology  COUNTS:  YES  TOURNIQUET:  * No tourniquets in log *  DICTATION: .Dragon Dictation  PLAN OF CARE: Discharge to home after PACU  PATIENT DISPOSITION:  PACU - hemodynamically stable.   Delay start of Pharmacological VTE agent (>24hrs) due to surgical blood loss or risk of bleeding: not applicable  Details of procedure 2 markers were used to identify the patient as Ryan [name]. This chart was reviewed and the surgical site was confirmed as left elbow.  Marking was performed.  Twisting to the operating room for general anesthesia. Antibiotics were started. This placed in the supine position without a tourniquet.  After sterile prep with Betadine and draping was done sterilely timeout was completed  A longitudinal incision was made using the previously used incision. Skin edges were debrided sharply with a drainage was occurring. After mobilizing the skin flaps, it was noted that the patient had a connection between the joint with a 1-1/2-2 cm opening between the fascia and triceps tendon. The plate was intact although the olecranon  fracture fragment had displaced proximally.  I made the decision at this time to ignore the olecranon fracture fragment which had not healed after several attempts. We did a triceps advancement instead. We used cotton umbilical tape we through the triceps tendon and then placed drill holes in the olecranon after passing this in place and the elbow in extension we tied the Decadron over a button. Range of motion at this point was flexion 120 extension out to 20. I then took a second Dacron tape we did through the tendon as well and then tied it in continuity with the fascia around the remaining portions of the ulna.  This essentially closed the communication between the joint and the skin and bypass the olecranon fracture fragment.  The wound was thoroughly irrigated and a subcutaneous drain was placed  The wound was closed with 2-0 Monocryl and staples. Sterile dressings were applied followed by Posterior splint was applied with the elbow at 70 flexion.  He was discharged on by mouth antibiotics pending culture

## 2014-01-28 NOTE — Anesthesia Postprocedure Evaluation (Signed)
  Anesthesia Post-op Note  Patient: Ryan [name]  Procedure(s) Performed: Procedure(s): TRICEPS ADVANCEMENT AND HARDWARE REMOVAL LEFT ELBOW (Left)  Patient Location: PACU  Anesthesia Type:General  Level of Consciousness: awake, alert  and oriented  Airway and Oxygen Therapy: Patient Spontanous Breathing  Post-op Pain: mild  Post-op Assessment: Post-op Vital signs reviewed, Patient's Cardiovascular Status Stable, Respiratory Function Stable, Patent Airway, No signs of Nausea or vomiting and Adequate PO intake  Post-op Vital Signs: Reviewed and stable  Last Vitals:  Filed Vitals:   01/28/14 1045  BP: 116/71  Pulse: 70  Temp:   Resp: 15    Complications: No apparent anesthesia complications

## 2014-01-28 NOTE — Brief Op Note (Signed)
01/28/2014  9:55 AM  PATIENT:  Ryan [name]  68 y.o. male  PRE-OPERATIVE DIAGNOSIS:  non union olecranon  POST-OPERATIVE DIAGNOSIS:  non union olecranon  PROCEDURE:  Procedure(s): TRICEPS ADVANCEMENT AND HARDWARE REMOVAL LEFT ELBOW (Left)  SURGEON:  Surgeon(s) and Role:    * Vickki HearingStanley E Rhonda Linan, MD - Primary  PHYSICIAN ASSISTANT:   ASSISTANTS: debbie dallas   ANESTHESIA:   general  EBL:  Total I/O In: 1000 [I.V.:1000] Out: 100 [Blood:100]  BLOOD ADMINISTERED:100 CC PRBC  DRAINS: hemovac  LOCAL MEDICATIONS USED:  MARCAINE     SPECIMEN:  Source of Specimen:  cultures of the wound   DISPOSITION OF SPECIMEN:  microbiology  COUNTS:  YES  TOURNIQUET:  * No tourniquets in log *  DICTATION: .Dragon Dictation  PLAN OF CARE: Discharge to home after PACU  PATIENT DISPOSITION:  PACU - hemodynamically stable.   Delay start of Pharmacological VTE agent (>24hrs) due to surgical blood loss or risk of bleeding: not applicable

## 2014-01-31 ENCOUNTER — Encounter: Payer: Self-pay | Admitting: Orthopedic Surgery

## 2014-01-31 ENCOUNTER — Ambulatory Visit (INDEPENDENT_AMBULATORY_CARE_PROVIDER_SITE_OTHER): Payer: Medicare Other | Admitting: Orthopedic Surgery

## 2014-01-31 VITALS — BP 94/65 | Ht 68.0 in | Wt 178.0 lb

## 2014-01-31 DIAGNOSIS — M19122 Post-traumatic osteoarthritis, left elbow: Secondary | ICD-10-CM

## 2014-01-31 DIAGNOSIS — L089 Local infection of the skin and subcutaneous tissue, unspecified: Secondary | ICD-10-CM

## 2014-01-31 DIAGNOSIS — L988 Other specified disorders of the skin and subcutaneous tissue: Secondary | ICD-10-CM

## 2014-01-31 DIAGNOSIS — S52022S Displaced fracture of olecranon process without intraarticular extension of left ulna, sequela: Secondary | ICD-10-CM

## 2014-01-31 LAB — WOUND CULTURE: Culture: NO GROWTH

## 2014-01-31 NOTE — Progress Notes (Signed)
Patient ID: Ryan FarrStuart D [name], male   DOB: [birth year], 68 y.o.   MRN: [id number] Chief Complaint  Patient presents with  . Follow-up    post op 1 LEFT elbow surgery DOS 01/28/14    BP 94/65 mmHg  Ht 5\' 8"  (1.727 m)  Wt 178 lb (80.74 kg)  BMI 27.07 kg/m2  Drain removed minimal drainage wound looks clean  What we did is we ignored the piece of bone which was the olecranon which was embedded in the triceps tendon and advanced the triceps tendon with 2 pieces of Decadron taper drill holes  Come back for staple removal postop day 13  Splint application posterior splint long-arm

## 2014-02-02 ENCOUNTER — Encounter (HOSPITAL_COMMUNITY): Payer: Self-pay | Admitting: Orthopedic Surgery

## 2014-02-02 LAB — ANAEROBIC CULTURE

## 2014-02-08 ENCOUNTER — Encounter: Payer: Self-pay | Admitting: Orthopedic Surgery

## 2014-02-08 ENCOUNTER — Ambulatory Visit (INDEPENDENT_AMBULATORY_CARE_PROVIDER_SITE_OTHER): Payer: Self-pay | Admitting: Orthopedic Surgery

## 2014-02-08 VITALS — BP 131/61 | Ht 68.0 in | Wt 178.0 lb

## 2014-02-08 DIAGNOSIS — S52022S Displaced fracture of olecranon process without intraarticular extension of left ulna, sequela: Secondary | ICD-10-CM

## 2014-02-08 NOTE — Progress Notes (Signed)
Patient ID: Ryan [name], male   DOB: 01-14-1946, 68 y.o.   MRN: [id number] Chief Complaint  Patient presents with  . Follow-up    post op 2, suture removal Left elbow, DOS 01/28/14    Surgical suture line intact no signs of infection continue sling follow-up 2 weeks

## 2014-02-22 ENCOUNTER — Ambulatory Visit (INDEPENDENT_AMBULATORY_CARE_PROVIDER_SITE_OTHER): Payer: Self-pay | Admitting: Orthopedic Surgery

## 2014-02-22 ENCOUNTER — Encounter: Payer: Self-pay | Admitting: Orthopedic Surgery

## 2014-02-22 VITALS — BP 129/78 | Ht 68.0 in | Wt 178.0 lb

## 2014-02-22 DIAGNOSIS — M19122 Post-traumatic osteoarthritis, left elbow: Secondary | ICD-10-CM

## 2014-02-22 DIAGNOSIS — S52022S Displaced fracture of olecranon process without intraarticular extension of left ulna, sequela: Secondary | ICD-10-CM

## 2014-02-22 DIAGNOSIS — L089 Local infection of the skin and subcutaneous tissue, unspecified: Secondary | ICD-10-CM

## 2014-02-22 DIAGNOSIS — L988 Other specified disorders of the skin and subcutaneous tissue: Secondary | ICD-10-CM

## 2014-02-22 NOTE — Patient Instructions (Signed)
SLING 3 MORE WEEKS

## 2014-02-22 NOTE — Progress Notes (Signed)
Patient ID: Ryan [name], male   DOB: Aug 10, 1945, 68 y.o.   MRN: [id number] Chief Complaint  Patient presents with  . Follow-up    post op 3 Left elbow, DOS 01/28/14   BP 129/78 mmHg  Ht 5\' 8"  (1.727 m)  Wt 178 lb (80.74 kg)  BMI 27.07 kg/m2 Encounter Diagnoses  Name Primary?  Marland Kitchen. Olecranon fracture, left, sequela   . Post-traumatic osteoarthritis of left elbow   . Draining cutaneous sinus tract Yes     the patient is 25 days status post removal of hardware, triceps advancement with bypass of olecranon fragment left elbow. Wound clean no drainage  Currently in sling  Continue sling 3 weeks return in 3 weeks  Continue antibiotics until they are finished. Bactrim.

## 2014-03-15 ENCOUNTER — Ambulatory Visit (INDEPENDENT_AMBULATORY_CARE_PROVIDER_SITE_OTHER): Payer: Self-pay | Admitting: Orthopedic Surgery

## 2014-03-15 VITALS — BP 110/77 | Ht 68.0 in | Wt 178.0 lb

## 2014-03-15 DIAGNOSIS — S52022S Displaced fracture of olecranon process without intraarticular extension of left ulna, sequela: Secondary | ICD-10-CM

## 2014-03-15 NOTE — Progress Notes (Signed)
Patient ID: Ryan [name], male   DOB: 1946-01-26, 68 y.o.   MRN: [id number] Encounter Diagnosis  Name Primary?  Marland Kitchen. Olecranon fracture, left, sequela Yes   BP 110/77 mmHg  Ht 5\' 8"  (1.727 m)  Wt 178 lb (80.74 kg)  BMI 27.07 kg/m2  Ryan [name] is now seventh week postop from a removal of hardware excision of scar tissue and suture repair of the triceps to the remaining ulna.  No evidence of effusion wound looks clean  Patient sling can be removed and he can go back to normal activities with no lifting  Follow-up one month

## 2014-04-14 ENCOUNTER — Ambulatory Visit (INDEPENDENT_AMBULATORY_CARE_PROVIDER_SITE_OTHER): Payer: Self-pay | Admitting: Orthopedic Surgery

## 2014-04-14 ENCOUNTER — Encounter: Payer: Self-pay | Admitting: Orthopedic Surgery

## 2014-04-14 VITALS — BP 144/84 | Ht 68.0 in | Wt 178.0 lb

## 2014-04-14 DIAGNOSIS — S52022S Displaced fracture of olecranon process without intraarticular extension of left ulna, sequela: Secondary | ICD-10-CM

## 2014-04-14 NOTE — Progress Notes (Signed)
Chief Complaint  Patient presents with  . Follow-up    2 month follow up Left elbow      Olecranon fracture, left, sequela  Yes     date of surgery most recently 01/28/2014 BP 144/84 mmHg  Ht 5\' 8"  (1.727 m)  Wt 178 lb (80.74 kg)  BMI 27.07 kg/m2   Ryan [name] 2-1/2 months removal of hardware excision of scar tissue and suture repair of the triceps to the remaining ulna.  His range of motion is excellent he has no effusion follow-up in 6 months

## 2014-09-06 ENCOUNTER — Ambulatory Visit (INDEPENDENT_AMBULATORY_CARE_PROVIDER_SITE_OTHER): Payer: Medicare Other

## 2014-09-06 ENCOUNTER — Encounter: Payer: Self-pay | Admitting: Orthopedic Surgery

## 2014-09-06 ENCOUNTER — Ambulatory Visit (INDEPENDENT_AMBULATORY_CARE_PROVIDER_SITE_OTHER): Payer: Medicare Other | Admitting: Orthopedic Surgery

## 2014-09-06 VITALS — BP 146/84 | Ht 68.0 in | Wt 185.0 lb

## 2014-09-06 DIAGNOSIS — M25552 Pain in left hip: Secondary | ICD-10-CM | POA: Diagnosis not present

## 2014-09-06 DIAGNOSIS — M7072 Other bursitis of hip, left hip: Secondary | ICD-10-CM | POA: Diagnosis not present

## 2014-09-06 DIAGNOSIS — M12552 Traumatic arthropathy, left hip: Secondary | ICD-10-CM | POA: Diagnosis not present
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
## 2014-10-06 ENCOUNTER — Encounter: Payer: Self-pay | Admitting: Orthopedic Surgery

## 2014-10-06 ENCOUNTER — Ambulatory Visit (INDEPENDENT_AMBULATORY_CARE_PROVIDER_SITE_OTHER): Payer: Medicare Other | Admitting: Orthopedic Surgery

## 2014-10-06 VITALS — BP 152/94 | Ht 68.0 in | Wt 185.0 lb

## 2014-10-06 DIAGNOSIS — M25552 Pain in left hip: Secondary | ICD-10-CM | POA: Diagnosis not present

## 2014-10-06 NOTE — Progress Notes (Signed)
Patient ID: Ryan [name], male   DOB: Jan 10, 1946, 69 y.o.   MRN: [id number]  Follow up visit  Chief Complaint  Patient presents with  . Follow-up    4 week follow up Left hip s/p injection    BP 152/94 mmHg  Ht 5\' 8"  (1.727 m)  Wt 185 lb (83.915 kg)  BMI 28.14 kg/m2  Encounter Diagnosis  Name Primary?  . Left hip pain Yes    The patient received an injection in his left hip and he got complete relief  Exam bleeding well and has no pain at this point  Follow up as needed

## 2014-10-13 ENCOUNTER — Ambulatory Visit: Payer: Medicare Other | Admitting: Orthopedic Surgery

## 2014-10-26 ENCOUNTER — Other Ambulatory Visit (HOSPITAL_COMMUNITY): Payer: Self-pay | Admitting: Family Medicine

## 2014-10-26 DIAGNOSIS — R05 Cough: Secondary | ICD-10-CM

## 2014-10-26 DIAGNOSIS — J40 Bronchitis, not specified as acute or chronic: Secondary | ICD-10-CM

## 2014-10-26 DIAGNOSIS — R059 Cough, unspecified: Secondary | ICD-10-CM

## 2014-11-14 ENCOUNTER — Ambulatory Visit (INDEPENDENT_AMBULATORY_CARE_PROVIDER_SITE_OTHER): Payer: Medicare Other | Admitting: Orthopedic Surgery

## 2014-11-14 VITALS — BP 129/82 | Ht 68.0 in | Wt 181.0 lb

## 2014-11-14 DIAGNOSIS — S52022S Displaced fracture of olecranon process without intraarticular extension of left ulna, sequela: Secondary | ICD-10-CM

## 2014-11-14 NOTE — Progress Notes (Signed)
Patient ID: FLORENTINO LAABS, male   DOB: 1945-11-23, 69 y.o.   MRN: [id number] Patient ID: ANDREW BLASIUS, male   DOB: 05-15-1945, 69 y.o.   MRN: [id number]  Follow up visit  Chief Complaint  Patient presents with  . Follow-up    6 month follow up left elbow    BP 129/82 mmHg  Ht  (1.727 m)  Wt 181 lb (82.101 kg)  BMI 27.53 kg/m2  Encounter Diagnosis  Name Primary?  Marland Kitchen Olecranon fracture, left, sequela Yes    The patient had multiple surgeries on the left elbow he comes in for routine follow-up he is pain free effusion free he's wearing a pad over the left elbow which keeps it from getting irritated  There are no signs of infection or effusion  He has very functional range of motion with no elbow instability  Follow-up when necessary

## 2014-11-21 ENCOUNTER — Other Ambulatory Visit (HOSPITAL_COMMUNITY): Payer: Self-pay | Admitting: Family Medicine

## 2014-11-21 ENCOUNTER — Ambulatory Visit (HOSPITAL_COMMUNITY)
Admission: RE | Admit: 2014-11-21 | Discharge: 2014-11-21 | Disposition: A | Discharge status: Home / Self Care | Payer: Medicare Other | Source: Ambulatory Visit | Attending: Family Medicine | Admitting: Family Medicine

## 2014-11-21 DIAGNOSIS — R05 Cough: Secondary | ICD-10-CM | POA: Diagnosis not present

## 2014-11-21 DIAGNOSIS — R059 Cough, unspecified: Secondary | ICD-10-CM

## 2014-12-06 ENCOUNTER — Ambulatory Visit (INDEPENDENT_AMBULATORY_CARE_PROVIDER_SITE_OTHER): Payer: Medicare Other

## 2014-12-06 ENCOUNTER — Encounter: Payer: Self-pay | Admitting: Orthopedic Surgery

## 2014-12-06 ENCOUNTER — Ambulatory Visit (INDEPENDENT_AMBULATORY_CARE_PROVIDER_SITE_OTHER): Payer: Medicare Other | Admitting: Orthopedic Surgery

## 2014-12-06 ENCOUNTER — Ambulatory Visit: Payer: Medicare Other

## 2014-12-06 VITALS — BP 138/92 | Ht 68.0 in | Wt 181.0 lb

## 2014-12-06 DIAGNOSIS — M129 Arthropathy, unspecified: Secondary | ICD-10-CM

## 2014-12-06 DIAGNOSIS — Z96651 Presence of right artificial knee joint: Secondary | ICD-10-CM

## 2014-12-06 DIAGNOSIS — Z96652 Presence of left artificial knee joint: Secondary | ICD-10-CM

## 2014-12-06 DIAGNOSIS — Z96653 Presence of artificial knee joint, bilateral: Secondary | ICD-10-CM

## 2014-12-06 DIAGNOSIS — M1710 Unilateral primary osteoarthritis, unspecified knee: Secondary | ICD-10-CM
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
## 2015-01-12 ENCOUNTER — Ambulatory Visit (INDEPENDENT_AMBULATORY_CARE_PROVIDER_SITE_OTHER): Payer: Medicare Other | Admitting: Orthopedic Surgery

## 2015-01-12 VITALS — BP 150/99 | Ht 68.0 in | Wt 181.0 lb

## 2015-01-12 DIAGNOSIS — M25552 Pain in left hip: Secondary | ICD-10-CM

## 2015-01-12 DIAGNOSIS — M7072 Other bursitis of hip, left hip: Secondary | ICD-10-CM | POA: Diagnosis not present

## 2015-01-12 DIAGNOSIS — Z9889 Other specified postprocedural states: Secondary | ICD-10-CM

## 2015-01-12 DIAGNOSIS — M12552 Traumatic arthropathy, left hip: Secondary | ICD-10-CM
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
## 2015-01-17 ENCOUNTER — Emergency Department (HOSPITAL_COMMUNITY): Payer: Medicare Other

## 2015-01-17 ENCOUNTER — Emergency Department (HOSPITAL_COMMUNITY)
Admission: EM | Admit: 2015-01-17 | Discharge: 2015-01-17 | Disposition: A | Discharge status: Home / Self Care | Payer: Medicare Other | Attending: Emergency Medicine | Admitting: Emergency Medicine

## 2015-01-17 ENCOUNTER — Encounter (HOSPITAL_COMMUNITY): Payer: Self-pay

## 2015-01-17 ENCOUNTER — Telehealth: Payer: Self-pay | Admitting: Orthopedic Surgery

## 2015-01-17 DIAGNOSIS — G8929 Other chronic pain: Secondary | ICD-10-CM | POA: Insufficient documentation

## 2015-01-17 DIAGNOSIS — Y9222 Religious institution as the place of occurrence of the external cause: Secondary | ICD-10-CM | POA: Diagnosis not present

## 2015-01-17 DIAGNOSIS — Y9389 Activity, other specified: Secondary | ICD-10-CM | POA: Insufficient documentation

## 2015-01-17 DIAGNOSIS — Z87891 Personal history of nicotine dependence: Secondary | ICD-10-CM | POA: Insufficient documentation

## 2015-01-17 DIAGNOSIS — I10 Essential (primary) hypertension: Secondary | ICD-10-CM | POA: Diagnosis not present

## 2015-01-17 DIAGNOSIS — S79912A Unspecified injury of left hip, initial encounter: Secondary | ICD-10-CM | POA: Diagnosis present

## 2015-01-17 DIAGNOSIS — W1839XA Other fall on same level, initial encounter: Secondary | ICD-10-CM | POA: Insufficient documentation

## 2015-01-17 DIAGNOSIS — M25552 Pain in left hip: Secondary | ICD-10-CM

## 2015-01-17 DIAGNOSIS — Z8739 Personal history of other diseases of the musculoskeletal system and connective tissue: Secondary | ICD-10-CM | POA: Diagnosis not present

## 2015-01-17 DIAGNOSIS — Y998 Other external cause status: Secondary | ICD-10-CM | POA: Insufficient documentation

## 2015-01-17 DIAGNOSIS — W19XXXA Unspecified fall, initial encounter: Secondary | ICD-10-CM

## 2015-01-17 MED ORDER — TRAMADOL HCL 50 MG PO TABS
50.0000 mg | ORAL_TABLET | Freq: Once | ORAL | Status: AC
  Administered 2015-01-17: 50 mg via ORAL
  Filled 2015-01-17: qty 1

## 2015-01-17 MED ORDER — TRAMADOL HCL 50 MG PO TABS: 50.0000 mg | ORAL_TABLET | Freq: Four times a day (QID) | ORAL | Status: DC | PRN

## 2015-01-17 NOTE — Telephone Encounter (Signed)
Patient and wife called today at approximately 4:22pm to relay that he is having more problems with same hip, left, for which he was recently seen in our office.  Relays that patient had a fall at church today and had a difficult time getting back up, and is in a good deal of pain.  Requests appointment with Dr Romeo AppleHarrison as soon as possible.  Relayed that Dr Romeo AppleHarrison was finished with clinic patients, and that he recommends evaluation and work-up at Emergency room.  Patient was hesitant but voiced understanding and said will go there this evening.

## 2015-01-17 NOTE — Discharge Instructions (Signed)
Hip Pain Your hip is the joint between your upper legs and your lower pelvis. The bones, cartilage, tendons, and muscles of your hip joint perform a lot of work each day supporting your body weight and allowing you to move around. Hip pain can range from a minor ache to severe pain in one or both of your hips. Pain may be felt on the inside of the hip joint near the groin, or the outside near the buttocks and upper thigh. You may have swelling or stiffness as well.  HOME CARE INSTRUCTIONS   Take medicines only as directed by your health care provider.  Apply ice to the injured area:  Put ice in a plastic bag.  Place a towel between your skin and the bag.  Leave the ice on for 15-20 minutes at a time, 3-4 times a day.  Keep your leg raised (elevated) when possible to lessen swelling.  Avoid activities that cause pain.  Follow specific exercises as directed by your health care provider.  Sleep with a pillow between your legs on your most comfortable side.  Record how often you have hip pain, the location of the pain, and what it feels like. SEEK MEDICAL CARE IF:   You are unable to put weight on your leg.  Your hip is red or swollen or very tender to touch.  Your pain or swelling continues or worsens after 1 week.  You have increasing difficulty walking.  You have a fever. SEEK IMMEDIATE MEDICAL CARE IF:   You have fallen.  You have a sudden increase in pain and swelling in your hip. MAKE SURE YOU:   Understand these instructions.  Will watch your condition.  Will get help right away if you are not doing well or get worse.   This information is not intended to replace advice given to you by your health care provider. Make sure you discuss any questions you have with your health care provider.   Document Released: 08/29/2009 Document Revised: 04/01/2014 Document Reviewed: 11/05/2012 Elsevier Interactive Patient Education [employer]   Your x-rays are  negative today for any injury from your fall.  As discussed, you do have severe arthritis in your hip.  I recommend applying ice therapy for the next 1-2 days which will help with acute pain and any swelling occurring from your injury.  You may also add a heating pad starting in 2 days 20 minutes several times daily.  Use the medication prescribed for pain relief.  Planned follow-up with Dr. Romeo AppleHarrison early next week if your symptoms are not improving.

## 2015-01-17 NOTE — ED Notes (Signed)
Pt reports chronic left hip pain.  Reports had cortisone injection last THursday.  Reports pt fell at church today and since then has had worsening pain.
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
## 2015-01-24 ENCOUNTER — Ambulatory Visit (INDEPENDENT_AMBULATORY_CARE_PROVIDER_SITE_OTHER): Payer: Medicare Other

## 2015-01-24 ENCOUNTER — Ambulatory Visit (INDEPENDENT_AMBULATORY_CARE_PROVIDER_SITE_OTHER): Payer: Medicare Other | Admitting: Orthopedic Surgery

## 2015-01-24 VITALS — BP 155/83 | Ht 68.0 in | Wt 182.0 lb

## 2015-01-24 DIAGNOSIS — M4806 Spinal stenosis, lumbar region: Secondary | ICD-10-CM | POA: Diagnosis not present

## 2015-01-24 DIAGNOSIS — M5442 Lumbago with sciatica, left side: Secondary | ICD-10-CM | POA: Diagnosis not present

## 2015-01-24 DIAGNOSIS — M48061 Spinal stenosis, lumbar region without neurogenic claudication: Secondary | ICD-10-CM

## 2015-01-24 NOTE — Patient Instructions (Signed)
We will schedule MRI for you and call you with appointment 
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
## 2015-01-27 ENCOUNTER — Ambulatory Visit (HOSPITAL_COMMUNITY)
Admission: RE | Admit: 2015-01-27 | Discharge: 2015-01-27 | Disposition: A | Discharge status: Home / Self Care | Payer: Medicare Other | Source: Ambulatory Visit | Attending: Orthopedic Surgery | Admitting: Orthopedic Surgery

## 2015-01-27 DIAGNOSIS — R262 Difficulty in walking, not elsewhere classified: Secondary | ICD-10-CM | POA: Insufficient documentation

## 2015-01-27 DIAGNOSIS — M4317 Spondylolisthesis, lumbosacral region: Secondary | ICD-10-CM | POA: Diagnosis not present

## 2015-01-27 DIAGNOSIS — I77811 Abdominal aortic ectasia: Secondary | ICD-10-CM | POA: Insufficient documentation

## 2015-01-27 DIAGNOSIS — M4806 Spinal stenosis, lumbar region: Secondary | ICD-10-CM | POA: Diagnosis not present

## 2015-01-27 DIAGNOSIS — M5136 Other intervertebral disc degeneration, lumbar region: Secondary | ICD-10-CM | POA: Insufficient documentation

## 2015-01-27 DIAGNOSIS — M545 Low back pain: Secondary | ICD-10-CM | POA: Diagnosis present

## 2015-01-27 DIAGNOSIS — M48061 Spinal stenosis, lumbar region without neurogenic claudication: Secondary | ICD-10-CM

## 2015-01-30 ENCOUNTER — Telehealth: Payer: Self-pay | Admitting: Orthopedic Surgery

## 2015-01-30 ENCOUNTER — Other Ambulatory Visit: Payer: Self-pay | Admitting: *Deleted

## 2015-01-30 DIAGNOSIS — M48061 Spinal stenosis, lumbar region without neurogenic claudication: Secondary | ICD-10-CM

## 2015-01-30 NOTE — Telephone Encounter (Signed)
Referral faxed to Dr Otoole 

## 2015-01-30 NOTE — Telephone Encounter (Signed)
IMPRESSION: 1. Ectatic abdominal aorta at risk for aneurysm development. Recommend followup by Ultrasound in 5 years. This recommendation follows ACR consensus guidelines: White Paper of the ACR Incidental Findings Committee II on Vascular Findings. J Am Coll Radiol 2013; 10:789-794. 2. Grade 1 spondylolisthesis at L5-S1 with disc and severe facet degeneration. Only mild left L5 foraminal stenosis results. 3. Disc, endplate, and moderate to severe facet and ligament flavum degeneration at L4-L5. Mild spinal stenosis and mild left L4 foraminal stenosis. 4. Disc, endplate, and facet degeneration at L3-L4. Mild right lateral recess and right greater than left L3 foraminal stenosis.     Electronically Signed   By: Odessa FlemingH  Hall M.D.   On: 01/27/2015 15:07

## 2015-02-15 ENCOUNTER — Other Ambulatory Visit (HOSPITAL_COMMUNITY): Payer: Medicare Other

## 2015-02-20 NOTE — Telephone Encounter (Signed)
appt 03/10/15 @ 8:30a

## 2015-04-17 DIAGNOSIS — Z886 Allergy status to analgesic agent status: Secondary | ICD-10-CM | POA: Diagnosis not present

## 2015-04-17 DIAGNOSIS — M47816 Spondylosis without myelopathy or radiculopathy, lumbar region: Secondary | ICD-10-CM | POA: Diagnosis not present

## 2015-04-17 DIAGNOSIS — Z809 Family history of malignant neoplasm, unspecified: Secondary | ICD-10-CM | POA: Diagnosis not present

## 2015-04-17 DIAGNOSIS — M545 Low back pain: Secondary | ICD-10-CM | POA: Diagnosis not present

## 2015-04-17 DIAGNOSIS — Z96653 Presence of artificial knee joint, bilateral: Secondary | ICD-10-CM | POA: Diagnosis not present

## 2015-04-17 DIAGNOSIS — I10 Essential (primary) hypertension: Secondary | ICD-10-CM | POA: Diagnosis not present

## 2015-04-17 DIAGNOSIS — Z87891 Personal history of nicotine dependence: Secondary | ICD-10-CM | POA: Diagnosis not present

## 2015-04-17 DIAGNOSIS — Z79899 Other long term (current) drug therapy: Secondary | ICD-10-CM | POA: Diagnosis not present

## 2015-05-08 DIAGNOSIS — M47817 Spondylosis without myelopathy or radiculopathy, lumbosacral region: Secondary | ICD-10-CM | POA: Diagnosis not present

## 2015-05-08 DIAGNOSIS — Z87891 Personal history of nicotine dependence: Secondary | ICD-10-CM | POA: Diagnosis not present

## 2015-05-08 DIAGNOSIS — Z79899 Other long term (current) drug therapy: Secondary | ICD-10-CM | POA: Diagnosis not present

## 2015-05-08 DIAGNOSIS — M47816 Spondylosis without myelopathy or radiculopathy, lumbar region: Secondary | ICD-10-CM | POA: Diagnosis not present

## 2015-05-08 DIAGNOSIS — Z96653 Presence of artificial knee joint, bilateral: Secondary | ICD-10-CM | POA: Diagnosis not present

## 2015-05-08 DIAGNOSIS — M545 Low back pain: Secondary | ICD-10-CM | POA: Diagnosis not present

## 2015-05-08 DIAGNOSIS — I10 Essential (primary) hypertension: Secondary | ICD-10-CM | POA: Diagnosis not present

## 2015-05-08 DIAGNOSIS — Z886 Allergy status to analgesic agent status: Secondary | ICD-10-CM | POA: Diagnosis not present

## 2015-06-07 DIAGNOSIS — M47817 Spondylosis without myelopathy or radiculopathy, lumbosacral region: Secondary | ICD-10-CM | POA: Diagnosis not present

## 2015-06-07 DIAGNOSIS — M545 Low back pain: Secondary | ICD-10-CM | POA: Diagnosis not present

## 2015-06-07 DIAGNOSIS — M4317 Spondylolisthesis, lumbosacral region: Secondary | ICD-10-CM | POA: Diagnosis not present

## 2015-06-07 DIAGNOSIS — M25552 Pain in left hip: Secondary | ICD-10-CM | POA: Diagnosis not present

## 2015-06-07 DIAGNOSIS — M4316 Spondylolisthesis, lumbar region: Secondary | ICD-10-CM | POA: Diagnosis not present

## 2015-06-22 DIAGNOSIS — J111 Influenza due to unidentified influenza virus with other respiratory manifestations: Secondary | ICD-10-CM | POA: Diagnosis not present

## 2015-06-22 DIAGNOSIS — R070 Pain in throat: Secondary | ICD-10-CM | POA: Diagnosis not present

## 2015-06-22 DIAGNOSIS — Z1389 Encounter for screening for other disorder: Secondary | ICD-10-CM | POA: Diagnosis not present

## 2015-06-22 DIAGNOSIS — J069 Acute upper respiratory infection, unspecified: Secondary | ICD-10-CM | POA: Diagnosis not present

## 2015-06-22 DIAGNOSIS — Z6829 Body mass index (BMI) 29.0-29.9, adult: Secondary | ICD-10-CM | POA: Diagnosis not present

## 2015-06-22 DIAGNOSIS — J343 Hypertrophy of nasal turbinates: Secondary | ICD-10-CM | POA: Diagnosis not present

## 2015-06-22 DIAGNOSIS — J329 Chronic sinusitis, unspecified: Secondary | ICD-10-CM | POA: Diagnosis not present

## 2015-07-25 DIAGNOSIS — L820 Inflamed seborrheic keratosis: Secondary | ICD-10-CM | POA: Diagnosis not present

## 2015-07-25 DIAGNOSIS — Z08 Encounter for follow-up examination after completed treatment for malignant neoplasm: Secondary | ICD-10-CM | POA: Diagnosis not present

## 2015-07-25 DIAGNOSIS — L570 Actinic keratosis: Secondary | ICD-10-CM | POA: Diagnosis not present

## 2015-07-25 DIAGNOSIS — X32XXXD Exposure to sunlight, subsequent encounter: Secondary | ICD-10-CM | POA: Diagnosis not present

## 2015-07-25 DIAGNOSIS — Z85828 Personal history of other malignant neoplasm of skin: Secondary | ICD-10-CM | POA: Diagnosis not present

## 2015-12-07 ENCOUNTER — Ambulatory Visit: Payer: Medicare Other | Admitting: Orthopedic Surgery

## 2015-12-11 ENCOUNTER — Ambulatory Visit (INDEPENDENT_AMBULATORY_CARE_PROVIDER_SITE_OTHER): Payer: PPO | Admitting: Orthopedic Surgery

## 2015-12-11 ENCOUNTER — Ambulatory Visit: Payer: Medicare Other

## 2015-12-11 ENCOUNTER — Ambulatory Visit (INDEPENDENT_AMBULATORY_CARE_PROVIDER_SITE_OTHER): Payer: PPO

## 2015-12-11 ENCOUNTER — Encounter: Payer: Self-pay | Admitting: Orthopedic Surgery

## 2015-12-11 VITALS — BP 145/78 | HR 60 | Ht 68.0 in | Wt 196.0 lb

## 2015-12-11 DIAGNOSIS — Z96653 Presence of artificial knee joint, bilateral: Secondary | ICD-10-CM | POA: Diagnosis not present

## 2015-12-11 DIAGNOSIS — M1710 Unilateral primary osteoarthritis, unspecified knee: Secondary | ICD-10-CM

## 2015-12-11 DIAGNOSIS — M129 Arthropathy, unspecified: Secondary | ICD-10-CM

## 2015-12-11 DIAGNOSIS — M79671 Pain in right foot: Secondary | ICD-10-CM

## 2015-12-11 NOTE — Addendum Note (Signed)
Addended by: Adella HareBOOTHE, JAIME B on: 12/11/2015 11:59 AM   Modules accepted: Orders
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
## 2015-12-11 NOTE — Patient Instructions (Signed)
Referral to DR Nolen MuMcKinney for foot pain

## 2015-12-20 DIAGNOSIS — M2021 Hallux rigidus, right foot: Secondary | ICD-10-CM | POA: Diagnosis not present

## 2015-12-20 DIAGNOSIS — M79671 Pain in right foot: Secondary | ICD-10-CM | POA: Diagnosis not present

## 2016-01-02 IMAGING — CR DG ELBOW COMPLETE 3+V*L*
4 series · 4 of 4 positions shown · non-contrast
Comparison: 12/01/2012

CLINICAL DATA: Fluid oozing from left elbow. History of old
fracture and numerous surgery. No recent injury.

EXAM:
LEFT ELBOW - COMPLETE 3+ VIEW

[view not recorded (1 of 4)]
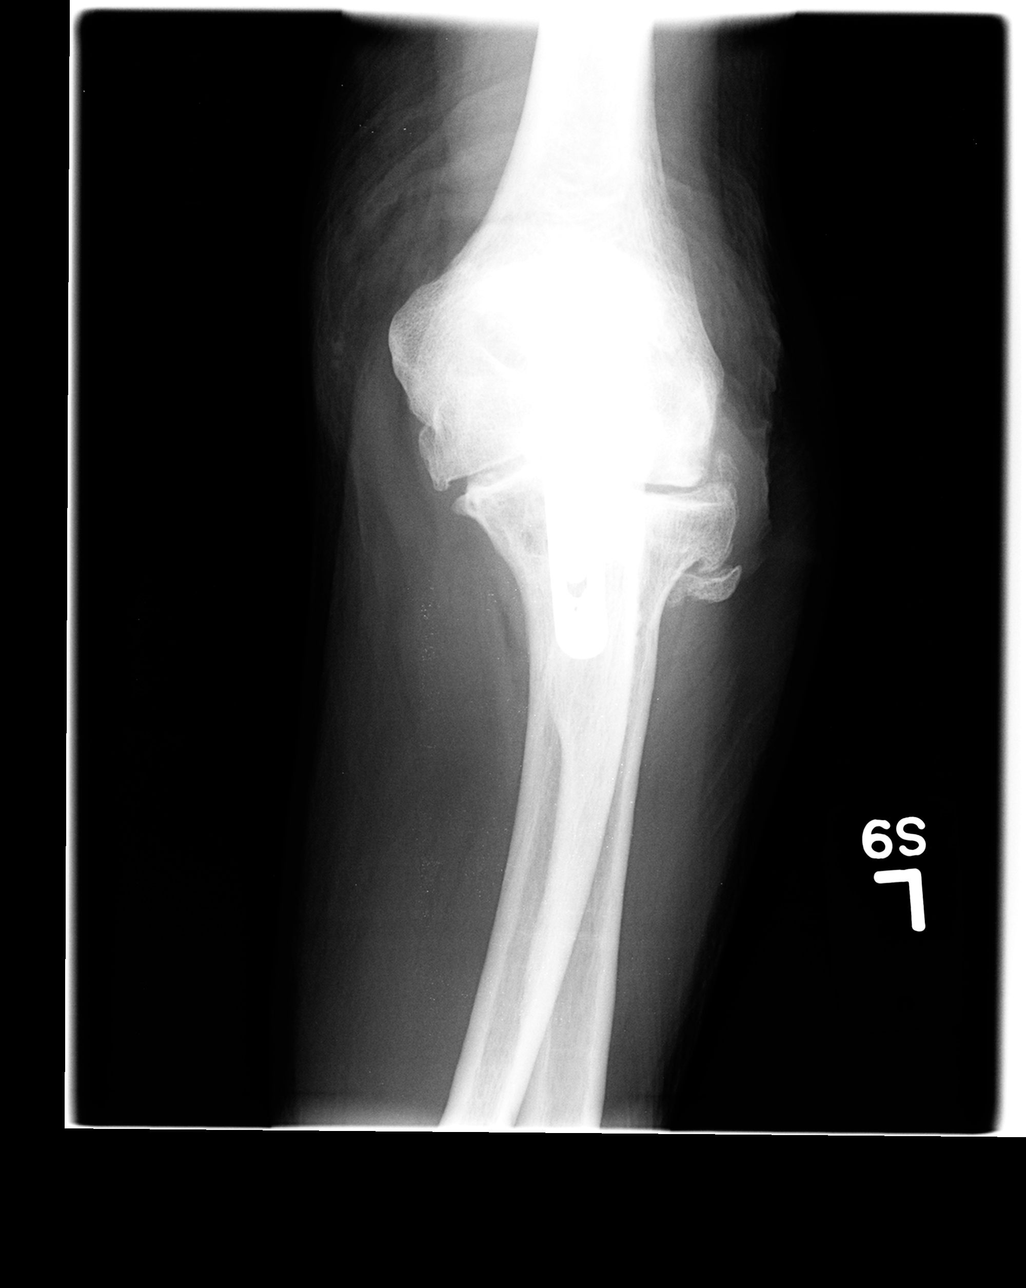

[view not recorded (2 of 4)]
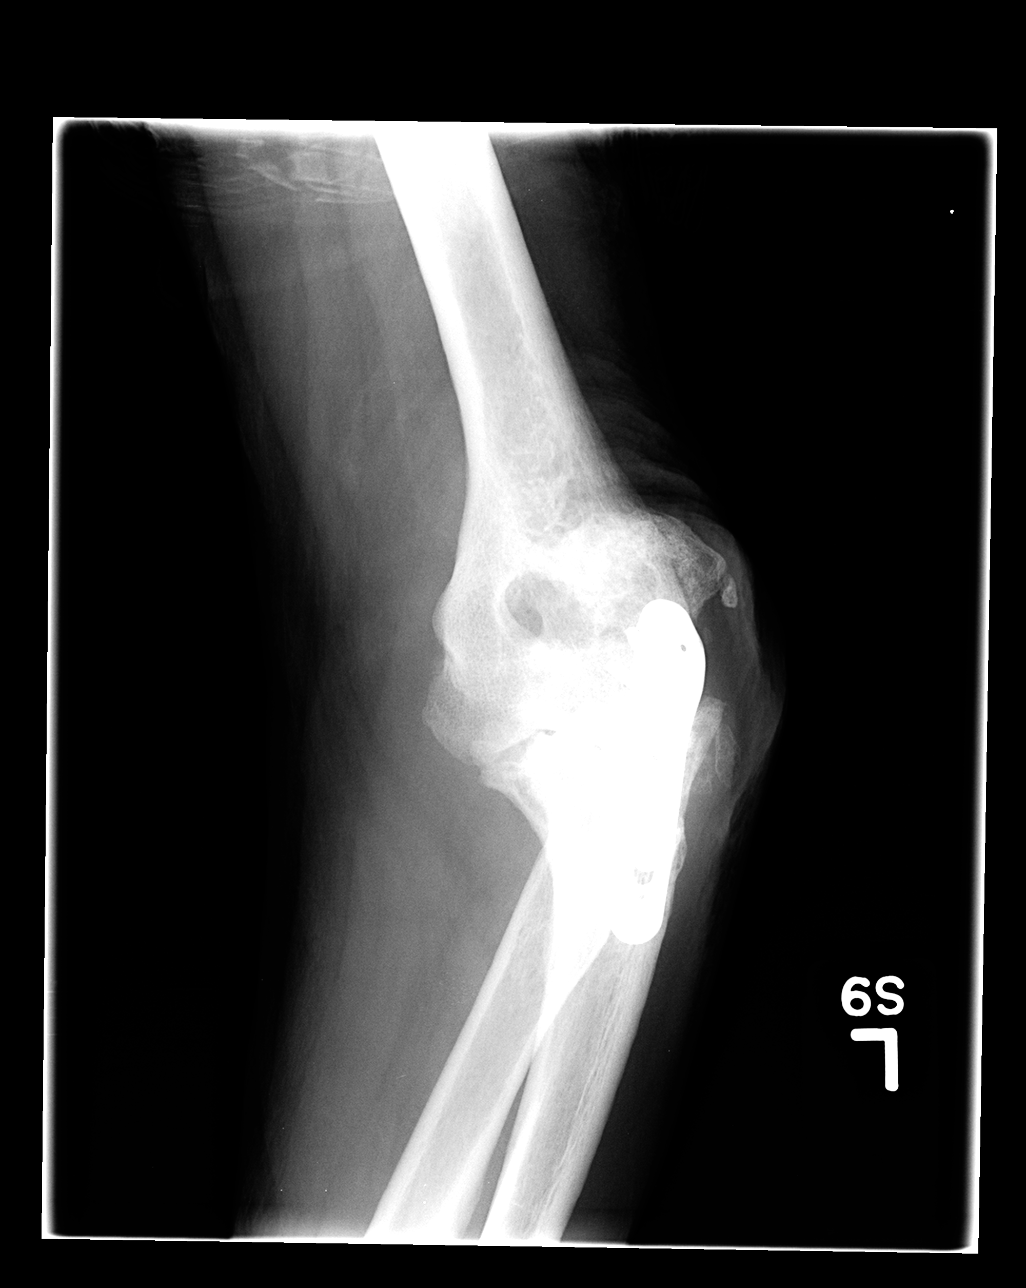

[view not recorded (3 of 4)]
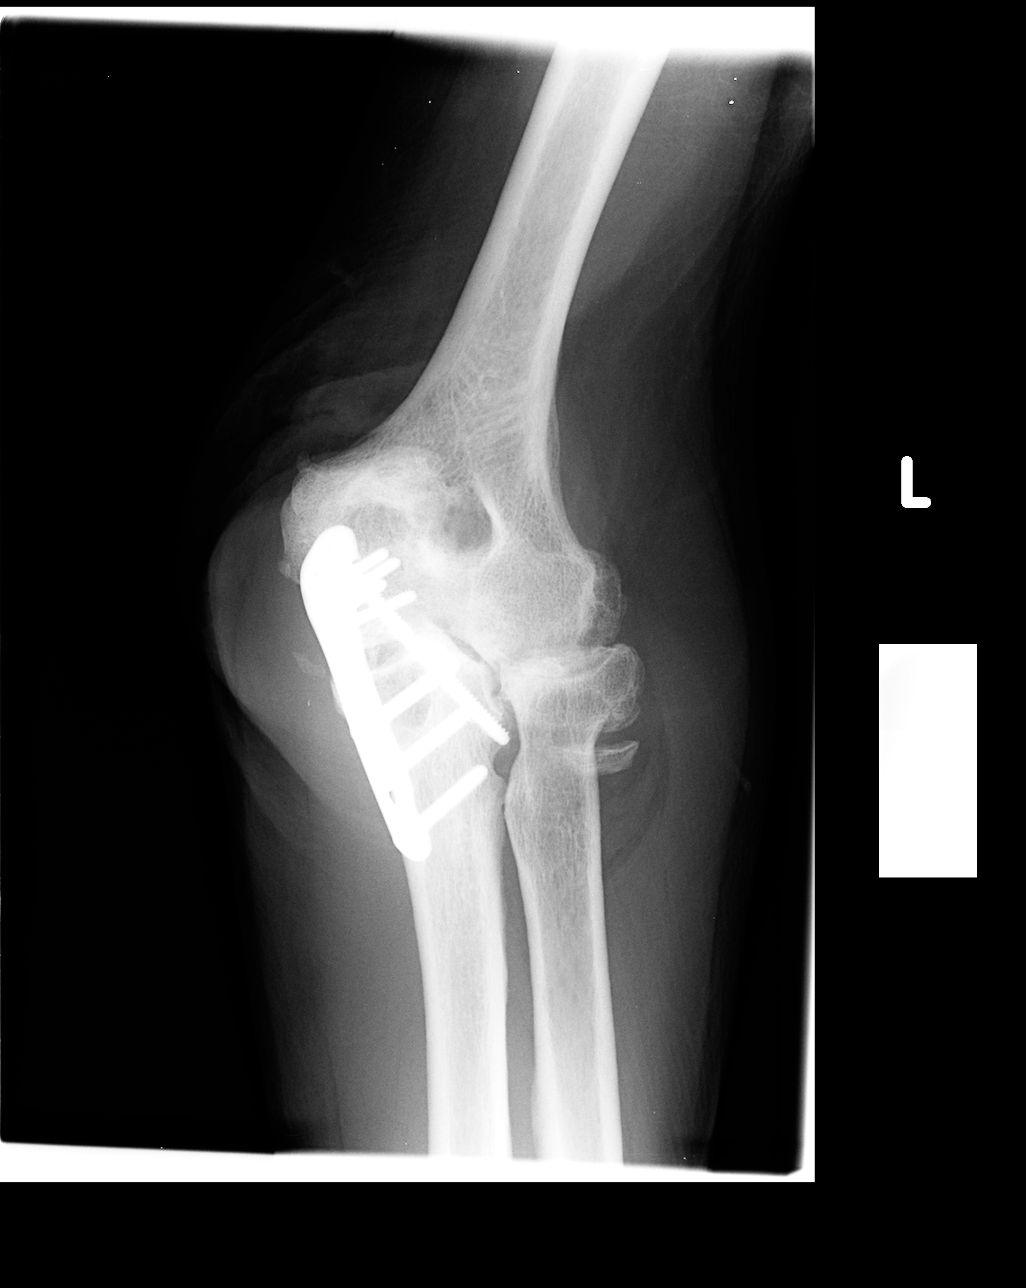

[view not recorded (4 of 4)]
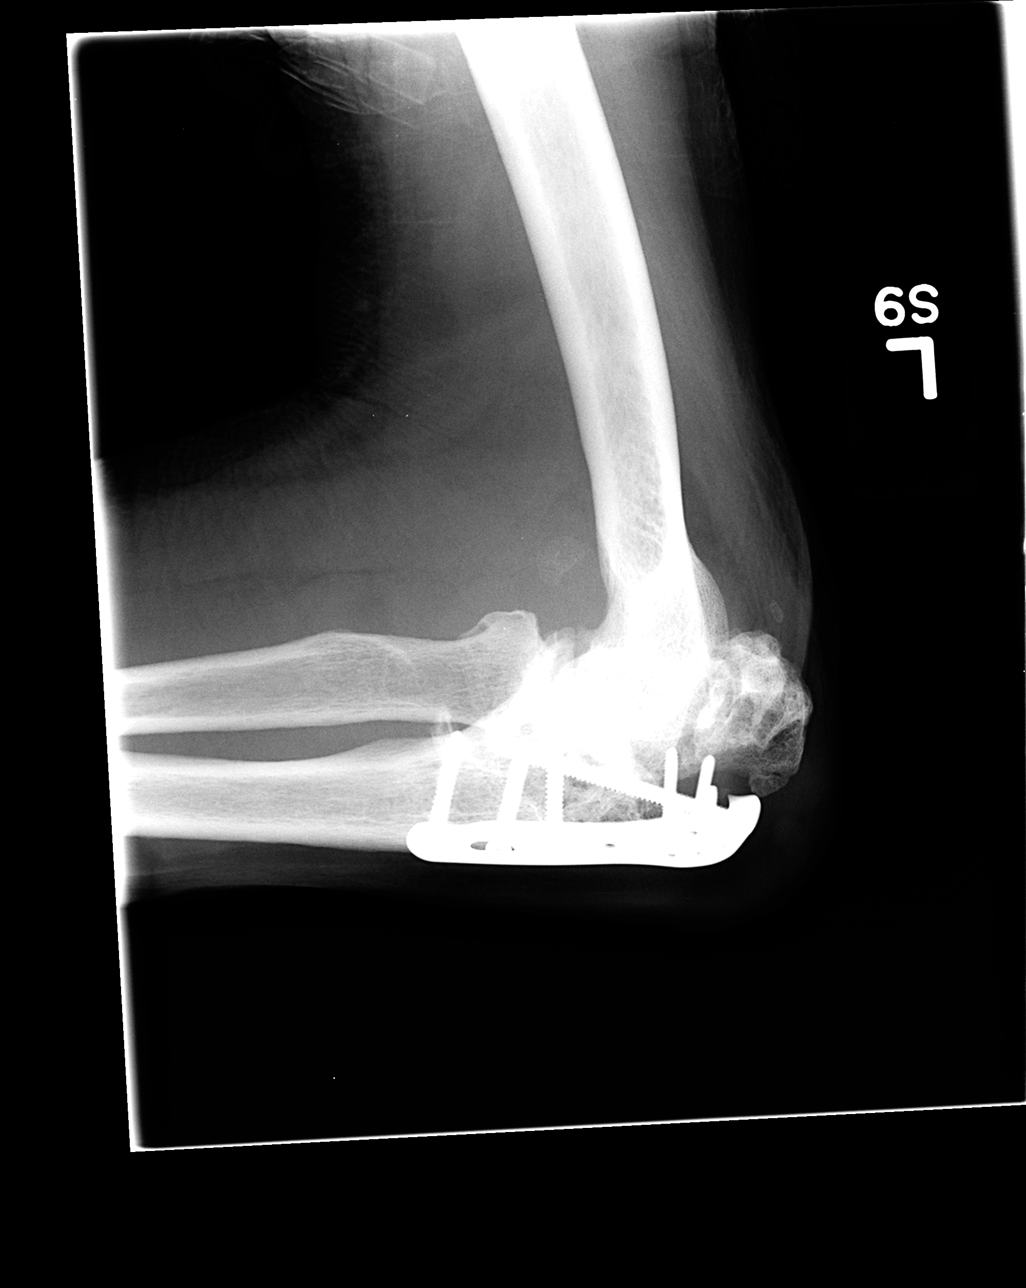

[4 of 4 positions shown; findings below may reference images not displayed]

FINDINGS: Post traumatic and postoperative changes within the left elbow, with
hardware noted within the proximal ulna in the region of the
olecranon process. The olecranon process is again noted separate
from the screws with a stable appearance. There is a large joint
effusion. This likely has increased since prior study.
Intra-articular loose bodies noted. No radiographic changes to
suggest osteomyelitis. No visible acute bony abnormality.
IMPRESSION: Posttraumatic and postoperative deformity within the left elbow,
particularly proximal ulna. There is a large joint effusion,
increasing since prior study with intra-articular loose bodies. No
convincing radiographic evidence of osteomyelitis.

## 2016-01-12 DIAGNOSIS — M205X1 Other deformities of toe(s) (acquired), right foot: Secondary | ICD-10-CM | POA: Diagnosis not present

## 2016-01-12 DIAGNOSIS — M79673 Pain in unspecified foot: Secondary | ICD-10-CM | POA: Diagnosis not present

## 2016-01-12 DIAGNOSIS — M2140 Flat foot [pes planus] (acquired), unspecified foot: Secondary | ICD-10-CM | POA: Diagnosis not present

## 2016-01-26 DIAGNOSIS — J209 Acute bronchitis, unspecified: Secondary | ICD-10-CM | POA: Diagnosis not present

## 2016-01-26 DIAGNOSIS — M2140 Flat foot [pes planus] (acquired), unspecified foot: Secondary | ICD-10-CM | POA: Diagnosis not present

## 2016-01-26 DIAGNOSIS — Z6829 Body mass index (BMI) 29.0-29.9, adult: Secondary | ICD-10-CM | POA: Diagnosis not present

## 2016-01-26 DIAGNOSIS — M79673 Pain in unspecified foot: Secondary | ICD-10-CM | POA: Diagnosis not present

## 2016-01-26 DIAGNOSIS — M205X1 Other deformities of toe(s) (acquired), right foot: Secondary | ICD-10-CM | POA: Diagnosis not present

## 2016-01-26 DIAGNOSIS — M2141 Flat foot [pes planus] (acquired), right foot: Secondary | ICD-10-CM | POA: Diagnosis not present

## 2016-05-22 DIAGNOSIS — I10 Essential (primary) hypertension: Secondary | ICD-10-CM | POA: Diagnosis not present

## 2016-05-22 DIAGNOSIS — Z6830 Body mass index (BMI) 30.0-30.9, adult: Secondary | ICD-10-CM | POA: Diagnosis not present

## 2016-05-22 DIAGNOSIS — Z23 Encounter for immunization: Secondary | ICD-10-CM | POA: Diagnosis not present

## 2016-05-22 DIAGNOSIS — J209 Acute bronchitis, unspecified: Secondary | ICD-10-CM | POA: Diagnosis not present

## 2016-05-22 DIAGNOSIS — E785 Hyperlipidemia, unspecified: Secondary | ICD-10-CM | POA: Diagnosis not present

## 2016-07-22 DIAGNOSIS — M79673 Pain in unspecified foot: Secondary | ICD-10-CM | POA: Diagnosis not present

## 2016-07-22 DIAGNOSIS — M205X1 Other deformities of toe(s) (acquired), right foot: Secondary | ICD-10-CM | POA: Diagnosis not present

## 2016-08-06 ENCOUNTER — Ambulatory Visit (INDEPENDENT_AMBULATORY_CARE_PROVIDER_SITE_OTHER): Payer: PPO

## 2016-08-06 ENCOUNTER — Ambulatory Visit (INDEPENDENT_AMBULATORY_CARE_PROVIDER_SITE_OTHER): Payer: PPO | Admitting: Orthopedic Surgery

## 2016-08-06 ENCOUNTER — Encounter: Payer: Self-pay | Admitting: Orthopedic Surgery

## 2016-08-06 DIAGNOSIS — M1612 Unilateral primary osteoarthritis, left hip: Secondary | ICD-10-CM | POA: Diagnosis not present

## 2016-08-06 DIAGNOSIS — M25552 Pain in left hip: Secondary | ICD-10-CM
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
## 2016-08-06 NOTE — Patient Instructions (Signed)
You have decided to proceed with hip replacement surgery. You have decided not to continue with nonoperative measures such as but not limited to oral medication, weight loss, activity modification, physical therapy, or injection.  We will perform the procedure commonly known as total hip replacement. Some of the risks associated with total hip replacement include but are not limited to Bleeding Infection Swelling Stiffness Blood clot Continued Pain Dislocation LEG LENGTH INEQUALITY REQUIRING A SHOE LIFT Persistent limp  Infection is a devastating complication of joint replacement surgery. If you get an infection the implant usually will have to be removed and several surgeries and antibiotics will be needed to eradicate the infection. In some rare cases the hip cannot be reconstructed with another implant. This will lead to permanent need for crutches and assistive devices to ambulate.   If you're not comfortable with these risks and would like to continue with nonoperative treatment please let Dr. Romeo AppleHarrison know prior to your surgery.

## 2016-08-14 ENCOUNTER — Other Ambulatory Visit: Payer: Self-pay | Admitting: *Deleted

## 2016-09-02 NOTE — Patient Instructions (Signed)
Ryan [name]  09/02/2016     @PREFPERIOPPHARMACY @   Your procedure is scheduled on  09/11/2016   Report to [facility] at  615  A.M.  Call this number if you have problems the morning of surgery:  [phone]   Remember:  Do not eat food or drink liquids after midnight.  Take these medicines the morning of surgery with A SIP OF WATER  microzide   Do not wear jewelry, make-up or nail polish.  Do not wear lotions, powders, or perfumes, or deoderant.  Do not shave 48 hours prior to surgery.  Men may shave face and neck.  Do not bring valuables to the hospital.  [facility] is not responsible for any belongings or valuables.  Contacts, dentures or bridgework may not be worn into surgery.  Leave your suitcase in the car.  After surgery it may be brought to your room.  For patients admitted to the hospital, discharge time will be determined by your treatment team.  Patients discharged the day of surgery will not be allowed to drive home.   Name and phone number of your driver:   family Special instructions:  None  Please read over the following fact sheets that you were given. Pain Booklet, Coughing and Deep Breathing, Blood Transfusion Information, Total Joint Packet, MRSA Information, Surgical Site Infection Prevention, Anesthesia Post-op Instructions and Care and Recovery After Surgery       Spinal Anesthesia and Epidural Anesthesia Spinal anesthesia and epidural anesthesia are ways to numb a part of the body. They are often used:  During childbirth.  During surgery on certain parts of the body. These include: ? Hip. ? Pelvis. ? Legs. ? Lower belly (abdomen).  After surgery on certain parts of the body. These include: ? Belly. ? Chest.  What happens before the procedure? Staying hydrated Follow instructions from your doctor about drinking (hydration). These may include:  Up to 2 hours before the procedure - you may continue to drink clear  liquids. Some examples of clear liquids are: ? Water. ? Clear fruit juice. ? Black coffee. ? Plain tea.  Eating and drinking restrictions Follow instructions from your doctor about eating and drinking. These may include:  8 hours before the procedure - stop eating heavy meals or foods. Examples of these are meat, fried foods, or fatty foods.  6 hours before the procedure - stop eating light meals or foods. Examples of these are toast or cereal.  6 hours before the procedure - stop drinking milk or drinks that have milk in them.  2 hours before the procedure - stop drinking clear liquids.  Medicine Ask your doctor about:  Changing or stopping your regular medicines. This is especially important if you are taking diabetes medicines or blood thinners.  Changing or stopping your dietary supplements.  Taking medicines such as aspirin and ibuprofen. These medicines can thin your blood. Do not take these medicines before your procedure if your doctor tells you not to.  General instructions   Do not use any tobacco products for as long as possible. ? Examples of these are cigarettes, chewing tobacco, and e-cigarettes. ? If you need help quitting, ask your doctor.  Ask your doctor if you will have to stay overnight at the hospital or clinic.  If you will not have to stay overnight: ? Plan to have someone take you home. ? Plan to have someone with you for 24 hours.  What happens during the procedure?  A doctor will put patches on your chest, a cuff around your arm, or a sensor device on your finger. They will be connected to monitors.  An IV tube may be put into one of your veins. This tube is used to give fluids and medicines.  You may be given a medicine to help you relax (sedative).  You may be asked to: ? Sit up. ? Lie on your side. ? Bend your knees and chin toward your chest.  An area of your back will be cleaned.  You may get a shot of medicine in your back. The  medicine will help prevent pain from the shot of numbing medicine.  A needle will be put between the bones of your back. While this is being done: ? Breathe normally. ? Try not to move. ? Stay as quiet as you can. ? Tell your doctor if you feel a tingling shock or pain going down your leg.  You will get the shot of numbing medicine.  If you need more medicine, a tube (catheter) may be put in the place where you got the shot. The tube will be used to give you more medicine. It will be left in if you need pain medicine after the procedure.  A bandage (dressing) may be put on your back. The procedure may vary among doctors and hospitals. What happens after the procedure?  Stay in bed until your doctor says it is safe to walk.  Your doctors will check on you often until the medicines wear off.  If there is a tube in your back, it will be taken out when it is no longer needed.  Do not drive for 24 hours if you were given a medicine to help you relax (sedative).  It is common to feel sick to your stomach (nauseous) and itchy. There are medicines that can help. It is also common to: ? Be sleepy. ? Throw up (vomit). ? Have numbness or tingling in your legs. ? Have trouble peeing (urinating). This information is not intended to replace advice given to you by your health care provider. Make sure you discuss any questions you have with your health care provider. Document Released: 07/03/2015 Document Revised: 08/22/2015 Document Reviewed: 07/03/2015 Elsevier Interactive Patient Education  2018 [employer].  Spinal Anesthesia and Epidural Anesthesia, Care After These instructions give you information about caring for yourself after your procedure. Your doctor may also give you more specific instructions. Call your doctor if you have any problems or questions after your procedure. Follow these instructions at home: For at least 24 hours after the procedure:   Do not: ? Do activities  where you could fall or get hurt (injured). ? Drive. ? Use heavy machinery. ? Drink alcohol. ? Take sleeping pills or medicines that make you sleepy (drowsy). ? Make important decisions. ? Sign legal documents. ? Take care of children on your own.  Rest. Eating and drinking  If you throw up (vomit), drink water, juice, or soup when you can drink without throwing up.  Make sure you do not feel like throwing up (are not nauseous) before you eat.  Follow the diet that is recommended by your doctor. General instructions  Have a responsible adult stay with you until you are awake and alert.  Take over-the-counter and prescription medicines only as told by your doctor.  If you smoke, do not smoke unless an adult is watching.  Keep all follow-up visits as  told by your doctor. This is important. Contact a doctor if:  It has been more than one day since your procedure and you feel like throwing up.  It has been more than one day since your procedure and you throw up.  You have a rash. Get help right away if:  You have a fever.  You have a headache that lasts a long time.  You have a very bad headache.  Your vision is blurry.  You see two of a single object (double vision).  You are dizzy or light-headed.  You faint.  Your arms or legs tingle, feel weak, or get numb.  You have trouble breathing.  You cannot pee (urinate). This information is not intended to replace advice given to you by your health care provider. Make sure you discuss any questions you have with your health care provider. Document Released: 07/03/2015 Document Revised: 11/02/2015 Document Reviewed: 07/03/2015 Elsevier Interactive Patient Education  2018 [employer].  General Anesthesia, Adult General anesthesia is the use of medicines to make a person "go to sleep" (be unconscious) for a medical procedure. General anesthesia is often recommended when a procedure:  Is long.  Requires you to be  still or in an unusual position.  Is major and can cause you to lose blood.  Is impossible to do without general anesthesia.  The medicines used for general anesthesia are called general anesthetics. In addition to making you sleep, the medicines:  Prevent pain.  Control your blood pressure.  Relax your muscles.  Tell a health care provider about:  Any allergies you have.  All medicines you are taking, including vitamins, herbs, eye drops, creams, and over-the-counter medicines.  Any problems you or family members have had with anesthetic medicines.  Types of anesthetics you have had in the past.  Any bleeding disorders you have.  Any surgeries you have had.  Any medical conditions you have.  Any history of heart or lung conditions, such as heart failure, sleep apnea, or chronic obstructive pulmonary disease (COPD).  Whether you are pregnant or may be pregnant.  Whether you use tobacco, alcohol, marijuana, or street drugs.  Any history of Financial [employer].  Any history of depression or anxiety. What are the risks? Generally, this is a safe procedure. However, problems may occur, including:  Allergic reaction to anesthetics.  Lung and heart problems.  Inhaling food or liquids from your stomach into your lungs (aspiration).  Injury to nerves.  Waking up during your procedure and being unable to move (rare).  Extreme agitation or a state of mental confusion (delirium) when you wake up from the anesthetic.  Air in the bloodstream, which can lead to stroke.  These problems are more likely to develop if you are having a major surgery or if you have an advanced medical condition. You can prevent some of these complications by answering all of your health care provider's questions thoroughly and by following all pre-procedure instructions. General anesthesia can cause side effects, including:  Nausea or vomiting  A sore throat from the breathing tube.  Feeling  cold or shivery.  Feeling tired, washed out, or achy.  Sleepiness or drowsiness.  Confusion or agitation.  What happens before the procedure? Staying hydrated Follow instructions from your health care provider about hydration, which may include:  Up to 2 hours before the procedure - you may continue to drink clear liquids, such as water, clear fruit juice, black coffee, and plain tea.  Eating and drinking restrictions Follow  instructions from your health care provider about eating and drinking, which may include:  8 hours before the procedure - stop eating heavy meals or foods such as meat, fried foods, or fatty foods.  6 hours before the procedure - stop eating light meals or foods, such as toast or cereal.  6 hours before the procedure - stop drinking milk or drinks that contain milk.  2 hours before the procedure - stop drinking clear liquids.  Medicines  Ask your health care provider about: ? Changing or stopping your regular medicines. This is especially important if you are taking diabetes medicines or blood thinners. ? Taking medicines such as aspirin and ibuprofen. These medicines can thin your blood. Do not take these medicines before your procedure if your health care provider instructs you not to. ? Taking new dietary supplements or medicines. Do not take these during the week before your procedure unless your health care provider approves them.  If you are told to take a medicine or to continue taking a medicine on the day of the procedure, take the medicine with sips of water. General instructions   Ask if you will be going home the same day, the following day, or after a longer hospital stay. ? Plan to have someone take you home. ? Plan to have someone stay with you for the first 24 hours after you leave the hospital or clinic.  For 3-6 weeks before the procedure, try not to use any tobacco products, such as cigarettes, chewing tobacco, and e-cigarettes.  You  may brush your teeth on the morning of the procedure, but make sure to spit out the toothpaste. What happens during the procedure?  You will be given anesthetics through a mask and through an IV tube in one of your veins.  You may receive medicine to help you relax (sedative).  As soon as you are asleep, a breathing tube may be used to help you breathe.  An anesthesia specialist will stay with you throughout the procedure. He or she will help keep you comfortable and safe by continuing to give you medicines and adjusting the amount of medicine that you get. He or she will also watch your blood pressure, pulse, and oxygen levels to make sure that the anesthetics do not cause any problems.  If a breathing tube was used to help you breathe, it will be removed before you wake up. The procedure may vary among health care providers and hospitals. What happens after the procedure?  You will wake up, often slowly, after the procedure is complete, usually in a recovery area.  Your blood pressure, heart rate, breathing rate, and blood oxygen level will be monitored until the medicines you were given have worn off.  You may be given medicine to help you calm down if you feel anxious or agitated.  If you will be going home the same day, your health care provider may check to make sure you can stand, drink, and urinate.  Your health care providers will treat your pain and side effects before you go home.  Do not drive for 24 hours if you received a sedative.  You may: ? Feel nauseous and vomit. ? Have a sore throat. ? Have mental slowness. ? Feel cold or shivery. ? Feel sleepy. ? Feel tired. ? Feel sore or achy, even in parts of your body where you did not have surgery. This information is not intended to replace advice given to you by your health care provider.  Make sure you discuss any questions you have with your health care provider. Document Released: 06/18/2007 Document Revised:  08/22/2015 Document Reviewed: 02/23/2015 Elsevier Interactive Patient Education  2018 [employer] General Anesthesia, Adult, Care After These instructions provide you with information about caring for yourself after your procedure. Your health care provider may also give you more specific instructions. Your treatment has been planned according to current medical practices, but problems sometimes occur. Call your health care provider if you have any problems or questions after your procedure. What can I expect after the procedure? After the procedure, it is common to have:  Vomiting.  A sore throat.  Mental slowness.  It is common to feel:  Nauseous.  Cold or shivery.  Sleepy.  Tired.  Sore or achy, even in parts of your body where you did not have surgery.  Follow these instructions at home: For at least 24 hours after the procedure:  Do not: ? Participate in activities where you could fall or become injured. ? Drive. ? Use heavy machinery. ? Drink alcohol. ? Take sleeping pills or medicines that cause drowsiness. ? Make important decisions or sign legal documents. ? Take care of children on your own.  Rest. Eating and drinking  If you vomit, drink water, juice, or soup when you can drink without vomiting.  Drink enough fluid to keep your urine clear or pale yellow.  Make sure you have little or no nausea before eating solid foods.  Follow the diet recommended by your health care provider. General instructions  Have a responsible adult stay with you until you are awake and alert.  Return to your normal activities as told by your health care provider. Ask your health care provider what activities are safe for you.  Take over-the-counter and prescription medicines only as told by your health care provider.  If you smoke, do not smoke without supervision.  Keep all follow-up visits as told by your health care provider. This is important. Contact a health care  provider if:  You continue to have nausea or vomiting at home, and medicines are not helpful.  You cannot drink fluids or start eating again.  You cannot urinate after 8-12 hours.  You develop a skin rash.  You have fever.  You have increasing redness at the site of your procedure. Get help right away if:  You have difficulty breathing.  You have chest pain.  You have unexpected bleeding.  You feel that you are having a life-threatening or urgent problem. This information is not intended to replace advice given to you by your health care provider. Make sure you discuss any questions you have with your health care provider. Document Released: 06/17/2000 Document Revised: 08/14/2015 Document Reviewed: 02/23/2015 Elsevier Interactive Patient Education  [employer]2018 [employer] Inc[employer]

## 2016-09-06 ENCOUNTER — Encounter (HOSPITAL_COMMUNITY)
Admission: RE | Admit: 2016-09-06 | Discharge: 2016-09-06 | Disposition: A | Discharge status: Home / Self Care | Payer: PPO | Source: Ambulatory Visit | Attending: Orthopedic Surgery | Admitting: Orthopedic Surgery

## 2016-09-06 ENCOUNTER — Other Ambulatory Visit: Payer: Self-pay | Admitting: *Deleted

## 2016-09-06 ENCOUNTER — Encounter (HOSPITAL_COMMUNITY): Payer: Self-pay

## 2016-09-06 ENCOUNTER — Other Ambulatory Visit: Payer: Self-pay

## 2016-09-06 DIAGNOSIS — Z96642 Presence of left artificial hip joint: Secondary | ICD-10-CM

## 2016-09-06 DIAGNOSIS — Z01818 Encounter for other preprocedural examination: Secondary | ICD-10-CM | POA: Diagnosis not present

## 2016-09-06 LAB — BASIC METABOLIC PANEL
Anion gap: 8 (ref 5–15)
BUN: 19 mg/dL (ref 6–20)
CHLORIDE: 100 mmol/L — AB (ref 101–111)
CO2: 27 mmol/L (ref 22–32)
Calcium: 8.9 mg/dL (ref 8.9–10.3)
Creatinine, Ser: 1.2 mg/dL (ref 0.61–1.24)
GFR calc non Af Amer: 60 mL/min — ABNORMAL LOW (ref >60)
Glucose, Bld: 130 mg/dL — ABNORMAL HIGH (ref 65–99)
POTASSIUM: 3.8 mmol/L (ref 3.5–5.1)
SODIUM: 135 mmol/L (ref 135–145)

## 2016-09-06 LAB — PROTIME-INR
INR: 0.98
Prothrombin Time: 13 seconds (ref 11.4–15.2)

## 2016-09-06 LAB — CBC WITH DIFFERENTIAL/PLATELET
Basophils Absolute: 0.1 10*3/uL (ref 0.0–0.1)
Basophils Relative: 1 %
Eosinophils Absolute: 0.3 10*3/uL (ref 0.0–0.7)
Eosinophils Relative: 3 %
HCT: 41.1 % (ref 39.0–52.0)
HEMOGLOBIN: 13.6 g/dL (ref 13.0–17.0)
LYMPHS ABS: 1.6 10*3/uL (ref 0.7–4.0)
LYMPHS PCT: 18 %
MCH: 30.6 pg (ref 26.0–34.0)
MCHC: 33.1 g/dL (ref 30.0–36.0)
MCV: 92.4 fL (ref 78.0–100.0)
MONOS PCT: 11 %
Monocytes Absolute: 1 10*3/uL (ref 0.1–1.0)
NEUTROS PCT: 67 %
Neutro Abs: 6.1 10*3/uL (ref 1.7–7.7)
Platelets: 239 10*3/uL (ref 150–400)
RBC: 4.45 MIL/uL (ref 4.22–5.81)
RDW: 12.9 % (ref 11.5–15.5)
WBC: 8.9 10*3/uL (ref 4.0–10.5)

## 2016-09-06 LAB — PREPARE RBC (CROSSMATCH)

## 2016-09-06 LAB — SURGICAL PCR SCREEN
MRSA, PCR: POSITIVE — AB
Staphylococcus aureus: POSITIVE — AB

## 2016-09-06 LAB — APTT: aPTT: 29 seconds (ref 24–36)

## 2016-09-06 MED ORDER — MUPIROCIN 2 % EX OINT
TOPICAL_OINTMENT | CUTANEOUS | Status: AC
  Filled 2016-09-06: qty 22

## 2016-09-09 ENCOUNTER — Other Ambulatory Visit: Payer: Self-pay | Admitting: Orthopedic Surgery

## 2016-09-09 MED ORDER — SODIUM CHLORIDE 0.9 % IV SOLN: 1000.0000 mg | Freq: Once | INTRAVENOUS | Status: DC

## 2016-09-09 NOTE — Progress Notes (Signed)
He needs vancomycin due to Positive screen
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
## 2016-09-11 ENCOUNTER — Encounter (HOSPITAL_COMMUNITY): Payer: Self-pay | Admitting: *Deleted

## 2016-09-11 ENCOUNTER — Inpatient Hospital Stay (HOSPITAL_COMMUNITY): Payer: PPO | Admitting: Anesthesiology

## 2016-09-11 ENCOUNTER — Encounter (HOSPITAL_COMMUNITY)
Admission: RE | Disposition: A | Discharge status: Home Health | Payer: Self-pay | Source: Ambulatory Visit | Attending: Orthopedic Surgery

## 2016-09-11 ENCOUNTER — Inpatient Hospital Stay (HOSPITAL_COMMUNITY)
Admission: RE | Admit: 2016-09-11 | Discharge: 2016-09-12 | DRG: 470 | Disposition: A | Discharge status: Home Health | Payer: PPO | Source: Ambulatory Visit | Attending: Orthopedic Surgery | Admitting: Orthopedic Surgery

## 2016-09-11 ENCOUNTER — Inpatient Hospital Stay (HOSPITAL_COMMUNITY): Payer: PPO

## 2016-09-11 DIAGNOSIS — I10 Essential (primary) hypertension: Secondary | ICD-10-CM | POA: Diagnosis not present

## 2016-09-11 DIAGNOSIS — Z87891 Personal history of nicotine dependence: Secondary | ICD-10-CM | POA: Diagnosis not present

## 2016-09-11 DIAGNOSIS — M1612 Unilateral primary osteoarthritis, left hip: Secondary | ICD-10-CM | POA: Diagnosis not present

## 2016-09-11 DIAGNOSIS — Z96642 Presence of left artificial hip joint: Secondary | ICD-10-CM | POA: Diagnosis not present

## 2016-09-11 DIAGNOSIS — M1652 Unilateral post-traumatic osteoarthritis, left hip: Principal | ICD-10-CM

## 2016-09-11 DIAGNOSIS — M12552 Traumatic arthropathy, left hip: Secondary | ICD-10-CM | POA: Diagnosis present

## 2016-09-11 DIAGNOSIS — Z471 Aftercare following joint replacement surgery: Secondary | ICD-10-CM | POA: Diagnosis not present

## 2016-09-11 HISTORY — PX: TOTAL HIP ARTHROPLASTY: SHX124

## 2016-09-11 SURGERY — ARTHROPLASTY, HIP, TOTAL,POSTERIOR APPROACH
Anesthesia: Spinal | Laterality: Left

## 2016-09-11 MED ORDER — PHENOL 1.4 % MT LIQD: 1.0000 | OROMUCOSAL | Status: DC | PRN

## 2016-09-11 MED ORDER — ONDANSETRON HCL 4 MG/2ML IJ SOLN: 4.0000 mg | Freq: Four times a day (QID) | INTRAMUSCULAR | Status: DC | PRN

## 2016-09-11 MED ORDER — SODIUM CHLORIDE 0.9 % IJ SOLN
INTRAMUSCULAR | Status: AC
  Filled 2016-09-11: qty 10

## 2016-09-11 MED ORDER — ONDANSETRON HCL 4 MG/2ML IJ SOLN
4.0000 mg | Freq: Four times a day (QID) | INTRAMUSCULAR | Status: DC
  Administered 2016-09-11: 4 mg via INTRAVENOUS
  Filled 2016-09-11 (×2): qty 2

## 2016-09-11 MED ORDER — DEXTROSE 5 % IV SOLN
500.0000 mg | Freq: Four times a day (QID) | INTRAVENOUS | Status: DC | PRN
  Filled 2016-09-11: qty 5

## 2016-09-11 MED ORDER — METHOCARBAMOL 500 MG PO TABS
500.0000 mg | ORAL_TABLET | Freq: Four times a day (QID) | ORAL | Status: DC | PRN
  Administered 2016-09-11: 500 mg via ORAL
  Filled 2016-09-11: qty 1

## 2016-09-11 MED ORDER — HYDROCODONE-ACETAMINOPHEN 7.5-325 MG PO TABS
1.0000 | ORAL_TABLET | Freq: Four times a day (QID) | ORAL | Status: DC
  Filled 2016-09-11: qty 1

## 2016-09-11 MED ORDER — TRANEXAMIC ACID 1000 MG/10ML IV SOLN
1000.0000 mg | INTRAVENOUS | Status: AC
  Administered 2016-09-11: 1000 mg via INTRAVENOUS
  Filled 2016-09-11: qty 10

## 2016-09-11 MED ORDER — BUPIVACAINE-EPINEPHRINE (PF) 0.5% -1:200000 IJ SOLN
INTRAMUSCULAR | Status: DC | PRN
Start: 2016-09-11 — End: 2016-09-11
  Administered 2016-09-11: 30 mL via PERINEURAL

## 2016-09-11 MED ORDER — VANCOMYCIN HCL IN DEXTROSE 1-5 GM/200ML-% IV SOLN
1000.0000 mg | Freq: Two times a day (BID) | INTRAVENOUS | Status: AC
  Administered 2016-09-11: 1000 mg via INTRAVENOUS
  Filled 2016-09-11: qty 200

## 2016-09-11 MED ORDER — METHOCARBAMOL 1000 MG/10ML IJ SOLN
500.0000 mg | Freq: Four times a day (QID) | INTRAVENOUS | Status: DC
  Administered 2016-09-11: 500 mg via INTRAVENOUS
  Filled 2016-09-11 (×8): qty 5
  Filled 2016-09-11: qty 550

## 2016-09-11 MED ORDER — FENTANYL CITRATE (PF) 100 MCG/2ML IJ SOLN: 25.0000 ug | INTRAMUSCULAR | Status: DC | PRN

## 2016-09-11 MED ORDER — ACETAMINOPHEN 500 MG PO TABS
1000.0000 mg | ORAL_TABLET | Freq: Four times a day (QID) | ORAL | Status: AC
  Administered 2016-09-11 (×2): 1000 mg via ORAL
  Filled 2016-09-11 (×3): qty 2

## 2016-09-11 MED ORDER — DEXTROSE 5 % IV SOLN
INTRAVENOUS | Status: DC | PRN
  Administered 2016-09-11: 07:00:00 via INTRAVENOUS

## 2016-09-11 MED ORDER — FENTANYL CITRATE (PF) 100 MCG/2ML IJ SOLN
INTRAMUSCULAR | Status: AC
  Filled 2016-09-11: qty 2

## 2016-09-11 MED ORDER — CELECOXIB 100 MG PO CAPS
200.0000 mg | ORAL_CAPSULE | Freq: Every day | ORAL | Status: DC
  Administered 2016-09-11: 200 mg via ORAL
  Filled 2016-09-11: qty 2

## 2016-09-11 MED ORDER — BUPIVACAINE-EPINEPHRINE (PF) 0.5% -1:200000 IJ SOLN
INTRAMUSCULAR | Status: AC
  Filled 2016-09-11: qty 30

## 2016-09-11 MED ORDER — MIDAZOLAM HCL 2 MG/2ML IJ SOLN
INTRAMUSCULAR | Status: AC
  Filled 2016-09-11: qty 2

## 2016-09-11 MED ORDER — EPHEDRINE SULFATE 50 MG/ML IJ SOLN
INTRAMUSCULAR | Status: DC | PRN
  Administered 2016-09-11: 5 mg via INTRAVENOUS
  Administered 2016-09-11 (×4): 10 mg via INTRAVENOUS
  Administered 2016-09-11: 15 mg via INTRAVENOUS

## 2016-09-11 MED ORDER — HEMOSTATIC AGENTS (NO CHARGE) OPTIME
TOPICAL | Status: DC | PRN
  Administered 2016-09-11: 1 via TOPICAL

## 2016-09-11 MED ORDER — MIDAZOLAM HCL 5 MG/5ML IJ SOLN
INTRAMUSCULAR | Status: DC | PRN
  Administered 2016-09-11: 2 mg via INTRAVENOUS

## 2016-09-11 MED ORDER — HYDROCHLOROTHIAZIDE 12.5 MG PO CAPS
12.5000 mg | ORAL_CAPSULE | ORAL | Status: DC
  Administered 2016-09-11 – 2016-09-12 (×2): 12.5 mg via ORAL
  Filled 2016-09-11 (×4): qty 1

## 2016-09-11 MED ORDER — HYDROMORPHONE HCL 1 MG/ML IJ SOLN: 0.5000 mg | INTRAMUSCULAR | Status: DC | PRN

## 2016-09-11 MED ORDER — METOCLOPRAMIDE HCL 10 MG PO TABS
5.0000 mg | ORAL_TABLET | Freq: Three times a day (TID) | ORAL | Status: DC | PRN
Start: 2016-09-11 — End: 2016-09-12

## 2016-09-11 MED ORDER — DOCUSATE SODIUM 100 MG PO CAPS
100.0000 mg | ORAL_CAPSULE | Freq: Two times a day (BID) | ORAL | Status: DC
  Administered 2016-09-11 – 2016-09-12 (×2): 100 mg via ORAL
  Filled 2016-09-11 (×3): qty 1

## 2016-09-11 MED ORDER — PREGABALIN 50 MG PO CAPS
50.0000 mg | ORAL_CAPSULE | Freq: Three times a day (TID) | ORAL | Status: DC
  Administered 2016-09-11 – 2016-09-12 (×4): 50 mg via ORAL
  Filled 2016-09-11 (×4): qty 1

## 2016-09-11 MED ORDER — BUPIVACAINE IN DEXTROSE 0.75-8.25 % IT SOLN
INTRATHECAL | Status: DC | PRN
  Administered 2016-09-11: 15 mg via INTRATHECAL

## 2016-09-11 MED ORDER — ONDANSETRON HCL 4 MG PO TABS: 4.0000 mg | ORAL_TABLET | Freq: Four times a day (QID) | ORAL | Status: DC | PRN

## 2016-09-11 MED ORDER — ALUM & MAG HYDROXIDE-SIMETH 200-200-20 MG/5ML PO SUSP
30.0000 mL | ORAL | Status: DC | PRN
Start: 2016-09-11 — End: 2016-09-12

## 2016-09-11 MED ORDER — MENTHOL 3 MG MT LOZG: 1.0000 | LOZENGE | OROMUCOSAL | Status: DC | PRN

## 2016-09-11 MED ORDER — MIDAZOLAM HCL 2 MG/2ML IJ SOLN
1.0000 mg | INTRAMUSCULAR | Status: DC
  Administered 2016-09-11: 2 mg via INTRAVENOUS

## 2016-09-11 MED ORDER — EPHEDRINE SULFATE 50 MG/ML IJ SOLN
INTRAMUSCULAR | Status: AC
Start: 2016-09-11 — End: 2016-09-11
  Filled 2016-09-11: qty 1

## 2016-09-11 MED ORDER — PROPOFOL 500 MG/50ML IV EMUL
INTRAVENOUS | Status: DC | PRN
  Administered 2016-09-11: 50 ug/kg/min via INTRAVENOUS
  Administered 2016-09-11: 09:00:00 via INTRAVENOUS

## 2016-09-11 MED ORDER — SODIUM CHLORIDE 0.9 % IV SOLN
INTRAVENOUS | Status: DC
  Administered 2016-09-11 – 2016-09-12 (×2): via INTRAVENOUS

## 2016-09-11 MED ORDER — VANCOMYCIN HCL IN DEXTROSE 1-5 GM/200ML-% IV SOLN
INTRAVENOUS | Status: AC
  Filled 2016-09-11: qty 200

## 2016-09-11 MED ORDER — FENTANYL CITRATE (PF) 100 MCG/2ML IJ SOLN
25.0000 ug | Freq: Once | INTRAMUSCULAR | Status: AC
  Administered 2016-09-11: 25 ug via INTRAVENOUS

## 2016-09-11 MED ORDER — METOCLOPRAMIDE HCL 5 MG/ML IJ SOLN
5.0000 mg | Freq: Three times a day (TID) | INTRAMUSCULAR | Status: DC | PRN
Start: 2016-09-11 — End: 2016-09-12

## 2016-09-11 MED ORDER — CHLORHEXIDINE GLUCONATE 4 % EX LIQD: 60.0000 mL | Freq: Once | CUTANEOUS | Status: DC

## 2016-09-11 MED ORDER — DEXAMETHASONE SODIUM PHOSPHATE 10 MG/ML IJ SOLN
10.0000 mg | Freq: Once | INTRAMUSCULAR | Status: DC
  Filled 2016-09-11: qty 1

## 2016-09-11 MED ORDER — BUPIVACAINE LIPOSOME 1.3 % IJ SUSP
20.0000 mL | Freq: Once | INTRAMUSCULAR | Status: DC
  Filled 2016-09-11: qty 20

## 2016-09-11 MED ORDER — KETOROLAC TROMETHAMINE 15 MG/ML IJ SOLN
15.0000 mg | Freq: Four times a day (QID) | INTRAMUSCULAR | Status: DC
  Filled 2016-09-11: qty 1

## 2016-09-11 MED ORDER — BUPIVACAINE IN DEXTROSE 0.75-8.25 % IT SOLN
INTRATHECAL | Status: AC
  Filled 2016-09-11: qty 4

## 2016-09-11 MED ORDER — MAGNESIUM CITRATE PO SOLN: 1.0000 | Freq: Once | ORAL | Status: DC | PRN

## 2016-09-11 MED ORDER — CEFAZOLIN SODIUM-DEXTROSE 2-4 GM/100ML-% IV SOLN
2.0000 g | INTRAVENOUS | Status: DC
  Filled 2016-09-11: qty 100

## 2016-09-11 MED ORDER — VANCOMYCIN HCL IN DEXTROSE 1-5 GM/200ML-% IV SOLN
1000.0000 mg | Freq: Once | INTRAVENOUS | Status: AC
  Administered 2016-09-11: 1000 mg via INTRAVENOUS

## 2016-09-11 MED ORDER — GABAPENTIN 100 MG PO CAPS: 100.0000 mg | ORAL_CAPSULE | Freq: Three times a day (TID) | ORAL | Status: DC

## 2016-09-11 MED ORDER — SODIUM CHLORIDE 0.9 % IV SOLN
INTRAVENOUS | Status: DC | PRN
  Administered 2016-09-11: 60 mL

## 2016-09-11 MED ORDER — PROPOFOL 10 MG/ML IV BOLUS
INTRAVENOUS | Status: AC
  Filled 2016-09-11: qty 40

## 2016-09-11 MED ORDER — ASPIRIN EC 325 MG PO TBEC
325.0000 mg | DELAYED_RELEASE_TABLET | Freq: Two times a day (BID) | ORAL | Status: DC
  Administered 2016-09-11 – 2016-09-12 (×2): 325 mg via ORAL
  Filled 2016-09-11 (×3): qty 1

## 2016-09-11 MED ORDER — TRAMADOL HCL 50 MG PO TABS: 50.0000 mg | ORAL_TABLET | Freq: Four times a day (QID) | ORAL | Status: DC | PRN

## 2016-09-11 MED ORDER — DIPHENHYDRAMINE HCL 12.5 MG/5ML PO ELIX: 12.5000 mg | ORAL_SOLUTION | ORAL | Status: DC | PRN

## 2016-09-11 MED ORDER — FENTANYL CITRATE (PF) 100 MCG/2ML IJ SOLN
INTRAMUSCULAR | Status: DC | PRN
  Administered 2016-09-11 (×2): 25 ug via INTRAVENOUS
  Administered 2016-09-11: 25 ug via INTRATHECAL
  Administered 2016-09-11: 25 ug via INTRAVENOUS

## 2016-09-11 MED ORDER — BUPIVACAINE LIPOSOME 1.3 % IJ SUSP
INTRAMUSCULAR | Status: AC
  Filled 2016-09-11: qty 20

## 2016-09-11 MED ORDER — SODIUM CHLORIDE 0.9 % IJ SOLN
INTRAMUSCULAR | Status: AC
  Filled 2016-09-11: qty 40

## 2016-09-11 MED ORDER — HYDROCODONE-ACETAMINOPHEN 5-325 MG PO TABS: 1.0000 | ORAL_TABLET | Freq: Once | ORAL | Status: DC

## 2016-09-11 MED ORDER — SODIUM CHLORIDE 0.9 % IR SOLN
Status: DC | PRN
  Administered 2016-09-11: 1000 mL

## 2016-09-11 MED ORDER — LACTATED RINGERS IV SOLN
INTRAVENOUS | Status: DC
  Administered 2016-09-11: 1000 mL via INTRAVENOUS
  Administered 2016-09-11 (×2): via INTRAVENOUS

## 2016-09-11 MED ORDER — CELECOXIB 100 MG PO CAPS
200.0000 mg | ORAL_CAPSULE | Freq: Two times a day (BID) | ORAL | Status: DC
  Administered 2016-09-12: 200 mg via ORAL
  Filled 2016-09-11 (×3): qty 2

## 2016-09-11 MED ORDER — PHENYLEPHRINE 40 MCG/ML (10ML) SYRINGE FOR IV PUSH (FOR BLOOD PRESSURE SUPPORT)
PREFILLED_SYRINGE | INTRAVENOUS | Status: AC
  Filled 2016-09-11: qty 10

## 2016-09-11 MED ORDER — HYDROCODONE-ACETAMINOPHEN 5-325 MG PO TABS
ORAL_TABLET | ORAL | Status: AC
  Filled 2016-09-11: qty 1

## 2016-09-11 SURGICAL SUPPLY — 70 items
BIT DRILL 2.8X128 (BIT) ×2 IMPLANT
BIT DRILL 2.8X128MM (BIT) ×1
BLADE HEX COATED 2.75 (ELECTRODE) ×3 IMPLANT
BLADE SAGITTAL 25.0X1.27X90 (BLADE) ×2 IMPLANT
BLADE SAGITTAL 25.0X1.27X90MM (BLADE) ×1
BRUSH FEMORAL CANAL (MISCELLANEOUS) IMPLANT
CAPT HIP TOTAL 2 ×2 IMPLANT
CLOTH BEACON ORANGE TIMEOUT ST (SAFETY) ×3 IMPLANT
COVER LIGHT HANDLE STERIS (MISCELLANEOUS) ×6 IMPLANT
COVER PROBE W GEL 5X96 (DRAPES) ×1 IMPLANT
DECANTER SPIKE VIAL GLASS SM (MISCELLANEOUS) ×6 IMPLANT
DRAPE BACK TABLE (DRAPES) ×3 IMPLANT
DRAPE HIP W/POCKET STRL (DRAPE) ×3 IMPLANT
DRAPE INCISE IOBAN 44X35 STRL (DRAPES) ×3 IMPLANT
DRESSING AQUACEL AG ADV 3.5X12 (MISCELLANEOUS) ×1 IMPLANT
DRSG AQUACEL AG ADV 3.5X12 (MISCELLANEOUS) ×3
DRSG MEPILEX BORDER 4X12 (GAUZE/BANDAGES/DRESSINGS) ×3 IMPLANT
DURAPREP 26ML APPLICATOR (WOUND CARE) ×6 IMPLANT
ELECT REM PT RETURN 9FT ADLT (ELECTROSURGICAL) ×3
ELECTRODE REM PT RTRN 9FT ADLT (ELECTROSURGICAL) ×1 IMPLANT
EVACUATOR 3/16  PVC DRAIN (DRAIN)
EVACUATOR 3/16 PVC DRAIN (DRAIN) IMPLANT
GLOVE BIOGEL PI IND STRL 7.0 (GLOVE) ×1 IMPLANT
GLOVE BIOGEL PI INDICATOR 7.0 (GLOVE) ×14
GLOVE OPTIFIT SS 8.0 STRL (GLOVE) ×3 IMPLANT
GLOVE SKINSENSE NS SZ8.0 LF (GLOVE) ×8
GLOVE SKINSENSE STRL SZ8.0 LF (GLOVE) ×2 IMPLANT
GLOVE SS N UNI LF 8.5 STRL (GLOVE) ×1 IMPLANT
GOWN STRL REUS W/TWL LRG LVL3 (GOWN DISPOSABLE) ×9 IMPLANT
GOWN STRL REUS W/TWL XL LVL3 (GOWN DISPOSABLE) ×3 IMPLANT
HANDPIECE INTERPULSE COAX TIP (DISPOSABLE) ×3
HEMOSTAT SURGICEL 2X3 (HEMOSTASIS) ×2 IMPLANT
HOOD W/PEELAWAY (MISCELLANEOUS) ×12 IMPLANT
INST SET MAJOR BONE (KITS) ×3 IMPLANT
IV NS IRRIG 3000ML ARTHROMATIC (IV SOLUTION) ×3 IMPLANT
KIT BLADEGUARD II DBL (SET/KITS/TRAYS/PACK) ×3 IMPLANT
KIT ROOM TURNOVER APOR (KITS) ×3 IMPLANT
MANIFOLD NEPTUNE II (INSTRUMENTS) ×3 IMPLANT
MARKER SKIN DUAL TIP RULER LAB (MISCELLANEOUS) ×3 IMPLANT
NDL HYPO 18GX1.5 BLUNT FILL (NEEDLE) ×2 IMPLANT
NDL HYPO 21X1.5 SAFETY (NEEDLE) ×1 IMPLANT
NDL HYPO 25X1 1.5 SAFETY (NEEDLE) ×1 IMPLANT
NEEDLE HYPO 18GX1.5 BLUNT FILL (NEEDLE) ×6 IMPLANT
NEEDLE HYPO 21X1.5 SAFETY (NEEDLE) ×3 IMPLANT
NEEDLE HYPO 25X1 1.5 SAFETY (NEEDLE) ×3 IMPLANT
NS IRRIG 1000ML POUR BTL (IV SOLUTION) ×3 IMPLANT
PACK TOTAL JOINT (CUSTOM PROCEDURE TRAY) ×3 IMPLANT
PAD ARMBOARD 7.5X6 YLW CONV (MISCELLANEOUS) ×3 IMPLANT
PASSER SUT SWANSON 36MM LOOP (INSTRUMENTS) IMPLANT
PILLOW HIP ABDUCTION LRG (ORTHOPEDIC SUPPLIES) IMPLANT
PILLOW HIP ABDUCTION MED (ORTHOPEDIC SUPPLIES) ×2 IMPLANT
PIN STMN SNGL STERILE 9X3.6MM (PIN) ×6 IMPLANT
SET BASIN LINEN APH (SET/KITS/TRAYS/PACK) ×3 IMPLANT
SET HNDPC FAN SPRY TIP SCT (DISPOSABLE) ×1 IMPLANT
SPONGE LAP 18X18 X RAY DECT (DISPOSABLE) ×3 IMPLANT
STAPLER VISISTAT 35W (STAPLE) ×3 IMPLANT
SUT BRALON NAB BRD #1 30IN (SUTURE) ×6 IMPLANT
SUT ETHIBOND 5 LR DA (SUTURE) ×5 IMPLANT
SUT MNCRL 0 VIOLET CTX 36 (SUTURE) ×1 IMPLANT
SUT MON AB 2-0 CT1 36 (SUTURE) ×3 IMPLANT
SUT MONOCRYL 0 CTX 36 (SUTURE)
SUT VIC AB 1 CT1 27 (SUTURE) ×6
SUT VIC AB 1 CT1 27XBRD ANTBC (SUTURE) ×2 IMPLANT
SYR 20CC LL (SYRINGE) ×9 IMPLANT
SYR 30ML LL (SYRINGE) ×3 IMPLANT
SYR BULB IRRIGATION 50ML (SYRINGE) ×3 IMPLANT
TOWEL OR 17X26 4PK STRL BLUE (TOWEL DISPOSABLE) ×3 IMPLANT
TOWER CARTRIDGE SMART MIX (DISPOSABLE) IMPLANT
TRAY FOLEY CATH SILVER 16FR (SET/KITS/TRAYS/PACK) ×3 IMPLANT
YANKAUER SUCT 12FT TUBE ARGYLE (SUCTIONS) ×3 IMPLANT

## 2016-09-11 NOTE — Addendum Note (Signed)
Addendum  created 09/11/16 1154 by Earleen NewportAdams, Latorria Zeoli A, CRNA   Anesthesia Intra Flowsheets edited
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
## 2016-09-11 NOTE — Progress Notes (Signed)
Patient requesting that foley catheter be discontinued.  Patient also reports being unable to take Hydrocodone and reports that Tramadol has worked better for him in the past.  Dr. Romeo AppleHarrison called and notified.  Dr. Romeo AppleHarrison gave order to discontinue foley catheter and may in and out cath, if unable to void after foley removal.  Dr. Romeo AppleHarrison also gave order to discontinue Hydrocodone and ordered Tramadol prn.

## 2016-09-11 NOTE — Interval H&P Note (Signed)
History and Physical Interval Note:  09/11/2016 7:21 AM  Ryan [name]  has presented today for surgery, with the diagnosis of osteoarthritis left hip  The various methods of treatment have been discussed with the patient and family. After consideration of risks, benefits and other options for treatment, the patient has consented to  Procedure(s): TOTAL HIP ARTHROPLASTY (Left) as a surgical intervention .  The patient's history has been reviewed, patient examined, no change in status, stable for surgery.  I have reviewed the patient's chart and labs.  Questions were answered to the patient's satisfaction.     [name]
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
## 2016-09-11 NOTE — Anesthesia Preprocedure Evaluation (Signed)
Anesthesia Evaluation  Patient identified by MRN, date of birth, ID band Patient awake    Reviewed: Allergy & Precautions, H&P , NPO status , Patient's Chart, lab work & pertinent test results  Airway Mallampati: II  TM Distance: >3 FB Neck ROM: Full    Dental  (+) Edentulous Upper   Pulmonary former smoker,    Pulmonary exam normal        Cardiovascular hypertension, Pt. on medications negative cardio ROS   Rhythm:Regular Rate:Normal     Neuro/Psych negative neurological ROS  negative psych ROS   GI/Hepatic negative GI ROS, Neg liver ROS,   Endo/Other  negative endocrine ROS  Renal/GU negative Renal ROS  negative genitourinary   Musculoskeletal  (+) Arthritis , Osteoarthritis,    Abdominal Normal abdominal exam  (+)   Peds  Hematology negative hematology ROS (+)   Anesthesia Other Findings   Reproductive/Obstetrics                             Anesthesia Physical Anesthesia Plan  ASA: II  Anesthesia Plan: Spinal   Post-op Pain Management:    Induction: Intravenous  PONV Risk Score and Plan:   Airway Management Planned: Simple Face Mask  Additional Equipment:   Intra-op Plan:   Post-operative Plan:   Informed Consent: I have reviewed the patients History and Physical, chart, labs and discussed the procedure including the risks, benefits and alternatives for the proposed anesthesia with the patient or authorized representative who has indicated his/her understanding and acceptance.     Plan Discussed with:   Anesthesia Plan Comments:         Anesthesia Quick Evaluation

## 2016-09-11 NOTE — Anesthesia Procedure Notes (Signed)
Spinal  Patient location during procedure: OR Start time: 09/11/2016 7:47 AM Staffing Resident/CRNA: ADAMS, AMY A Preanesthetic Checklist Completed: patient identified, site marked, surgical consent, pre-op evaluation, timeout performed, IV checked, risks and benefits discussed and monitors and equipment checked Spinal Block Patient position: left lateral decubitus Prep: Betadine Patient monitoring: heart rate, cardiac monitor, continuous pulse ox and blood pressure Approach: left paramedian Location: L3-4 Injection technique: single-shot Needle Needle type: Spinocan  Needle gauge: 22 G Needle length: 9 cm Assessment Sensory level: T8 Additional Notes  ATTEMPTS:1 TRAY NA:[phone] TRAY EXPIRATION DATE:12/23/2018

## 2016-09-11 NOTE — Anesthesia Procedure Notes (Signed)
Procedure Name: MAC Date/Time: 09/11/2016 7:30 AM Performed by: Andree Elk, AMY A Pre-anesthesia Checklist: Patient identified, Timeout performed, Emergency Drugs available, Suction available and Patient being monitored Oxygen Delivery Method: Simple face mask

## 2016-09-11 NOTE — Anesthesia Postprocedure Evaluation (Signed)
Anesthesia Post Note  Patient: Thana FarrStuart D Shimmin  Procedure(s) Performed: Procedure(s) (LRB): TOTAL HIP ARTHROPLASTY (Left)  Patient location during evaluation: PACU Anesthesia Type: Spinal Level of consciousness: awake and alert, oriented and patient cooperative Pain management: pain level controlled Vital Signs Assessment: post-procedure vital signs reviewed and stable Respiratory status: spontaneous breathing Cardiovascular status: stable Postop Assessment: no signs of nausea or vomiting Anesthetic complications: no     Last Vitals:  Vitals:   09/11/16 0715 09/11/16 1045  BP: (!) 159/87 122/78  Pulse:  (!) 137  Resp: (!) 28 (!) 3  Temp:  36.6 C    Last Pain:  Vitals:   09/11/16 0646  TempSrc: Oral                 ADAMS, AMY A

## 2016-09-11 NOTE — Transfer of Care (Signed)
Immediate Anesthesia Transfer of Care Note  Patient: Ryan [name]  Procedure(s) Performed: Procedure(s): TOTAL HIP ARTHROPLASTY (Left)  Patient Location: PACU  Anesthesia Type:Spinal  Level of Consciousness: awake, alert , oriented and patient cooperative  Airway & Oxygen Therapy: Patient Spontanous Breathing and Patient connected to nasal cannula oxygen  Post-op Assessment: Report given to RN and Post -op Vital signs reviewed and stable  Post vital signs: Reviewed and stable  Last Vitals:  Vitals:   09/11/16 0700 09/11/16 0715  BP: (!) 158/93 (!) 159/87  Resp: (!) 21 (!) 28  Temp:      Last Pain:  Vitals:   09/11/16 0646  TempSrc: Oral      Patients Stated Pain Goal: 7 (09/11/16 0646)  Complications: No apparent anesthesia complications

## 2016-09-12 ENCOUNTER — Encounter (HOSPITAL_COMMUNITY): Payer: Self-pay | Admitting: Orthopedic Surgery

## 2016-09-12 LAB — TYPE AND SCREEN
ABO/RH(D): A POS
ANTIBODY SCREEN: NEGATIVE
UNIT DIVISION: 0
Unit division: 0

## 2016-09-12 LAB — CBC
HCT: 33.1 % — ABNORMAL LOW (ref 39.0–52.0)
HEMOGLOBIN: 10.9 g/dL — AB (ref 13.0–17.0)
MCH: 30.8 pg (ref 26.0–34.0)
MCHC: 32.9 g/dL (ref 30.0–36.0)
MCV: 93.5 fL (ref 78.0–100.0)
Platelets: 209 10*3/uL (ref 150–400)
RBC: 3.54 MIL/uL — AB (ref 4.22–5.81)
RDW: 13.1 % (ref 11.5–15.5)
WBC: 11.1 10*3/uL — ABNORMAL HIGH (ref 4.0–10.5)

## 2016-09-12 LAB — BPAM RBC
BLOOD PRODUCT EXPIRATION DATE: 201806272359
Blood Product Expiration Date: 201806272359
ISSUE DATE / TIME: 201806210926
ISSUE DATE / TIME: 201806211004
UNIT TYPE AND RH: 6200
Unit Type and Rh: 6200

## 2016-09-12 LAB — BASIC METABOLIC PANEL
Anion gap: 7 (ref 5–15)
BUN: 13 mg/dL (ref 6–20)
CHLORIDE: 102 mmol/L (ref 101–111)
CO2: 26 mmol/L (ref 22–32)
Calcium: 8 mg/dL — ABNORMAL LOW (ref 8.9–10.3)
Creatinine, Ser: 1.07 mg/dL (ref 0.61–1.24)
GFR calc Af Amer: >60 mL/min (ref >60)
GFR calc non Af Amer: >60 mL/min (ref >60)
Glucose, Bld: 118 mg/dL — ABNORMAL HIGH (ref 65–99)
POTASSIUM: 4 mmol/L (ref 3.5–5.1)
SODIUM: 135 mmol/L (ref 135–145)

## 2016-09-12 MED ORDER — TRAMADOL HCL 50 MG PO TABS: 50.0000 mg | ORAL_TABLET | Freq: Four times a day (QID) | ORAL | 0 refills | Status: DC | PRN
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
## 2016-09-12 NOTE — Addendum Note (Signed)
Addendum  created 09/12/16 0947 by Earleen NewportAdams, Amy A, CRNA   Sign clinical note

## 2016-09-12 NOTE — Care Management Note (Signed)
Case Management Note  Patient Details  Name: Ryan FarrStuart D [name] MRN: [id number] Date of Birth: 1945/07/22  Subjective/Objective:                  S/p hip replacement. PT recommends HH pt ready for DC. Pt has used [facility] in the past and would like them again, aware HH has 48hrs to make first visit. Pt's wife at bedside. No DME needs.   Action/Plan: Alroy BailiffLinda Lothian, [facility], aware of referral and DC today. She will obtain pt info from chart.   Expected Discharge Date:  09/12/16               Expected Discharge Plan:  Home w Home Health Services  In-House Referral:  NA  Discharge planning Services  CM Consult  Post Acute Care Choice:  Home Health Choice offered to:  Patient  HH [facility]:  [facility]  [facility]  Status of Service:  Completed, signed off  Malcolm MetroChildress, Stephenia Vogan Demske, RN 09/12/2016, 11:07 AM

## 2016-09-12 NOTE — Progress Notes (Signed)
Patient ID: Ryan FarrStuart D Kronberg, male   DOB: 05-30-1945, 71 y.o.   MRN: [id number]  POD 1 LEFT THA  Mr Manson PasseyBrown says he feels great and would like to go home today   He has ambulated > 100 feet and his pain is controlled   His surgical dressing is dry and his neuro exam is normal   BP 117/63 (BP Location: Left Arm)   Pulse 68   Temp 98.7 F (37.1 C) (Oral)   Resp 16   SpO2 98%   CBC Latest Ref Rng & Units 09/12/2016 09/06/2016 01/25/2014  WBC 4.0 - 10.5 K/uL 11.1(H) 8.9 -  Hemoglobin 13.0 - 17.0 g/dL 10.9(L) 13.6 14.0  Hematocrit 39.0 - 52.0 % 33.1(L) 41.1 41.4  Platelets 150 - 400 K/uL 209 239 -   BMP Latest Ref Rng & Units 09/12/2016 09/06/2016 01/25/2014  Glucose 65 - 99 mg/dL 409(W118(H) 119(J130(H) 478(G136(H)  BUN 6 - 20 mg/dL 13 19 20   Creatinine 0.61 - 1.24 mg/dL 9.561.07 2.131.20 0.86(V1.71(H)  Sodium 135 - 145 mmol/L 135 135 135(L)  Potassium 3.5 - 5.1 mmol/L 4.0 3.8 5.1  Chloride 101 - 111 mmol/L 102 100(L) 97  CO2 22 - 32 mmol/L 26 27 26   Calcium 8.9 - 10.3 mg/dL 8.0(L) 8.9 9.4   S/p left tha  Stable, possible discharge today

## 2016-09-12 NOTE — Consult Note (Addendum)
   [facility]   09/12/2016  Thana FarrStuart D Garrison 01-01-1946 [id number]     [facility] NP to engage Mr. Manson PasseyBrown while [facility].   Telephone call into room to speak with Mr. Manson PasseyBrown. He gave Clinical research associatewriter permission to speak with his wife about [facility] services.   Mrs. Manson PasseyBrown is agreeable and verbal consent obtained.   Explained that Mr.Kernes will receive post hospital discharge follow up calls. Mrs. Manson PasseyBrown endorses that she remembers speaking with [facility] NP prior to surgery.   Confirmed with [facility] Care Management NP that patient will need short term telephonic RNCM follow up for transition of care.   Confirmed best contact numbers as [phone] or [phone].  Will request for Mr. Manson PasseyBrown to be assigned to [facility] RN[facility] for short term transition of care.   Will make inpatient RNCM aware.  Mr. 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   Raiford NobleAtika Kali Ambler, MSN-Ed, RN,BSN [facility] Care Management Hospital Liaison [phone]

## 2016-09-12 NOTE — Clinical Social Work Note (Signed)
Patient is going home at discharge. CM aware and making appropriate arrangement for discharge agencies.      Twain Stenseth, Juleen ChinaHeather D, LCSW

## 2016-09-12 NOTE — Progress Notes (Signed)
Patient's IV removed.  Site WNL.  AVS reviewed with patient and patient's wife.  Verbalized understanding of discharge orders, physician follow-up, medications.  [facility] to provide PT.  Patient verbalized that he has a prescription for Tramadol at home - Dr. Romeo AppleHarrison called and aware.  Patient transported by wheelchair to main entrance for discharge.  PT present and helped patient transfer to car at discharge.  Patient stable at time of discharge.

## 2016-09-12 NOTE — Discharge Summary (Signed)
Physician Discharge Summary  Patient ID: Ryan [name] MRN: [id number] DOB/AGE: 05-24-1945 71 y.o.  Admit date: 09/11/2016 Discharge date: 09/12/2016  Admission Diagnoses:  Discharge Diagnoses:  Active Problems:   Post-traumatic osteoarthritis of left hip   Traumatic arthritis of hip, left   Discharged Condition: good  Hospital Course:  09-11-2016: HD 1 LEFT THA DIRECT LATERAL APPROACH; DEPUY TRILOCK STEM, PINNACLE CUP WITH BONE GRAFT NO SCREWS-GRIPTION; 5 HO STEM 36 X 1.5 HEAD NECK   Consults: None  Significant Diagnostic Studies: labs:  CBC Latest Ref Rng & Units 09/12/2016 09/06/2016 01/25/2014  WBC 4.0 - 10.5 K/uL 11.1(H) 8.9 -  Hemoglobin 13.0 - 17.0 g/dL 10.9(L) 13.6 14.0  Hematocrit 39.0 - 52.0 % 33.1(L) 41.1 41.4  Platelets 150 - 400 K/uL 209 239 -  Discharge Exam: Blood pressure 117/63, pulse 68, temperature 98.7 F (37.1 C), temperature source Oral, resp. rate 16, SpO2 98 %. NORMAL MENTAL STATUS NORMAL WOUND  NORMAL NEURO EXAM   Disposition: 01-Home or Self Care   Allergies as of 09/12/2016      Reactions   Codeine Other (See Comments)   Knocks me out   Morphine Other (See Comments)   Passed out      Medication List    TAKE these medications   hydrochlorothiazide 12.5 MG capsule Commonly known as:  MICROZIDE Take 12.5 mg by mouth every morning.   ibuprofen 200 MG tablet Commonly known as:  ADVIL,MOTRIN Take 800 mg by mouth every 6 (six) hours as needed for mild pain or moderate pain.   traMADol 50 MG tablet Commonly known as:  ULTRAM Take 1 tablet (50 mg total) by mouth every 6 (six) hours as needed for moderate pain or severe pain.            Durable Medical Equipment        Start     Ordered   09/11/16 1230  DME Walker rolling  Once    Question:  Patient needs a walker to treat with the following condition  Answer:  History of left hip replacement   09/11/16 1229   09/11/16 1230  DME 3 n 1  Once     09/11/16 1229   09/11/16 1230  DME  Bedside commode  Once    Question:  Patient needs a bedside commode to treat with the following condition  Answer:  History of total hip replacement, left   09/11/16 1229       Signed: Fuller CanadaStanley Harrison 09/12/2016, 10:57 AM
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
## 2016-09-12 NOTE — Anesthesia Postprocedure Evaluation (Signed)
Anesthesia Post Note  Patient: Ryan [name]  Procedure(s) Performed: Procedure(s) (LRB): TOTAL HIP ARTHROPLASTY (Left)  Patient location during evaluation: Nursing Unit Anesthesia Type: Spinal Level of consciousness: awake and alert, oriented and patient cooperative Pain management: pain level controlled Vital Signs Assessment: post-procedure vital signs reviewed and stable Respiratory status: spontaneous breathing Cardiovascular status: stable Postop Assessment: no signs of nausea or vomiting Anesthetic complications: no     Last Vitals:  Vitals:   09/11/16 1754 09/12/16 0600  BP: 140/69 117/63  Pulse: 83 68  Resp: 18 16  Temp: 36.5 C 37.1 C    Last Pain:  Vitals:   09/12/16 0737  TempSrc:   PainSc: 0-No pain                 Mikiala Fugett A

## 2016-09-13 ENCOUNTER — Other Ambulatory Visit: Payer: Self-pay | Admitting: *Deleted

## 2016-09-13 NOTE — Patient Outreach (Signed)
[facility] Care Management  09/13/2016  Ryan [name] 17-Apr-1945 [id number]  Transition of Care Referral  Referral Date:09/12/16 Referral Source: Inpatient Date of Discharge: 09/12/16 [facility] Discharge Diagnosis: Total Hip Arthroplasty Insurance:  Outreach attempt # 1 spoke to patient regarding verbal consent given to hospital liaison for post discharge from [facility]. HIPAA verified with patient.He uses a RW.  Social: Patient lives with his wife. He independent with his ADLs. He is currently dependent upon his wife for transportation to medical appointments.   Conditions: Patient reported, he is doing well since being discharged from the hospital. He described having surgery on his left hip related to osteoarthritis.   Medications: Patient reported taking 2 meds per day. Patient reported being able to afford his medications and taking them as prescribed. Patient had no questions or concerns about his meds  Appointments: Patient has an upcoming appointment with PCP on 09/24/16.  Consent: [facility] reviewed and discussed with patient. Verbal consent for services was not obtained.  Plan: RN CM will [facility] assistant regarding case closure.  RN CM advised patient to contact RNCM for any needs or concerns. RN CM advised patient to alert MD for any changes in conditions.   Wynelle ClevelandJuanita Cheyanne Lamison, RN, BSN, MHA/MSL, [facility] Telephonic Care Manager Coordinator [facility] Direct Phone: [phone] Toll Free: [phone] Fax: [phone]

## 2016-09-16 ENCOUNTER — Telehealth: Payer: Self-pay | Admitting: Orthopedic Surgery

## 2016-09-16 ENCOUNTER — Other Ambulatory Visit: Payer: Self-pay | Admitting: *Deleted

## 2016-09-16 MED ORDER — TRAMADOL HCL 50 MG PO TABS: 50.0000 mg | ORAL_TABLET | Freq: Four times a day (QID) | ORAL | 0 refills | Status: DC | PRN

## 2016-09-16 NOTE — Telephone Encounter (Signed)
[facility] [place] [facility] [place] - has two questions: ([facility] status/post total hip surgery 09/11/16  (1) Asking for prescription for Tramad[facility]: notes from 09/12/16 show this medication:  traMADol (ULTRAM) 50 MG tablet 30 tablet 0 09/12/2016    Sig - Route: Take 1 tablet (50 mg total) by mouth every 6 (six) hours as needed for moderate pain or severe pain. - Oral    (2) Asking if an anti-coagulant is to be prescribed?  Her direct ph# [phone]

## 2016-09-16 NOTE — Telephone Encounter (Signed)
[name] called and said that he and his wife have some questions regarding changing his bandages.  Please call them at [phone]  Thanks

## 2016-09-16 NOTE — Telephone Encounter (Signed)
Routing to Dr. Harrison to advise 

## 2016-09-16 NOTE — Telephone Encounter (Signed)
Ryan [name] aware, tramadol faxed to [facility]

## 2016-09-16 NOTE — Telephone Encounter (Signed)
Leave bandage in place until office visit.

## 2016-09-16 NOTE — Telephone Encounter (Signed)
HE SHOULD BE ON ASPIRIN 1 325 MG PER DAY PER DISCHARGE MED LIST   TRAMADOL OK

## 2016-09-17 ENCOUNTER — Encounter: Payer: Self-pay | Admitting: *Deleted

## 2016-09-17 NOTE — Telephone Encounter (Signed)
ROUTING TO DR HARRISON TO ADVISE 

## 2016-09-17 NOTE — Telephone Encounter (Signed)
CHANGE THE BANDAGE

## 2016-09-17 NOTE — Telephone Encounter (Signed)
(  Nurse) Called back to [place],  [facility], and notified.  Patient also notified.

## 2016-09-17 NOTE — Telephone Encounter (Signed)
Patients wife aware

## 2016-09-17 NOTE — Telephone Encounter (Signed)
Notified patient and wife, as noted; however, relays that bandage is "saturated" with old blood, and looks like it needs replaced.  States hospital discharge mention to change bandage as needed.  States has on bandage "Metilex, border with Safe-technology, 10x30 cm, 4x12.  Please advise.  (patient's scheduled post op 1 visit is 09/24/16)  Patient ph# [phone]

## 2016-09-17 NOTE — Telephone Encounter (Signed)
CHANGE THE BANDAGE

## 2016-09-24 ENCOUNTER — Encounter: Payer: Self-pay | Admitting: Orthopedic Surgery

## 2016-09-24 ENCOUNTER — Ambulatory Visit (INDEPENDENT_AMBULATORY_CARE_PROVIDER_SITE_OTHER): Payer: Self-pay | Admitting: Orthopedic Surgery

## 2016-09-24 DIAGNOSIS — Z4889 Encounter for other specified surgical aftercare: Secondary | ICD-10-CM

## 2016-09-24 DIAGNOSIS — Z96642 Presence of left artificial hip joint: Secondary | ICD-10-CM

## 2016-09-24 NOTE — Progress Notes (Signed)
Chief Complaint  Patient presents with  . Follow-up    Post op #1, left hip, DOS 09-11-16.   Postop day #13  Mr. Manson PasseyBrown has no complaints cc says he is no had pain since surgery in his back pain is improved  His home health therapist released and small therapy  He can start driving he can take his TED hose off he is advised to do no strenuous activity over the next 6 weeks at which time he will follow-up for second postop visit  Encounter Diagnoses  Name Primary?  Marland Kitchen. Aftercare following surgery Yes  . History of left hip replacement

## 2016-10-01 DIAGNOSIS — Z1211 Encounter for screening for malignant neoplasm of colon: Secondary | ICD-10-CM | POA: Diagnosis not present

## 2016-10-31 DIAGNOSIS — Z1389 Encounter for screening for other disorder: Secondary | ICD-10-CM | POA: Diagnosis not present

## 2016-10-31 DIAGNOSIS — Z6829 Body mass index (BMI) 29.0-29.9, adult: Secondary | ICD-10-CM | POA: Diagnosis not present

## 2016-10-31 DIAGNOSIS — E782 Mixed hyperlipidemia: Secondary | ICD-10-CM | POA: Diagnosis not present

## 2016-10-31 DIAGNOSIS — N401 Enlarged prostate with lower urinary tract symptoms: Secondary | ICD-10-CM | POA: Diagnosis not present

## 2016-10-31 DIAGNOSIS — E663 Overweight: Secondary | ICD-10-CM | POA: Diagnosis not present

## 2016-11-05 ENCOUNTER — Ambulatory Visit (INDEPENDENT_AMBULATORY_CARE_PROVIDER_SITE_OTHER): Payer: PPO | Admitting: Orthopedic Surgery

## 2016-11-05 DIAGNOSIS — Z96642 Presence of left artificial hip joint: Secondary | ICD-10-CM

## 2016-11-05 DIAGNOSIS — Z4889 Encounter for other specified surgical aftercare: Secondary | ICD-10-CM

## 2016-11-05 NOTE — Progress Notes (Signed)
Routine follow-up status post left total hip  Surgery date 09/11/2016  Postop week #8  He has no complaints he is ready to mow his lawn  His hip flexion was 110 his wound looked good. His leg lengths were able. He did have a Trendelenburg gait from weakness of his abductors but that has been long-standing and is probably more related to his original surgery after trauma  Encounter Diagnoses  Name Primary?  Marland Kitchen. Aftercare following surgery Yes  . History of left hip replacement      Follow-up in a month for his routine annual knee follow-up

## 2016-11-19 DIAGNOSIS — D649 Anemia, unspecified: Secondary | ICD-10-CM | POA: Diagnosis not present

## 2016-11-19 DIAGNOSIS — E748 Other specified disorders of carbohydrate metabolism: Secondary | ICD-10-CM | POA: Diagnosis not present

## 2016-11-19 DIAGNOSIS — Z6829 Body mass index (BMI) 29.0-29.9, adult: Secondary | ICD-10-CM | POA: Diagnosis not present

## 2016-11-19 DIAGNOSIS — M1991 Primary osteoarthritis, unspecified site: Secondary | ICD-10-CM | POA: Diagnosis not present

## 2016-11-19 DIAGNOSIS — N400 Enlarged prostate without lower urinary tract symptoms: Secondary | ICD-10-CM | POA: Diagnosis not present

## 2016-12-10 ENCOUNTER — Other Ambulatory Visit: Payer: Self-pay | Admitting: Radiology

## 2016-12-10 ENCOUNTER — Ambulatory Visit: Payer: PPO | Admitting: Orthopedic Surgery

## 2016-12-10 DIAGNOSIS — Z96653 Presence of artificial knee joint, bilateral: Secondary | ICD-10-CM

## 2016-12-11 ENCOUNTER — Encounter: Payer: Self-pay | Admitting: Orthopedic Surgery

## 2016-12-11 ENCOUNTER — Ambulatory Visit (INDEPENDENT_AMBULATORY_CARE_PROVIDER_SITE_OTHER): Payer: PPO

## 2016-12-11 ENCOUNTER — Ambulatory Visit (INDEPENDENT_AMBULATORY_CARE_PROVIDER_SITE_OTHER): Payer: PPO | Admitting: Orthopedic Surgery

## 2016-12-11 ENCOUNTER — Ambulatory Visit: Payer: PPO

## 2016-12-11 VITALS — BP 134/91 | HR 68 | Resp 16 | Ht 68.0 in | Wt 195.0 lb

## 2016-12-11 DIAGNOSIS — Z96653 Presence of artificial knee joint, bilateral: Secondary | ICD-10-CM

## 2016-12-11 DIAGNOSIS — Z96642 Presence of left artificial hip joint: Secondary | ICD-10-CM
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
## 2016-12-13 DIAGNOSIS — Z6829 Body mass index (BMI) 29.0-29.9, adult: Secondary | ICD-10-CM | POA: Diagnosis not present

## 2016-12-13 DIAGNOSIS — D509 Iron deficiency anemia, unspecified: Secondary | ICD-10-CM | POA: Diagnosis not present

## 2016-12-13 DIAGNOSIS — E611 Iron deficiency: Secondary | ICD-10-CM | POA: Diagnosis not present

## 2016-12-13 DIAGNOSIS — Z1211 Encounter for screening for malignant neoplasm of colon: Secondary | ICD-10-CM | POA: Diagnosis not present

## 2016-12-13 DIAGNOSIS — N400 Enlarged prostate without lower urinary tract symptoms: Secondary | ICD-10-CM | POA: Diagnosis not present

## 2016-12-13 DIAGNOSIS — E063 Autoimmune thyroiditis: Secondary | ICD-10-CM | POA: Diagnosis not present

## 2016-12-13 DIAGNOSIS — R311 Benign essential microscopic hematuria: Secondary | ICD-10-CM | POA: Diagnosis not present

## 2017-01-13 ENCOUNTER — Ambulatory Visit (INDEPENDENT_AMBULATORY_CARE_PROVIDER_SITE_OTHER): Payer: PPO | Admitting: Otolaryngology

## 2017-01-13 DIAGNOSIS — R040 Epistaxis: Secondary | ICD-10-CM | POA: Diagnosis not present

## 2017-03-31 DIAGNOSIS — M205X1 Other deformities of toe(s) (acquired), right foot: Secondary | ICD-10-CM | POA: Diagnosis not present

## 2017-03-31 DIAGNOSIS — M79673 Pain in unspecified foot: Secondary | ICD-10-CM | POA: Diagnosis not present

## 2017-04-16 DIAGNOSIS — Z6830 Body mass index (BMI) 30.0-30.9, adult: Secondary | ICD-10-CM | POA: Diagnosis not present

## 2017-04-16 DIAGNOSIS — I10 Essential (primary) hypertension: Secondary | ICD-10-CM | POA: Diagnosis not present

## 2017-04-16 DIAGNOSIS — Z1389 Encounter for screening for other disorder: Secondary | ICD-10-CM | POA: Diagnosis not present

## 2017-04-16 DIAGNOSIS — M1991 Primary osteoarthritis, unspecified site: Secondary | ICD-10-CM | POA: Diagnosis not present

## 2017-04-16 DIAGNOSIS — E782 Mixed hyperlipidemia: Secondary | ICD-10-CM | POA: Diagnosis not present

## 2017-04-16 DIAGNOSIS — Z0001 Encounter for general adult medical examination with abnormal findings: Secondary | ICD-10-CM | POA: Diagnosis not present

## 2017-04-16 DIAGNOSIS — R011 Cardiac murmur, unspecified: Secondary | ICD-10-CM | POA: Diagnosis not present

## 2017-04-16 DIAGNOSIS — N400 Enlarged prostate without lower urinary tract symptoms: Secondary | ICD-10-CM | POA: Diagnosis not present

## 2017-06-26 ENCOUNTER — Ambulatory Visit (INDEPENDENT_AMBULATORY_CARE_PROVIDER_SITE_OTHER): Payer: PPO | Admitting: Otolaryngology

## 2017-06-26 DIAGNOSIS — R040 Epistaxis: Secondary | ICD-10-CM

## 2017-07-24 ENCOUNTER — Ambulatory Visit (INDEPENDENT_AMBULATORY_CARE_PROVIDER_SITE_OTHER): Payer: PPO | Admitting: Otolaryngology

## 2017-08-04 ENCOUNTER — Ambulatory Visit (INDEPENDENT_AMBULATORY_CARE_PROVIDER_SITE_OTHER): Payer: PPO | Admitting: Otolaryngology

## 2017-08-04 DIAGNOSIS — R040 Epistaxis: Secondary | ICD-10-CM

## 2017-09-17 ENCOUNTER — Ambulatory Visit: Payer: Self-pay | Admitting: Orthopedic Surgery

## 2017-09-19 DIAGNOSIS — Z96652 Presence of left artificial knee joint: Secondary | ICD-10-CM | POA: Insufficient documentation

## 2017-09-19 DIAGNOSIS — Z96651 Presence of right artificial knee joint: Secondary | ICD-10-CM | POA: Insufficient documentation

## 2017-09-19 DIAGNOSIS — Z96642 Presence of left artificial hip joint: Secondary | ICD-10-CM | POA: Insufficient documentation

## 2017-09-22 ENCOUNTER — Ambulatory Visit (INDEPENDENT_AMBULATORY_CARE_PROVIDER_SITE_OTHER): Payer: PPO

## 2017-09-22 ENCOUNTER — Ambulatory Visit: Payer: PPO | Admitting: Orthopedic Surgery

## 2017-09-22 VITALS — BP 126/78 | HR 74 | Ht 68.0 in | Wt 196.0 lb

## 2017-09-22 DIAGNOSIS — Z96642 Presence of left artificial hip joint: Secondary | ICD-10-CM

## 2017-09-22 DIAGNOSIS — Z96651 Presence of right artificial knee joint: Secondary | ICD-10-CM | POA: Diagnosis not present

## 2017-09-22 DIAGNOSIS — Z96652 Presence of left artificial knee joint: Secondary | ICD-10-CM | POA: Diagnosis not present
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
## 2017-10-13 DIAGNOSIS — Z6829 Body mass index (BMI) 29.0-29.9, adult: Secondary | ICD-10-CM | POA: Diagnosis not present

## 2017-10-13 DIAGNOSIS — J209 Acute bronchitis, unspecified: Secondary | ICD-10-CM | POA: Diagnosis not present

## 2017-10-13 DIAGNOSIS — E663 Overweight: Secondary | ICD-10-CM | POA: Diagnosis not present

## 2017-11-13 DIAGNOSIS — R479 Unspecified speech disturbances: Secondary | ICD-10-CM | POA: Diagnosis not present

## 2017-11-13 DIAGNOSIS — R0989 Other specified symptoms and signs involving the circulatory and respiratory systems: Secondary | ICD-10-CM | POA: Diagnosis not present

## 2017-11-13 DIAGNOSIS — E663 Overweight: Secondary | ICD-10-CM | POA: Diagnosis not present

## 2017-11-13 DIAGNOSIS — R498 Other voice and resonance disorders: Secondary | ICD-10-CM | POA: Diagnosis not present

## 2017-11-13 DIAGNOSIS — Z6829 Body mass index (BMI) 29.0-29.9, adult: Secondary | ICD-10-CM | POA: Diagnosis not present

## 2017-12-01 DIAGNOSIS — R0989 Other specified symptoms and signs involving the circulatory and respiratory systems: Secondary | ICD-10-CM | POA: Diagnosis not present

## 2017-12-01 DIAGNOSIS — I700 Atherosclerosis of aorta: Secondary | ICD-10-CM | POA: Diagnosis not present

## 2017-12-01 DIAGNOSIS — I6523 Occlusion and stenosis of bilateral carotid arteries: Secondary | ICD-10-CM | POA: Diagnosis not present

## 2017-12-01 DIAGNOSIS — R498 Other voice and resonance disorders: Secondary | ICD-10-CM | POA: Diagnosis not present

## 2017-12-09 DIAGNOSIS — I6529 Occlusion and stenosis of unspecified carotid artery: Secondary | ICD-10-CM | POA: Diagnosis not present

## 2017-12-09 DIAGNOSIS — R0602 Shortness of breath: Secondary | ICD-10-CM | POA: Diagnosis not present

## 2017-12-09 DIAGNOSIS — R011 Cardiac murmur, unspecified: Secondary | ICD-10-CM | POA: Diagnosis not present

## 2017-12-09 DIAGNOSIS — Z6829 Body mass index (BMI) 29.0-29.9, adult: Secondary | ICD-10-CM | POA: Diagnosis not present

## 2017-12-09 DIAGNOSIS — E663 Overweight: Secondary | ICD-10-CM | POA: Diagnosis not present

## 2017-12-10 ENCOUNTER — Other Ambulatory Visit (HOSPITAL_COMMUNITY): Payer: Self-pay | Admitting: Physician Assistant

## 2017-12-10 ENCOUNTER — Ambulatory Visit (HOSPITAL_COMMUNITY)
Admission: RE | Admit: 2017-12-10 | Discharge: 2017-12-10 | Disposition: A | Discharge status: Home / Self Care | Payer: PPO | Source: Ambulatory Visit | Attending: Physician Assistant | Admitting: Physician Assistant

## 2017-12-10 DIAGNOSIS — R0602 Shortness of breath: Secondary | ICD-10-CM

## 2017-12-19 DIAGNOSIS — R011 Cardiac murmur, unspecified: Secondary | ICD-10-CM | POA: Diagnosis not present

## 2017-12-19 DIAGNOSIS — I6523 Occlusion and stenosis of bilateral carotid arteries: Secondary | ICD-10-CM | POA: Diagnosis not present

## 2017-12-19 DIAGNOSIS — Z6829 Body mass index (BMI) 29.0-29.9, adult: Secondary | ICD-10-CM | POA: Diagnosis not present

## 2017-12-19 DIAGNOSIS — I6529 Occlusion and stenosis of unspecified carotid artery: Secondary | ICD-10-CM | POA: Diagnosis not present

## 2017-12-19 DIAGNOSIS — R0602 Shortness of breath: Secondary | ICD-10-CM | POA: Diagnosis not present

## 2018-01-21 ENCOUNTER — Encounter: Payer: Self-pay | Admitting: Cardiology

## 2018-01-21 ENCOUNTER — Ambulatory Visit: Payer: PPO | Admitting: Cardiology

## 2018-01-21 VITALS — BP 142/86 | HR 75 | Ht 68.0 in | Wt 195.0 lb

## 2018-01-21 DIAGNOSIS — R011 Cardiac murmur, unspecified: Secondary | ICD-10-CM | POA: Diagnosis not present

## 2018-01-21 DIAGNOSIS — I779 Disorder of arteries and arterioles, unspecified: Secondary | ICD-10-CM

## 2018-01-21 DIAGNOSIS — I739 Peripheral vascular disease, unspecified: Secondary | ICD-10-CM

## 2018-01-21 DIAGNOSIS — I10 Essential (primary) hypertension: Secondary | ICD-10-CM

## 2018-01-21 DIAGNOSIS — R0609 Other forms of dyspnea: Secondary | ICD-10-CM
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
## 2018-01-21 NOTE — Patient Instructions (Signed)
Medication Instructions:  Your physician recommends that you continue on your current medications as directed. Please refer to the Current Medication list given to you today.  If you need a refill on your cardiac medications before your next appointment, please call your pharmacy.   Lab work: none If you have labs (blood work) drawn today and your tests are completely normal, you will receive your results only by: Marland Kitchen MyChart Message (if you have MyChart) OR . A paper copy in the mail If you have any lab test that is abnormal or we need to change your treatment, we will call you to review the results.  Testing/Procedures: Your physician has requested that you have an echocardiogram. Echocardiography is a painless test that uses sound waves to create images of your heart. It provides your doctor with information about the size and shape of your heart and how well your heart's chambers and valves are working. This procedure takes approximately one hour. There are no restrictions for this procedure.  Your physician has requested that you have a lexiscan myoview. For further information please visit [email or web address] Please follow instruction sheet, as given.   Follow-Up:We will call you with results.  Any Other Special Instructions Will Be Listed Below (If Applicable). None

## 2018-01-28 ENCOUNTER — Encounter (HOSPITAL_COMMUNITY)
Admission: RE | Admit: 2018-01-28 | Discharge: 2018-01-28 | Disposition: A | Discharge status: Home / Self Care | Payer: PPO | Source: Ambulatory Visit | Attending: Cardiology | Admitting: Cardiology

## 2018-01-28 ENCOUNTER — Encounter (HOSPITAL_BASED_OUTPATIENT_CLINIC_OR_DEPARTMENT_OTHER)
Admission: RE | Admit: 2018-01-28 | Discharge: 2018-01-28 | Disposition: A | Discharge status: Home / Self Care | Payer: PPO | Source: Ambulatory Visit | Attending: Cardiology | Admitting: Cardiology

## 2018-01-28 ENCOUNTER — Ambulatory Visit (HOSPITAL_COMMUNITY)
Admission: RE | Admit: 2018-01-28 | Discharge: 2018-01-28 | Disposition: A | Discharge status: Home / Self Care | Payer: PPO | Source: Ambulatory Visit | Attending: Cardiology | Admitting: Cardiology

## 2018-01-28 DIAGNOSIS — R011 Cardiac murmur, unspecified: Secondary | ICD-10-CM | POA: Insufficient documentation

## 2018-01-28 DIAGNOSIS — R0609 Other forms of dyspnea: Secondary | ICD-10-CM | POA: Diagnosis not present

## 2018-01-28 LAB — NM MYOCAR MULTI W/SPECT W/WALL MOTION / EF
CHL CUP NUCLEAR SRS: 2
CHL CUP NUCLEAR SSS: 5
CHL CUP RESTING HR STRESS: 63 {beats}/min
CSEPPHR: 96 {beats}/min
LV dias vol: 114 mL (ref 62–150)
LV sys vol: 52 mL
RATE: 0.38
SDS: 3
TID: 1.18

## 2018-01-28 MED ORDER — TECHNETIUM TC 99M TETROFOSMIN IV KIT
10.0000 | PACK | Freq: Once | INTRAVENOUS | Status: AC | PRN
  Administered 2018-01-28: 11 via INTRAVENOUS

## 2018-01-28 MED ORDER — SODIUM CHLORIDE 0.9% FLUSH
INTRAVENOUS | Status: AC
  Administered 2018-01-28: 10 mL via INTRAVENOUS
  Filled 2018-01-28: qty 10

## 2018-01-28 MED ORDER — TECHNETIUM TC 99M TETROFOSMIN IV KIT
30.0000 | PACK | Freq: Once | INTRAVENOUS | Status: AC | PRN
  Administered 2018-01-28: 32 via INTRAVENOUS

## 2018-01-28 MED ORDER — REGADENOSON 0.4 MG/5ML IV SOLN
INTRAVENOUS | Status: AC
  Administered 2018-01-28: 0.4 mg via INTRAVENOUS
  Filled 2018-01-28: qty 5

## 2018-01-28 NOTE — Progress Notes (Signed)
*  PRELIMINARY RESULTS* Echocardiogram 2D Echocardiogram has been performed.  Stacey Drain 01/28/2018, 9:12 AM

## 2018-04-27 DIAGNOSIS — Z1389 Encounter for screening for other disorder: Secondary | ICD-10-CM | POA: Diagnosis not present

## 2018-04-27 DIAGNOSIS — E7849 Other hyperlipidemia: Secondary | ICD-10-CM | POA: Diagnosis not present

## 2018-04-27 DIAGNOSIS — Z6829 Body mass index (BMI) 29.0-29.9, adult: Secondary | ICD-10-CM | POA: Diagnosis not present

## 2018-04-27 DIAGNOSIS — E063 Autoimmune thyroiditis: Secondary | ICD-10-CM | POA: Diagnosis not present

## 2018-04-27 DIAGNOSIS — Z0000 Encounter for general adult medical examination without abnormal findings: Secondary | ICD-10-CM | POA: Diagnosis not present

## 2018-04-27 DIAGNOSIS — E663 Overweight: Secondary | ICD-10-CM | POA: Diagnosis not present

## 2018-09-23 ENCOUNTER — Encounter: Payer: Self-pay | Admitting: Orthopedic Surgery

## 2018-09-23 ENCOUNTER — Other Ambulatory Visit: Payer: Self-pay

## 2018-09-23 ENCOUNTER — Ambulatory Visit (INDEPENDENT_AMBULATORY_CARE_PROVIDER_SITE_OTHER): Payer: PPO | Admitting: Orthopedic Surgery

## 2018-09-23 ENCOUNTER — Ambulatory Visit (INDEPENDENT_AMBULATORY_CARE_PROVIDER_SITE_OTHER): Payer: PPO

## 2018-09-23 VITALS — BP 176/98 | HR 88 | Temp 98.5°F | Ht 68.0 in | Wt 199.0 lb

## 2018-09-23 DIAGNOSIS — S76012A Strain of muscle, fascia and tendon of left hip, initial encounter: Secondary | ICD-10-CM | POA: Diagnosis not present

## 2018-09-23 DIAGNOSIS — Z96642 Presence of left artificial hip joint: Secondary | ICD-10-CM | POA: Diagnosis not present

## 2018-09-23 NOTE — Patient Instructions (Signed)
Cane left hand for 6 weeks   No strenuous activity for 6 weeks
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
## 2018-11-21 DIAGNOSIS — Z1211 Encounter for screening for malignant neoplasm of colon: Secondary | ICD-10-CM | POA: Diagnosis not present

## 2018-11-25 ENCOUNTER — Ambulatory Visit (INDEPENDENT_AMBULATORY_CARE_PROVIDER_SITE_OTHER): Payer: PPO | Admitting: Orthopedic Surgery

## 2018-11-25 ENCOUNTER — Other Ambulatory Visit: Payer: Self-pay

## 2018-11-25 ENCOUNTER — Encounter: Payer: Self-pay | Admitting: Orthopedic Surgery

## 2018-11-25 VITALS — BP 166/89 | HR 79 | Ht 68.0 in | Wt 199.0 lb

## 2018-11-25 DIAGNOSIS — Z96642 Presence of left artificial hip joint: Secondary | ICD-10-CM | POA: Diagnosis not present

## 2018-11-25 DIAGNOSIS — S76012A Strain of muscle, fascia and tendon of left hip, initial encounter: Secondary | ICD-10-CM | POA: Diagnosis not present

## 2018-11-25 NOTE — Progress Notes (Signed)
Progress Note   Patient ID: Ryan [name], male   DOB: September 08, 1945, 73 y.o.   MRN: [id number]   Chief Complaint  Patient presents with  . Hip Pain    left/ feels better     Encounter Diagnoses  Name Primary?  . Tear of left gluteus medius tendon Yes  . S/P hip replacement, left 09/11/16     73 year old male had a left gluteus medius tear feels better no pain at this time    ROS    BP (!) 166/89   Pulse 79   Ht 5\' 8"  (1.727 m)   Wt 199 lb (90.3 kg)   BMI 30.26 kg/m   Physical Exam  Normal range of motion in the hip no tenderness Medical decisions:  (Established problem worse, x-ray ,physical therapy, over-the-counter medicines, read outside film or summarize x-ray)  Data  Imaging:   None  Encounter Diagnoses  Name Primary?  . Tear of left gluteus medius tendon Yes  . S/P hip replacement, left 09/11/16     PLAN:   Resume yearly x-ray schedule    Arther Abbott, MD 11/25/2018 9:47 AM

## 2019-04-25 DIAGNOSIS — I129 Hypertensive chronic kidney disease with stage 1 through stage 4 chronic kidney disease, or unspecified chronic kidney disease: Secondary | ICD-10-CM | POA: Diagnosis not present

## 2019-04-25 DIAGNOSIS — N183 Chronic kidney disease, stage 3 unspecified: Secondary | ICD-10-CM | POA: Diagnosis not present

## 2019-04-25 DIAGNOSIS — M1991 Primary osteoarthritis, unspecified site: Secondary | ICD-10-CM | POA: Diagnosis not present

## 2019-04-25 DIAGNOSIS — E063 Autoimmune thyroiditis: Secondary | ICD-10-CM | POA: Diagnosis not present

## 2019-06-16 DIAGNOSIS — E063 Autoimmune thyroiditis: Secondary | ICD-10-CM | POA: Diagnosis not present

## 2019-06-16 DIAGNOSIS — Z0000 Encounter for general adult medical examination without abnormal findings: Secondary | ICD-10-CM | POA: Diagnosis not present

## 2019-06-16 DIAGNOSIS — Z6829 Body mass index (BMI) 29.0-29.9, adult: Secondary | ICD-10-CM | POA: Diagnosis not present

## 2019-06-16 DIAGNOSIS — E782 Mixed hyperlipidemia: Secondary | ICD-10-CM | POA: Diagnosis not present

## 2019-06-16 DIAGNOSIS — Z1389 Encounter for screening for other disorder: Secondary | ICD-10-CM | POA: Diagnosis not present

## 2019-06-16 DIAGNOSIS — E663 Overweight: Secondary | ICD-10-CM | POA: Diagnosis not present

## 2019-06-23 DIAGNOSIS — M1991 Primary osteoarthritis, unspecified site: Secondary | ICD-10-CM | POA: Diagnosis not present

## 2019-06-23 DIAGNOSIS — E063 Autoimmune thyroiditis: Secondary | ICD-10-CM | POA: Diagnosis not present

## 2019-06-23 DIAGNOSIS — I129 Hypertensive chronic kidney disease with stage 1 through stage 4 chronic kidney disease, or unspecified chronic kidney disease: Secondary | ICD-10-CM | POA: Diagnosis not present

## 2019-06-23 DIAGNOSIS — N183 Chronic kidney disease, stage 3 unspecified: Secondary | ICD-10-CM | POA: Diagnosis not present

## 2019-07-23 DIAGNOSIS — N183 Chronic kidney disease, stage 3 unspecified: Secondary | ICD-10-CM | POA: Diagnosis not present

## 2019-07-23 DIAGNOSIS — I129 Hypertensive chronic kidney disease with stage 1 through stage 4 chronic kidney disease, or unspecified chronic kidney disease: Secondary | ICD-10-CM | POA: Diagnosis not present

## 2019-07-23 DIAGNOSIS — E7849 Other hyperlipidemia: Secondary | ICD-10-CM | POA: Diagnosis not present

## 2019-07-23 DIAGNOSIS — E063 Autoimmune thyroiditis: Secondary | ICD-10-CM | POA: Diagnosis not present

## 2019-08-23 DIAGNOSIS — E7849 Other hyperlipidemia: Secondary | ICD-10-CM | POA: Diagnosis not present

## 2019-08-23 DIAGNOSIS — I129 Hypertensive chronic kidney disease with stage 1 through stage 4 chronic kidney disease, or unspecified chronic kidney disease: Secondary | ICD-10-CM | POA: Diagnosis not present

## 2019-08-23 DIAGNOSIS — E063 Autoimmune thyroiditis: Secondary | ICD-10-CM | POA: Diagnosis not present

## 2019-08-23 DIAGNOSIS — N183 Chronic kidney disease, stage 3 unspecified: Secondary | ICD-10-CM | POA: Diagnosis not present

## 2019-10-05 DIAGNOSIS — Z0000 Encounter for general adult medical examination without abnormal findings: Secondary | ICD-10-CM | POA: Diagnosis not present

## 2019-10-05 DIAGNOSIS — N400 Enlarged prostate without lower urinary tract symptoms: Secondary | ICD-10-CM | POA: Diagnosis not present

## 2019-10-11 ENCOUNTER — Ambulatory Visit: Payer: PPO | Admitting: Orthopedic Surgery

## 2019-10-13 ENCOUNTER — Other Ambulatory Visit: Payer: Self-pay

## 2019-10-13 ENCOUNTER — Encounter: Payer: Self-pay | Admitting: Orthopedic Surgery

## 2019-10-13 ENCOUNTER — Ambulatory Visit: Payer: PPO

## 2019-10-13 ENCOUNTER — Ambulatory Visit (INDEPENDENT_AMBULATORY_CARE_PROVIDER_SITE_OTHER): Payer: PPO | Admitting: Orthopedic Surgery

## 2019-10-13 VITALS — BP 155/95 | HR 79 | Ht 68.0 in | Wt 198.0 lb

## 2019-10-13 DIAGNOSIS — Z96642 Presence of left artificial hip joint: Secondary | ICD-10-CM | POA: Diagnosis not present

## 2019-10-13 DIAGNOSIS — Z96651 Presence of right artificial knee joint: Secondary | ICD-10-CM

## 2019-10-13 DIAGNOSIS — Z96652 Presence of left artificial knee joint: Secondary | ICD-10-CM | POA: Diagnosis not present

## 2019-10-13 DIAGNOSIS — M1612 Unilateral primary osteoarthritis, left hip: Secondary | ICD-10-CM | POA: Diagnosis not present

## 2019-10-13 NOTE — Progress Notes (Signed)
FOLLOW UP VISIT   Patient ID: Ryan [name], male   DOB: [birth year], 74 y.o.   MRN: [id number]  Body mass index is 30.11 kg/m.  Chief Complaint  Patient presents with  . Post-op Follow-up    left hip replacement 09/11/16     BP (!) 155/95   Pulse 79   Ht 5\' 8"  (1.727 m)   Wt 198 lb (89.8 kg)   BMI 30.11 kg/m   Body mass index is 30.11 kg/m.      HPI Ryan [name] is a 74 y.o. male.   74 year old male status post left total hip in 2018 had a history of a prior injury to the left pelvis back in the 70s had some surgery back then eventually develop arthritis of the hip  No complaints of the hip   2018 [facility]   Review of Systems ROS  Back and joint pain    Physical Exam  Trendelenburg gait, normal range of motion without pain   MEDICAL DECISION MAKING  DATA   X-ray shows stable prosthesis no loosening or wear  Encounter Diagnoses  Name Primary?  . S/P total knee replacement, right 10/27/13 Yes  . S/P total knee replacement, left 09/02/06   . S/P hip replacement, left 09/11/16   . Unilateral primary osteoarthritis, left hip       PLAN    X-ray year for 1 year

## 2019-10-18 DIAGNOSIS — Z681 Body mass index (BMI) 19 or less, adult: Secondary | ICD-10-CM | POA: Diagnosis not present

## 2019-10-18 DIAGNOSIS — J209 Acute bronchitis, unspecified: Secondary | ICD-10-CM | POA: Diagnosis not present

## 2019-10-22 DIAGNOSIS — I129 Hypertensive chronic kidney disease with stage 1 through stage 4 chronic kidney disease, or unspecified chronic kidney disease: Secondary | ICD-10-CM | POA: Diagnosis not present

## 2019-10-22 DIAGNOSIS — N182 Chronic kidney disease, stage 2 (mild): Secondary | ICD-10-CM | POA: Diagnosis not present

## 2019-10-22 DIAGNOSIS — E063 Autoimmune thyroiditis: Secondary | ICD-10-CM | POA: Diagnosis not present

## 2019-10-22 DIAGNOSIS — E7849 Other hyperlipidemia: Secondary | ICD-10-CM | POA: Diagnosis not present

## 2019-12-23 DIAGNOSIS — E063 Autoimmune thyroiditis: Secondary | ICD-10-CM | POA: Diagnosis not present

## 2019-12-23 DIAGNOSIS — I129 Hypertensive chronic kidney disease with stage 1 through stage 4 chronic kidney disease, or unspecified chronic kidney disease: Secondary | ICD-10-CM | POA: Diagnosis not present

## 2019-12-23 DIAGNOSIS — M1991 Primary osteoarthritis, unspecified site: Secondary | ICD-10-CM | POA: Diagnosis not present

## 2019-12-23 DIAGNOSIS — E7849 Other hyperlipidemia: Secondary | ICD-10-CM | POA: Diagnosis not present

## 2020-03-14 ENCOUNTER — Ambulatory Visit (HOSPITAL_COMMUNITY)
Admission: RE | Admit: 2020-03-14 | Discharge: 2020-03-14 | Disposition: A | Discharge status: Home / Self Care | Payer: PPO | Source: Ambulatory Visit | Attending: Physician Assistant | Admitting: Physician Assistant

## 2020-03-14 ENCOUNTER — Other Ambulatory Visit (HOSPITAL_COMMUNITY): Payer: Self-pay | Admitting: Physician Assistant

## 2020-03-14 ENCOUNTER — Other Ambulatory Visit: Payer: Self-pay

## 2020-03-14 DIAGNOSIS — R059 Cough, unspecified: Secondary | ICD-10-CM | POA: Diagnosis not present

## 2020-03-14 DIAGNOSIS — I351 Nonrheumatic aortic (valve) insufficiency: Secondary | ICD-10-CM | POA: Diagnosis not present

## 2020-03-14 DIAGNOSIS — Z6831 Body mass index (BMI) 31.0-31.9, adult: Secondary | ICD-10-CM | POA: Diagnosis not present

## 2020-03-14 DIAGNOSIS — E6609 Other obesity due to excess calories: Secondary | ICD-10-CM | POA: Diagnosis not present

## 2020-03-14 DIAGNOSIS — J209 Acute bronchitis, unspecified: Secondary | ICD-10-CM

## 2020-03-14 DIAGNOSIS — R0602 Shortness of breath: Secondary | ICD-10-CM | POA: Diagnosis not present

## 2020-03-24 DIAGNOSIS — E063 Autoimmune thyroiditis: Secondary | ICD-10-CM | POA: Diagnosis not present

## 2020-03-24 DIAGNOSIS — E7849 Other hyperlipidemia: Secondary | ICD-10-CM | POA: Diagnosis not present

## 2020-03-24 DIAGNOSIS — I129 Hypertensive chronic kidney disease with stage 1 through stage 4 chronic kidney disease, or unspecified chronic kidney disease: Secondary | ICD-10-CM | POA: Diagnosis not present

## 2020-03-24 DIAGNOSIS — M1991 Primary osteoarthritis, unspecified site: Secondary | ICD-10-CM | POA: Diagnosis not present

## 2020-04-22 DIAGNOSIS — N183 Chronic kidney disease, stage 3 unspecified: Secondary | ICD-10-CM | POA: Diagnosis not present

## 2020-04-22 DIAGNOSIS — M1991 Primary osteoarthritis, unspecified site: Secondary | ICD-10-CM | POA: Diagnosis not present

## 2020-04-22 DIAGNOSIS — E063 Autoimmune thyroiditis: Secondary | ICD-10-CM | POA: Diagnosis not present

## 2020-04-22 DIAGNOSIS — I129 Hypertensive chronic kidney disease with stage 1 through stage 4 chronic kidney disease, or unspecified chronic kidney disease: Secondary | ICD-10-CM | POA: Diagnosis not present

## 2020-06-21 DIAGNOSIS — I129 Hypertensive chronic kidney disease with stage 1 through stage 4 chronic kidney disease, or unspecified chronic kidney disease: Secondary | ICD-10-CM | POA: Diagnosis not present

## 2020-06-21 DIAGNOSIS — E7849 Other hyperlipidemia: Secondary | ICD-10-CM | POA: Diagnosis not present

## 2020-06-21 DIAGNOSIS — E063 Autoimmune thyroiditis: Secondary | ICD-10-CM | POA: Diagnosis not present

## 2020-06-21 DIAGNOSIS — N183 Chronic kidney disease, stage 3 unspecified: Secondary | ICD-10-CM | POA: Diagnosis not present

## 2020-07-04 DIAGNOSIS — E7849 Other hyperlipidemia: Secondary | ICD-10-CM | POA: Diagnosis not present

## 2020-07-04 DIAGNOSIS — Z6830 Body mass index (BMI) 30.0-30.9, adult: Secondary | ICD-10-CM | POA: Diagnosis not present

## 2020-07-04 DIAGNOSIS — J209 Acute bronchitis, unspecified: Secondary | ICD-10-CM | POA: Diagnosis not present

## 2020-07-04 DIAGNOSIS — E063 Autoimmune thyroiditis: Secondary | ICD-10-CM | POA: Diagnosis not present

## 2020-07-04 DIAGNOSIS — Z00110 Health examination for newborn under 8 days old: Secondary | ICD-10-CM | POA: Diagnosis not present

## 2020-07-04 DIAGNOSIS — Z0001 Encounter for general adult medical examination with abnormal findings: Secondary | ICD-10-CM | POA: Diagnosis not present

## 2020-07-04 DIAGNOSIS — Z1389 Encounter for screening for other disorder: Secondary | ICD-10-CM | POA: Diagnosis not present

## 2020-07-04 DIAGNOSIS — Z0000 Encounter for general adult medical examination without abnormal findings: Secondary | ICD-10-CM | POA: Diagnosis not present

## 2020-07-04 DIAGNOSIS — E6609 Other obesity due to excess calories: Secondary | ICD-10-CM | POA: Diagnosis not present

## 2020-07-15 ENCOUNTER — Emergency Department (HOSPITAL_COMMUNITY): Payer: PPO

## 2020-07-15 ENCOUNTER — Inpatient Hospital Stay (HOSPITAL_COMMUNITY)
Admission: EM | Admit: 2020-07-15 | Discharge: 2020-07-17 | DRG: 304 | Disposition: A | Discharge status: Home / Self Care | Payer: PPO | Attending: Internal Medicine | Admitting: Internal Medicine

## 2020-07-15 ENCOUNTER — Other Ambulatory Visit: Payer: Self-pay

## 2020-07-15 ENCOUNTER — Encounter (HOSPITAL_COMMUNITY): Payer: Self-pay | Admitting: Emergency Medicine

## 2020-07-15 DIAGNOSIS — N400 Enlarged prostate without lower urinary tract symptoms: Secondary | ICD-10-CM | POA: Diagnosis not present

## 2020-07-15 DIAGNOSIS — I611 Nontraumatic intracerebral hemorrhage in hemisphere, cortical: Secondary | ICD-10-CM | POA: Diagnosis present

## 2020-07-15 DIAGNOSIS — Z96653 Presence of artificial knee joint, bilateral: Secondary | ICD-10-CM | POA: Diagnosis present

## 2020-07-15 DIAGNOSIS — R519 Headache, unspecified: Secondary | ICD-10-CM | POA: Diagnosis not present

## 2020-07-15 DIAGNOSIS — Z79890 Hormone replacement therapy (postmenopausal): Secondary | ICD-10-CM

## 2020-07-15 DIAGNOSIS — I619 Nontraumatic intracerebral hemorrhage, unspecified: Secondary | ICD-10-CM | POA: Diagnosis not present

## 2020-07-15 DIAGNOSIS — I10 Essential (primary) hypertension: Secondary | ICD-10-CM | POA: Diagnosis not present

## 2020-07-15 DIAGNOSIS — I680 Cerebral amyloid angiopathy: Secondary | ICD-10-CM | POA: Diagnosis not present

## 2020-07-15 DIAGNOSIS — G4459 Other complicated headache syndrome: Secondary | ICD-10-CM | POA: Diagnosis not present

## 2020-07-15 DIAGNOSIS — I700 Atherosclerosis of aorta: Secondary | ICD-10-CM | POA: Diagnosis not present

## 2020-07-15 DIAGNOSIS — Z8052 Family history of malignant neoplasm of bladder: Secondary | ICD-10-CM | POA: Diagnosis not present

## 2020-07-15 DIAGNOSIS — Z79899 Other long term (current) drug therapy: Secondary | ICD-10-CM | POA: Diagnosis not present

## 2020-07-15 DIAGNOSIS — I161 Hypertensive emergency: Secondary | ICD-10-CM | POA: Diagnosis not present

## 2020-07-15 DIAGNOSIS — Z87891 Personal history of nicotine dependence: Secondary | ICD-10-CM | POA: Diagnosis not present

## 2020-07-15 DIAGNOSIS — F0150 Vascular dementia without behavioral disturbance: Secondary | ICD-10-CM | POA: Diagnosis not present

## 2020-07-15 DIAGNOSIS — E039 Hypothyroidism, unspecified: Secondary | ICD-10-CM

## 2020-07-15 DIAGNOSIS — G4489 Other headache syndrome: Secondary | ICD-10-CM | POA: Diagnosis not present

## 2020-07-15 DIAGNOSIS — Z6830 Body mass index (BMI) 30.0-30.9, adult: Secondary | ICD-10-CM

## 2020-07-15 DIAGNOSIS — I160 Hypertensive urgency: Secondary | ICD-10-CM | POA: Diagnosis present

## 2020-07-15 DIAGNOSIS — E669 Obesity, unspecified: Secondary | ICD-10-CM | POA: Diagnosis present

## 2020-07-15 DIAGNOSIS — I672 Cerebral atherosclerosis: Secondary | ICD-10-CM | POA: Diagnosis present

## 2020-07-15 DIAGNOSIS — E782 Mixed hyperlipidemia: Secondary | ICD-10-CM | POA: Diagnosis present

## 2020-07-15 DIAGNOSIS — E854 Organ-limited amyloidosis: Secondary | ICD-10-CM | POA: Diagnosis present

## 2020-07-15 DIAGNOSIS — I4510 Unspecified right bundle-branch block: Secondary | ICD-10-CM | POA: Diagnosis not present

## 2020-07-15 DIAGNOSIS — Z820 Family history of epilepsy and other diseases of the nervous system: Secondary | ICD-10-CM

## 2020-07-15 DIAGNOSIS — Z20822 Contact with and (suspected) exposure to covid-19: Secondary | ICD-10-CM | POA: Diagnosis not present

## 2020-07-15 DIAGNOSIS — R9089 Other abnormal findings on diagnostic imaging of central nervous system: Secondary | ICD-10-CM

## 2020-07-15 DIAGNOSIS — R0902 Hypoxemia: Secondary | ICD-10-CM | POA: Diagnosis not present

## 2020-07-15 DIAGNOSIS — I6521 Occlusion and stenosis of right carotid artery: Secondary | ICD-10-CM | POA: Diagnosis not present

## 2020-07-15 DIAGNOSIS — E785 Hyperlipidemia, unspecified: Secondary | ICD-10-CM

## 2020-07-15 DIAGNOSIS — E871 Hypo-osmolality and hyponatremia: Secondary | ICD-10-CM | POA: Diagnosis not present

## 2020-07-15 DIAGNOSIS — Z96642 Presence of left artificial hip joint: Secondary | ICD-10-CM | POA: Diagnosis present

## 2020-07-15 DIAGNOSIS — E7849 Other hyperlipidemia: Secondary | ICD-10-CM | POA: Diagnosis not present

## 2020-07-15 LAB — CBC WITH DIFFERENTIAL/PLATELET
Abs Immature Granulocytes: 0.06 10*3/uL (ref 0.00–0.07)
Basophils Absolute: 0.1 10*3/uL (ref 0.0–0.1)
Basophils Relative: 1 %
Eosinophils Absolute: 0.1 10*3/uL (ref 0.0–0.5)
Eosinophils Relative: 1 %
HCT: 43.9 % (ref 39.0–52.0)
Hemoglobin: 14.2 g/dL (ref 13.0–17.0)
Immature Granulocytes: 1 %
Lymphocytes Relative: 11 %
Lymphs Abs: 1.2 10*3/uL (ref 0.7–4.0)
MCH: 31 pg (ref 26.0–34.0)
MCHC: 32.3 g/dL (ref 30.0–36.0)
MCV: 95.9 fL (ref 80.0–100.0)
Monocytes Absolute: 1.2 10*3/uL — ABNORMAL HIGH (ref 0.1–1.0)
Monocytes Relative: 11 %
Neutro Abs: 8.3 10*3/uL — ABNORMAL HIGH (ref 1.7–7.7)
Neutrophils Relative %: 75 %
Platelets: 240 10*3/uL (ref 150–400)
RBC: 4.58 MIL/uL (ref 4.22–5.81)
RDW: 12.6 % (ref 11.5–15.5)
WBC: 10.9 10*3/uL — ABNORMAL HIGH (ref 4.0–10.5)
nRBC: 0 % (ref 0.0–0.2)

## 2020-07-15 LAB — BASIC METABOLIC PANEL
Anion gap: 9 (ref 5–15)
BUN: 18 mg/dL (ref 8–23)
CO2: 29 mmol/L (ref 22–32)
Calcium: 9.1 mg/dL (ref 8.9–10.3)
Chloride: 95 mmol/L — ABNORMAL LOW (ref 98–111)
Creatinine, Ser: 1.12 mg/dL (ref 0.61–1.24)
GFR, Estimated: >60 mL/min (ref >60)
Glucose, Bld: 127 mg/dL — ABNORMAL HIGH (ref 70–99)
Potassium: 3.6 mmol/L (ref 3.5–5.1)
Sodium: 133 mmol/L — ABNORMAL LOW (ref 135–145)

## 2020-07-15 MED ORDER — ONDANSETRON HCL 4 MG/2ML IJ SOLN
4.0000 mg | Freq: Four times a day (QID) | INTRAMUSCULAR | Status: DC | PRN
  Administered 2020-07-16: 4 mg via INTRAVENOUS
  Filled 2020-07-15: qty 2

## 2020-07-15 MED ORDER — IOHEXOL 350 MG/ML SOLN
75.0000 mL | Freq: Once | INTRAVENOUS | Status: AC | PRN
  Administered 2020-07-15: 75 mL via INTRAVENOUS

## 2020-07-15 MED ORDER — HYDRALAZINE HCL 20 MG/ML IJ SOLN
10.0000 mg | Freq: Once | INTRAMUSCULAR | Status: AC
  Administered 2020-07-15: 10 mg via INTRAVENOUS
  Filled 2020-07-15: qty 1

## 2020-07-15 MED ORDER — OXYCODONE HCL 5 MG PO TABS: 5.0000 mg | ORAL_TABLET | Freq: Four times a day (QID) | ORAL | Status: DC | PRN

## 2020-07-15 MED ORDER — TAMSULOSIN HCL 0.4 MG PO CAPS
0.4000 mg | ORAL_CAPSULE | Freq: Every day | ORAL | Status: DC
  Administered 2020-07-16 – 2020-07-17 (×2): 0.4 mg via ORAL
  Filled 2020-07-15 (×2): qty 1

## 2020-07-15 MED ORDER — HYDRALAZINE HCL 20 MG/ML IJ SOLN: 10.0000 mg | Freq: Four times a day (QID) | INTRAMUSCULAR | Status: DC | PRN

## 2020-07-15 MED ORDER — ATORVASTATIN CALCIUM 10 MG PO TABS: 20.0000 mg | ORAL_TABLET | Freq: Every day | ORAL | Status: DC

## 2020-07-15 MED ORDER — GADOBUTROL 1 MMOL/ML IV SOLN
9.0000 mL | Freq: Once | INTRAVENOUS | Status: AC | PRN
  Administered 2020-07-15: 9 mL via INTRAVENOUS

## 2020-07-15 MED ORDER — LABETALOL HCL 5 MG/ML IV SOLN
10.0000 mg | Freq: Once | INTRAVENOUS | Status: AC
  Administered 2020-07-15: 10 mg via INTRAVENOUS
  Filled 2020-07-15: qty 4

## 2020-07-15 MED ORDER — SENNOSIDES-DOCUSATE SODIUM 8.6-50 MG PO TABS: 1.0000 | ORAL_TABLET | Freq: Every evening | ORAL | Status: DC | PRN

## 2020-07-15 MED ORDER — ONDANSETRON HCL 4 MG PO TABS: 4.0000 mg | ORAL_TABLET | Freq: Four times a day (QID) | ORAL | Status: DC | PRN

## 2020-07-15 MED ORDER — SODIUM CHLORIDE 0.9 % IV SOLN: INTRAVENOUS | Status: DC

## 2020-07-15 MED ORDER — HYDROCHLOROTHIAZIDE 12.5 MG PO CAPS
12.5000 mg | ORAL_CAPSULE | ORAL | Status: DC
  Administered 2020-07-16 – 2020-07-17 (×2): 12.5 mg via ORAL
  Filled 2020-07-15 (×2): qty 1

## 2020-07-15 MED ORDER — LEVOTHYROXINE SODIUM 25 MCG PO TABS
25.0000 ug | ORAL_TABLET | Freq: Every day | ORAL | Status: DC
  Administered 2020-07-16 – 2020-07-17 (×2): 25 ug via ORAL
  Filled 2020-07-15 (×2): qty 1
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
## 2020-07-15 NOTE — ED Triage Notes (Signed)
Pt from home, noticed his BP has been elevated with a headache since this morning. It took his BP meds this morning with no relief.
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
## 2020-07-15 NOTE — ED Provider Notes (Signed)
I received this patient in transfer from [facility].  He had presented with hypertension and headache, found to have possible microhemorrhage on CT/CTA and neurology had recommended transfer for brain MRI.  Had received hydralazine and labetalol prior to arrival with improvement in blood pressures.  Alert, conversant, no distress on exam.  MRI shows punctate focus of hemorrhage in right occipital lobe as well as numerous chronic microhemorrhages suggestive of cerebral amyloid angiopathy.  Discussed findings with neurology, Dr. Otelia Limes, who recommended medicine admission and their team will see pt in consultation. Discussed admission w/ [facility], Dr. Rachael Darby.   Jazmin Vensel, Ambrose Finland, MD 07/15/20 [phone]

## 2020-07-15 NOTE — ED Notes (Signed)
Gave pt a turley sandwich and water; tolerating well. Passed stroke swallow screen.

## 2020-07-15 NOTE — ED Provider Notes (Signed)
I provided a substantive portion of the care of this patient.  I personally performed the entirety of the history exam and decision making for this encounter.   Patient seen by me along with physician assistant.  Patient with acute onset of headache behind the left eye.  Patient has a history of hypertension was just on hydrochlorothiazide.  No headache to the back of the head neuro exam completely normal.  There was no visual changes associated with this.  No neurofocal deficits  CT raise some concerns for hemorrhage in the occipital area.  We had Dr. Cameron Ali on for teleneurology review the head CT.  He was not overly impressed.  But he recommended getting CT angio head and neck.  And then to follow back up with him to review those scans.  In addition patient's blood pressure was markedly high here.  Patient was given hydralazine IV as well as hydrochlorothiazide.  Most recently blood pressure systolic was down to 149 so was showing signs of improvement.  Patient labs showed no evidence of any endorgan dysfunction.  His renal function is normal.  Patient without any chest pain or shortness of breath.        Vanetta Mulders, MD 07/15/20 [phone]

## 2020-07-15 NOTE — Progress Notes (Signed)
PHONE CALL NOTE  Called by the [facility] emergency room APP taking care of this patient regarding imaging findings.  Patient presented with sudden onset of headache-pain behind the left eye this morning with increased blood pressure systolics in the 200s. Head CT with a small hyperdensity-3 mm-in the right occipital lobe with differentials including ICH and other differentials including a vascular lesion versus calcification.  I recommended a CT angio head and neck be performed to look for any underlying vascular malformation which was unremarkable. I discussed this case personally with neuroradiology on-call- who agreed with me that this is rather atypical for an ICH, especially a primary ICH due to hypertension and is very small to accurately define on the CT.  Given that he came in extremely hypertensive, I did recommend that the patient be given IV labetalol and IV hydralazine to keep the pressures below 160 and not so 140 until we are 100% sure that this is an ICH. He is clinically stable and improving per ER report. If above meds fail to control BP, start on Cleviprex drip while transport and while in ER for goal SBP<160 should MRI confirm this to be a bleed.  The way we are going to get to a definitive diagnosis is by getting an MRI with and without contrast-for which he will require transfer to [facility]. [facility], APP will initiate an ED to ED transfer for imaging.  Discussed with [facility] APP Tammy T and Dr. Particia Nearing over phone.  Please call the on-call neurologist at the time at [facility] once MRI brain is completed-schedule on AMION  -- Milon Dikes, MD On call Neurologist for [facility] Pager: [phone]
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
## 2020-07-16 LAB — CBC
HCT: 40.3 % (ref 39.0–52.0)
Hemoglobin: 13.5 g/dL (ref 13.0–17.0)
MCH: 31.1 pg (ref 26.0–34.0)
MCHC: 33.5 g/dL (ref 30.0–36.0)
MCV: 92.9 fL (ref 80.0–100.0)
Platelets: 241 10*3/uL (ref 150–400)
RBC: 4.34 MIL/uL (ref 4.22–5.81)
RDW: 12.6 % (ref 11.5–15.5)
WBC: 11.9 10*3/uL — ABNORMAL HIGH (ref 4.0–10.5)
nRBC: 0 % (ref 0.0–0.2)

## 2020-07-16 LAB — BASIC METABOLIC PANEL
Anion gap: 11 (ref 5–15)
BUN: 13 mg/dL (ref 8–23)
CO2: 25 mmol/L (ref 22–32)
Calcium: 9 mg/dL (ref 8.9–10.3)
Chloride: 97 mmol/L — ABNORMAL LOW (ref 98–111)
Creatinine, Ser: 1.05 mg/dL (ref 0.61–1.24)
GFR, Estimated: >60 mL/min (ref >60)
Glucose, Bld: 107 mg/dL — ABNORMAL HIGH (ref 70–99)
Potassium: 3.5 mmol/L (ref 3.5–5.1)
Sodium: 133 mmol/L — ABNORMAL LOW (ref 135–145)

## 2020-07-16 LAB — SARS CORONAVIRUS 2 (TAT 6-24 HRS): SARS Coronavirus 2: NEGATIVE

## 2020-07-16 NOTE — Progress Notes (Signed)
PROGRESS NOTE  [name]  DOB: 1945-04-24  PCP: Elfredia Nevins, MD WNU:[id number]  DOA: 07/15/2020  LOS: 1 day   Chief Complaint  Patient presents with  . Hypertension   Brief narrative: Ryan [name] is a 75 y.o. male with PMH significant for HTN, HLD, hypothyroidism, BPH. Patient presented to ED at [facility] on 4/23 with sudden sharp pain behind his left eye and elevated blood pressure of 200/100. CT scan of head in the ED showed findings concerning for microhemorrhages in the occipital region.  Lab work unremarkable.   Neurology was consulted.  Blood pressure was lowered with IV labetalol and IV hydralazine.   Patient was transferred to [facility] for MRI of brain MRI brain showed a punctated region of hemorrhage in the right occipital region.  Admitted to hospitalist service  Subjective: Patient was seen and examined this morning. Pleasant elderly Caucasian male.  Lying down in bed.  Not in distress.  Wife at bedside. Blood pressure is better  Assessment/Plan: Hypertensive emergency -Initial blood pressure was elevated to 200/100. Lowered with IV labetalol and IV hydralazine. -Patient and family states that at home, his blood pressure is usually in the 130s at home and never elevated up to 200. -On HCTZ at home. Resumed. -Continue to monitor blood pressure for next 24 hours.  Cerebral hemorrhage -Imagings showed an acute microhemorrhage on the right occipital lobe -Per neurology, finding consistent with cerebral amyloid angiopathy. -Clinically, patient does not have any visual field deficit, he has chronic bilateral lower extremity weakness -Stroke team to eval the patient today. -PT OT -Per neurologist Dr. Otelia Limes, studies have shown an increased risk of intracerebral hemorrhage in patients with amyloid angiopathy who are taking statins.  Hence Lipitor was stopped.  Hyperlipidemia -Lipitor stopped, as stated above  Mild hyponatremia -Sodium level  slightly low at 133. -Continue to monitor.  Hypothyroidism -Continue levothyroxine  Mobility: PT/OT eval pending Code Status:   Code Status: Full Code  Nutritional status: Body mass index is 30.74 kg/m.     Diet Order            Diet Heart Room service appropriate? Yes; Fluid consistency: Thin  Diet effective now                 DVT prophylaxis: SCDs Start: 07/15/20 2358   Antimicrobials:  None Fluid: Stop IV fluid Consultants: Neurologist Family Communication:  Wife at bedside  Status is: Inpatient  Remains inpatient appropriate because: Needs further blood pressure monitoring for next 24 hours  Dispo: The patient is from: Home              Anticipated d/c is to: Home              Patient currently is not medically stable to d/c.   Difficult to place patient No     Infusions:    Scheduled Meds: . hydrochlorothiazide  12.5 mg Oral BH-q7a  . levothyroxine  25 mcg Oral QAC breakfast  . tamsulosin  0.4 mg Oral Daily    Antimicrobials: Anti-infectives (From admission, onward)   None      PRN meds: hydrALAZINE, ondansetron **OR** ondansetron (ZOFRAN) IV, oxyCODONE, senna-docusate   Objective: Vitals:   07/16/20 0400 07/16/20 0721  BP: (!) 142/71 (!) 157/91  Pulse: 74 67  Resp: 17 16  Temp: 98.1 F (36.7 C) 98.1 F (36.7 C)  SpO2: 94% 94%    Intake/Output Summary (Last 24 hours) at 07/16/2020 1109 Last data filed at  07/16/2020 0736 Gross per 24 hour  Intake 168.98 ml  Output 150 ml  Net 18.98 ml   Filed Weights   07/15/20 1314 07/16/20 0228  Weight: 89.8 kg 91.7 kg   Weight change:  Body mass index is 30.74 kg/m.   Physical Exam: General exam: Pleasant, elderly Caucasian male.  Lying down in bed.  Not in distress Skin: No rashes, lesions or ulcers. HEENT: Atraumatic, normocephalic, no obvious bleeding Lungs: Clear to auscultation bilaterally CVS: Regular rate and rhythm, no murmur GI/Abd soft, nontender, nondistended, bowel sound  present CNS: Alert, awake, oriented x3 Psychiatry: Mood appropriate Extremities: No pedal edema, no calf tenderness  Data Review: I have personally reviewed the laboratory data and studies available.  Recent Labs  Lab 07/15/20 1354 07/16/20 0404  WBC 10.9* 11.9*  NEUTROABS 8.3*  --   HGB 14.2 13.5  HCT 43.9 40.3  MCV 95.9 92.9  PLT 240 241   Recent Labs  Lab 07/15/20 1354 07/16/20 0404  NA 133* 133*  K 3.6 3.5  CL 95* 97*  CO2 29 25  GLUCOSE 127* 107*  BUN 18 13  CREATININE 1.12 1.05  CALCIUM 9.1 9.0    F/u labs ordered Unresulted Labs (From admission, onward)          Start     Ordered   Unscheduled  Basic metabolic panel  Tomorrow morning,   R       Question:  Specimen collection method  Answer:  Lab=Lab collect   07/16/20 1109   Unscheduled  CBC with Differential/Platelet  Tomorrow morning,   R       Question:  Specimen collection method  Answer:  Lab=Lab collect   07/16/20 1109          Signed, Lorin Glass, MD [facility] 07/16/2020

## 2020-07-16 NOTE — Plan of Care (Signed)

## 2020-07-16 NOTE — Progress Notes (Signed)
Pt received from ED in stable condition. Pt A/Ox4. NIH=0. Pt denies pain. Pt oriented to room and skin assessment completed.
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
## 2020-07-17 DIAGNOSIS — E7849 Other hyperlipidemia: Secondary | ICD-10-CM | POA: Diagnosis not present

## 2020-07-17 LAB — HEMOGLOBIN A1C
Hgb A1c MFr Bld: 6.2 % — ABNORMAL HIGH (ref 4.8–5.6)
Mean Plasma Glucose: 131.24 mg/dL

## 2020-07-17 LAB — CBC WITH DIFFERENTIAL/PLATELET
Abs Immature Granulocytes: 0.06 10*3/uL (ref 0.00–0.07)
Basophils Absolute: 0.1 10*3/uL (ref 0.0–0.1)
Basophils Relative: 1 %
Eosinophils Absolute: 0 10*3/uL (ref 0.0–0.5)
Eosinophils Relative: 0 %
HCT: 44 % (ref 39.0–52.0)
Hemoglobin: 14.4 g/dL (ref 13.0–17.0)
Immature Granulocytes: 1 %
Lymphocytes Relative: 8 %
Lymphs Abs: 0.8 10*3/uL (ref 0.7–4.0)
MCH: 30.6 pg (ref 26.0–34.0)
MCHC: 32.7 g/dL (ref 30.0–36.0)
MCV: 93.6 fL (ref 80.0–100.0)
Monocytes Absolute: 0.7 10*3/uL (ref 0.1–1.0)
Monocytes Relative: 8 %
Neutro Abs: 7.9 10*3/uL — ABNORMAL HIGH (ref 1.7–7.7)
Neutrophils Relative %: 82 %
Platelets: 255 10*3/uL (ref 150–400)
RBC: 4.7 MIL/uL (ref 4.22–5.81)
RDW: 12.6 % (ref 11.5–15.5)
WBC: 9.5 10*3/uL (ref 4.0–10.5)
nRBC: 0 % (ref 0.0–0.2)

## 2020-07-17 LAB — BASIC METABOLIC PANEL
Anion gap: 7 (ref 5–15)
BUN: 11 mg/dL (ref 8–23)
CO2: 29 mmol/L (ref 22–32)
Calcium: 9.2 mg/dL (ref 8.9–10.3)
Chloride: 97 mmol/L — ABNORMAL LOW (ref 98–111)
Creatinine, Ser: 1.1 mg/dL (ref 0.61–1.24)
GFR, Estimated: >60 mL/min (ref >60)
Glucose, Bld: 148 mg/dL — ABNORMAL HIGH (ref 70–99)
Potassium: 3.5 mmol/L (ref 3.5–5.1)
Sodium: 133 mmol/L — ABNORMAL LOW (ref 135–145)

## 2020-07-17 LAB — LIPID PANEL
Cholesterol: 155 mg/dL (ref 0–200)
HDL: 45 mg/dL (ref >40)
LDL Cholesterol: 98 mg/dL (ref 0–99)
Total CHOL/HDL Ratio: 3.4 RATIO
Triglycerides: 62 mg/dL (ref <150)
VLDL: 12 mg/dL (ref 0–40)

## 2020-07-17 MED ORDER — MELATONIN 3 MG PO TABS: 3.0000 mg | ORAL_TABLET | Freq: Every day | ORAL | Status: DC

## 2020-07-17 MED ORDER — MELATONIN 3 MG PO TABS: 3.0000 mg | ORAL_TABLET | Freq: Every evening | ORAL | 0 refills | Status: AC | PRN

## 2020-07-17 NOTE — Progress Notes (Signed)
Stroke Team Progress Note  SUBJECTIVE Patient is sitting up comfortably in bed.  His wife and son are at bedside.  He states his headache is resolved.  He is feeling a lot better.  He wants to go home.  Denies any focal neurological symptoms well.  Complains of severe headache and dizziness in setting of significantly elevated blood pressure which is doing much better now.  MRI scan showed punctate focus of hemorrhage in the right occipital lobe with no mass-effect and numerous chronic microhemorrhages predominantly in the posterior peripheral brain with pattern consistent with cerebral amyloid angiopathy.   OBJECTIVE Most recent Vital Signs: Temp: 97.9 F (36.6 C) (04/25 1134) Temp Source: Oral (04/25 1134) BP: 150/100 (04/25 1134) Pulse Rate: 91 (04/25 1134) Respiratory Rate: 14 O2 Saturdation: 95%  CBG (last 3)  No results for input(s): GLUCAP in the last 72 hours.     Studies:  CT single 3 mm hyperdense focus at the gray-white junction in the right occipital lobe concerning for small parenchymal hemorrhage.  Changes of chronic small vessel disease. MRI punctate focus of hemorrhage right occipital lobe.  No associated mass-effect.  Numerous chronic microhemorrhages consistent with cerebral amyloid angiopathy. CTA head and neck no large vessel stenosis or occlusion.  Mild intracranial atherosclerosis involving right ICA. ECHO not ordered LDL 98 mg percent HbA1c 6.2.  Physical Exam:   Obese elderly Caucasian male not in distress. . Afebrile. Head is nontraumatic. Neck is supple without bruit.    Cardiac exam no murmur or gallop. Lungs are clear to auscultation. Distal pulses are well felt. Neurological Exam ;  Awake  Alert oriented x 3. Normal speech and language.eye movements full without nystagmus.fundi were not visualized. Vision acuity and fields appear normal. Hearing is normal. Palatal movements are normal. Face symmetric. Tongue midline. Normal strength, tone, reflexes and  coordination. Normal sensation. Gait deferred.  ASSESSMENT [name] is a 75 y.o. male with transient episode of dizziness, headache and nausea in the setting of hypertensive emergency.  MRI scan shows incidental tiny right occipital is a several other areas of microhemorrhages likely related to hypertensive small vessel disease versus cerebral amyloid angiopathy.  Hospital day # 2  TREATMENT/PLAN  I have personally examined this patient, reviewed notes, independently viewed imaging studies, participated in medical decision making and plan of care.ROS completed by me personally and pertinent positives fully documented  I have made any additions or clarifications directly to the above note. .  Patient counseled to be compliant with his blood pressure medication sent to #aggressive blood pressure control with goal below 130/90.  He was advised to follow-up with primary care physician within a week for the same.  Follow-up as an outpatient with stroke clinic in 6 weeks.  Long discussion with patient and wife and son and answered questions.  I spent 25  minutes in total face-to-face time with the patient, more than 50% of which was spent in counseling and coordination of care, reviewing test results, reviewing medication and discussing or reviewing the diagnosis of    , the prognosis and treatment options.   Delia Heady, MD Medical Director [facility] Pager: [phone] 07/17/2020 2:36 PM

## 2020-07-17 NOTE — Progress Notes (Signed)
IV removed, discharge instructions reviewed with patient and family.  Pt transported via wheelchair home with family.
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
## 2020-07-17 NOTE — Discharge Summary (Signed)
Physician Discharge Summary  Ryan [name]:[id number] DOB: [birth year] DOA: 07/15/2020  PCP: Elfredia Nevins, MD  Admit date: 07/15/2020 Discharge date: 07/17/2020  Admitted From: Home Discharge disposition: Home   Code Status: Full Code  Diet Recommendation: Cardiac diet  Discharge Diagnosis:   Principal Problem:   Hypertensive urgency Active Problems:   Cerebral brain hemorrhage [facility])   Hyperlipidemia   Hypothyroidism   Chief Complaint  Patient presents with  . Hypertension   Brief narrative: Ryan [name] is a 75 y.o. male with PMH significant for HTN, HLD, hypothyroidism, BPH. Patient presented to ED at [facility] on 4/23 with sudden sharp pain behind his left eye and elevated blood pressure of 200/100. CT scan of head in the ED showed findings concerning for microhemorrhages in the occipital region.  Lab work unremarkable.   Neurology was consulted.  Blood pressure was lowered with IV labetalol and IV hydralazine.   Patient was transferred to [facility] for MRI of brain MRI brain showed a punctated region of hemorrhage in the right occipital region.  Admitted to hospitalist service  Subjective: Patient was seen and examined this morning. Lying on bed.  Not in distress.  Wife at bedside. Events of last night noted.  He was confused, as needed and restless.  Hospital course: Hypertensive emergency -Initial blood pressure was elevated to 200/100. Lowered with IV labetalol and IV hydralazine. -Patient and family states that at home, his blood pressure is usually in the 130s at home and never elevated up to 200. -On HCTZ at home. Resumed.  Patient's blood pressure over the last 24 hours has been mostly in 130s with few episodes of 150s.  We will continue the same dose of HCTZ at home.  Cerebral hemorrhage -Imagings showed an acute microhemorrhage on the right occipital lobe -Per neurology, finding consistent with cerebral amyloid angiopathy. -Clinically,  patient does not have any visual field deficit, he has chronic bilateral lower extremity weakness -Stroke team to eval the patient today. -Continue Lipitor per neurology recommendation for 4/24.  Acute delirium -Patient probably has early stage vascular dementia which was subclinical prior to admission.  For 2 nights in the hospital, patient has been having nightly episodes of hallucinations.  Normal mental status during the day and this morning. -Over-the-counter melatonin at bedtime as needed recommended  Hyperlipidemia -Lipitor  Mild hyponatremia -Sodium level slightly low at 133. -Continue to monitor.  Hypothyroidism -Continue levothyroxine   Wound care: Incision (Closed) 09/11/16 Hip (Active)  Date First Assessed/Time First Assessed: 09/11/16 1005   Location: Hip    Assessments 09/11/2016 10:45 AM 09/12/2016  7:39 AM  Dressing Type Adhesive bandage Adhesive bandage  Dressing Clean;Dry;Intact Clean;Dry;Intact  Drainage Amount None --     No Linked orders to display    Discharge Exam:   Vitals:   07/16/20 2300 07/17/20 0311 07/17/20 0700 07/17/20 1134  BP: 130/60 (!) 141/87 (!) 159/98 (!) 150/100  Pulse: 64 75  91  Resp: 13 14    Temp: 98.5 F (36.9 C) 97.6 F (36.4 C) 97.9 F (36.6 C) 97.9 F (36.6 C)  TempSrc: Oral Oral Oral Oral  SpO2: 94% 93% 94% 95%  Weight:      Height:        Body mass index is 30.74 kg/m.  General exam: Pleasant, elderly Caucasian male.  Not in distress Skin: No rashes, lesions or ulcers. HEENT: Atraumatic, normocephalic, no obvious bleeding Lungs: Clear to auscultation bilaterally CVS: Regular rate and rhythm, no murmur  GI/Abd soft, nontender, nondistended, bowel sound present CNS: Alert, awake, oriented x3 Psychiatry: Mood appropriate Extremities: No pedal edema, no calf tenderness  Follow ups:   Discharge Instructions    Diet - low sodium heart healthy   Complete by: As directed    Increase activity slowly   Complete by:  As directed       Follow-up Information    Elfredia Nevins, MD Follow up.   Specialty: Internal Medicine Contact information: [address] [place] [address] [phone]        Jonelle Sidle, MD .   Specialty: Cardiology Contact information: [address] [place] [address] [phone]               Recommendations for Outpatient Follow-Up:   1. Follow-up with PCP as an outpatient 2. Follow-up with neurology as an outpatient  Discharge Instructions:  Follow with Primary MD Elfredia Nevins, MD in 7 days   Get CBC/BMP checked in next visit within 1 week by PCP or SNF MD ( we routinely change or add medications that can affect your baseline labs and fluid status, therefore we recommend that you get the mentioned basic workup next visit with your PCP, your PCP may decide not to get them or add new tests based on their clinical decision)  On your next visit with your PCP, please Get Medicines reviewed and adjusted.  Please request your PCP  to go over all Hospital Tests and Procedure/Radiological results at the follow up, please get all Hospital records sent to your Prim MD by signing hospital release before you go home.  Activity: As tolerated with Full fall precautions use walker/cane & assistance as needed  For Heart failure patients - Check your Weight same time everyday, if you gain over 2 pounds, or you develop in leg swelling, experience more shortness of breath or chest pain, call your Primary MD immediately. Follow Cardiac Low Salt Diet and 1.5 lit/day fluid restriction.  If you have smoked or chewed Tobacco in the last 2 yrs please stop smoking, stop any regular Alcohol  and or any Recreational drug use.  If you experience worsening of your admission symptoms, develop shortness of breath, life threatening emergency, suicidal or homicidal thoughts you must seek medical attention immediately by calling 911 or calling your MD immediately  if  symptoms less severe.  You Must read complete instructions/literature along with all the possible adverse reactions/side effects for all the Medicines you take and that have been prescribed to you. Take any new Medicines after you have completely understood and accpet all the possible adverse reactions/side effects.   Do not drive, operate heavy machinery, perform activities at heights, swimming or participation in water activities or provide baby sitting services if your were admitted for syncope or siezures until you have seen by Primary MD or a Neurologist and advised to do so again.  Do not drive when taking Pain medications.  Do not take more than prescribed Pain, Sleep and Anxiety Medications  Wear Seat belts while driving.   Please note You were cared for by a hospitalist during your hospital stay. If you have any questions about your discharge medications or the care you received while you were in the hospital after you are discharged, you can call the unit and asked to speak with the hospitalist on call if the hospitalist that took care of you is not available. Once you are discharged, your primary care physician will handle any further medical issues. Please  note that NO REFILLS for any discharge medications will be authorized once you are discharged, as it is imperative that you return to your primary care physician (or establish a relationship with a primary care physician if you do not have one) for your aftercare needs so that they can reassess your need for medications and monitor your lab values.    Allergies as of 07/17/2020      Reactions   Aspirin Rash   Codeine Other (See Comments)   Knocks me out   Morphine Other (See Comments)   Passed out      Medication List    STOP taking these medications   ibuprofen 200 MG tablet Commonly known as: ADVIL     TAKE these medications   atorvastatin 20 MG tablet Commonly known as: LIPITOR Take 20 mg by mouth daily at 6 PM.    hydrochlorothiazide 12.5 MG capsule Commonly known as: MICROZIDE Take 12.5 mg by mouth every morning.   levothyroxine 25 MCG tablet Commonly known as: SYNTHROID Take 25 mcg by mouth daily before breakfast.   melatonin 3 MG Tabs tablet Take 1 tablet (3 mg total) by mouth at bedtime as needed (insomnia).   tamsulosin 0.4 MG Caps capsule Commonly known as: FLOMAX Take 0.4 mg by mouth daily.       Time coordinating discharge: 35 minutes  The results of significant diagnostics from this hospitalization (including imaging, microbiology, ancillary and laboratory) are listed below for reference.    Procedures and Diagnostic Studies:   CT Angio Head W or Wo Contrast  Result Date: 07/15/2020 CLINICAL DATA:  Hypertension. Headache. Possible subcentimeter right occipital hemorrhage on earlier noncontrast head CT. EXAM: CT ANGIOGRAPHY HEAD AND NECK TECHNIQUE: Multidetector CT imaging of the head and neck was performed using the standard protocol during bolus administration of intravenous contrast. Multiplanar CT image reconstructions and MIPs were obtained to evaluate the vascular anatomy. Carotid stenosis measurements (when applicable) are obtained utilizing NASCET criteria, using the distal internal carotid diameter as the denominator. CONTRAST:  42mL OMNIPAQUE IOHEXOL 350 MG/ML SOLN COMPARISON:  None. FINDINGS: CTA NECK FINDINGS Aortic arch: Normal variant 4 vessel aortic arch with the left vertebral artery arising directly from the arch. Mild aortic atherosclerosis without arch vessel origin stenosis. Right carotid system: Patent with a small to moderate amount of predominantly soft plaque at the carotid bifurcation. No evidence of significant stenosis or dissection. Left carotid system: Patent with a small amount of soft and calcified plaque at the carotid bifurcation resulting in mild stenosis of the ECA origin but no significant common or internal carotid artery stenosis. Vertebral arteries:  The vertebral arteries are patent with the left being mildly dominant. No significant stenosis or dissection is identified although assessment of the left V1 segment is limited by motion. Skeleton: Advanced disc degeneration in the lower cervical spine. Advanced facet arthrosis throughout the cervical spine with multilevel degenerative anterolisthesis. Other neck: No evidence of cervical lymphadenopathy or mass. Upper chest: No apical lung consolidation or mass. Review of the MIP images confirms the above findings CTA HEAD FINDINGS Anterior circulation: The internal carotid arteries are patent from skull base to carotid termini with atherosclerotic plaque resulting in mild cavernous and mild to moderate paraclinoid stenosis on the right and minimal to mild stenosis in the segments on the left. ACAs and MCAs are patent without evidence of a proximal branch occlusion or significant proximal stenosis. No aneurysm is identified. Posterior circulation: The intracranial vertebral arteries are widely patent to the basilar.  The right PICA and left AICA appear dominant. Patent SCAs are seen bilaterally. The basilar artery is widely patent. Posterior communicating arteries are diminutive or absent. Both PCAs are patent without evidence of a significant proximal stenosis. No aneurysm is identified. Venous sinuses: Patent. Anatomic variants: None. Delayed phase: The 3 mm hyperdense focus located near the gray-white junction in the right occipital lobe is unchanged from the earlier noncontrast CT. No abnormal intracranial enhancement is identified. Review of the MIP images confirms the above findings IMPRESSION: 1. No large vessel occlusion. 2. Intracranial atherosclerosis resulting in mild-to-moderate right ICA stenoses. 3. Cervical carotid atherosclerosis without significant stenosis. 4. No significant posterior circulation stenosis. 5. Aortic Atherosclerosis (ICD10-I70.0). Electronically Signed   By: Sebastian Ache M.D.   On:  07/15/2020 16:30   CT Head Wo Contrast  Result Date: 07/15/2020 CLINICAL DATA:  Hypertension.  Sudden onset headache EXAM: CT HEAD WITHOUT CONTRAST TECHNIQUE: Contiguous axial images were obtained from the base of the skull through the vertex without intravenous contrast. COMPARISON:  None. FINDINGS: Brain: Single 3 mm hyperattenuating focus at the gray-white matter junction within the right occipital lobe (series 2, image 20). No appreciable underlying mass or adjacent edema. No additional sites of intra or extra-axial hemorrhage identified. No mass effect. No hydrocephalus. Cavum septum pellucidum. Scattered low-density changes within the periventricular and subcortical white matter compatible with chronic microvascular ischemic change. Mild diffuse cerebral volume loss. Vascular: Atherosclerotic calcifications involving the large vessels of the skull base. No unexpected hyperdense vessel. Skull: Normal. Negative for fracture or focal lesion. Sinuses/Orbits: No acute finding. Other: None. IMPRESSION: 1. Single 3 mm hyperattenuating focus at the gray-white matter junction within the right occipital lobe concerning for a small intraparenchymal hemorrhage. Location is not typical for hypertensive hemorrhage. Underlying lesion is not excluded. Further evaluation with MRI of the brain is recommended. 2. Chronic microvascular ischemic change and cerebral volume loss. These results were called by telephone at the time of interpretation on 07/15/2020 at 2:21 pm to provider [facility]TT , who verbally acknowledged these results. Electronically Signed   By: Duanne Guess D.O.   On: 07/15/2020 14:23   CT Angio Neck W and/or Wo Contrast  Result Date: 07/15/2020 CLINICAL DATA:  Hypertension. Headache. Possible subcentimeter right occipital hemorrhage on earlier noncontrast head CT. EXAM: CT ANGIOGRAPHY HEAD AND NECK TECHNIQUE: Multidetector CT imaging of the head and neck was performed using the standard protocol  during bolus administration of intravenous contrast. Multiplanar CT image reconstructions and MIPs were obtained to evaluate the vascular anatomy. Carotid stenosis measurements (when applicable) are obtained utilizing NASCET criteria, using the distal internal carotid diameter as the denominator. CONTRAST:  75mL OMNIPAQUE IOHEXOL 350 MG/ML SOLN COMPARISON:  None. FINDINGS: CTA NECK FINDINGS Aortic arch: Normal variant 4 vessel aortic arch with the left vertebral artery arising directly from the arch. Mild aortic atherosclerosis without arch vessel origin stenosis. Right carotid system: Patent with a small to moderate amount of predominantly soft plaque at the carotid bifurcation. No evidence of significant stenosis or dissection. Left carotid system: Patent with a small amount of soft and calcified plaque at the carotid bifurcation resulting in mild stenosis of the ECA origin but no significant common or internal carotid artery stenosis. Vertebral arteries: The vertebral arteries are patent with the left being mildly dominant. No significant stenosis or dissection is identified although assessment of the left V1 segment is limited by motion. Skeleton: Advanced disc degeneration in the lower cervical spine. Advanced facet arthrosis throughout the cervical spine  with multilevel degenerative anterolisthesis. Other neck: No evidence of cervical lymphadenopathy or mass. Upper chest: No apical lung consolidation or mass. Review of the MIP images confirms the above findings CTA HEAD FINDINGS Anterior circulation: The internal carotid arteries are patent from skull base to carotid termini with atherosclerotic plaque resulting in mild cavernous and mild to moderate paraclinoid stenosis on the right and minimal to mild stenosis in the segments on the left. ACAs and MCAs are patent without evidence of a proximal branch occlusion or significant proximal stenosis. No aneurysm is identified. Posterior circulation: The  intracranial vertebral arteries are widely patent to the basilar. The right PICA and left AICA appear dominant. Patent SCAs are seen bilaterally. The basilar artery is widely patent. Posterior communicating arteries are diminutive or absent. Both PCAs are patent without evidence of a significant proximal stenosis. No aneurysm is identified. Venous sinuses: Patent. Anatomic variants: None. Delayed phase: The 3 mm hyperdense focus located near the gray-white junction in the right occipital lobe is unchanged from the earlier noncontrast CT. No abnormal intracranial enhancement is identified. Review of the MIP images confirms the above findings IMPRESSION: 1. No large vessel occlusion. 2. Intracranial atherosclerosis resulting in mild-to-moderate right ICA stenoses. 3. Cervical carotid atherosclerosis without significant stenosis. 4. No significant posterior circulation stenosis. 5. Aortic Atherosclerosis (ICD10-I70.0). Electronically Signed   By: Sebastian AcheAllen  Grady M.D.   On: 07/15/2020 16:30   MR Brain W and Wo Contrast  Result Date: 07/15/2020 CLINICAL DATA:  Headache EXAM: MRI HEAD WITHOUT AND WITH CONTRAST TECHNIQUE: Multiplanar, multiecho pulse sequences of the brain and surrounding structures were obtained without and with intravenous contrast. CONTRAST:  9mL GADAVIST GADOBUTROL 1 MMOL/ML IV SOLN COMPARISON:  None. FINDINGS: Brain: Punctate focus of hemorrhage in the right occipital lobe. Numerous chronic microhemorrhages in a predominantly peripheral distribution. Most of the hemorrhages are in the cerebellum. There is multifocal hyperintense T2-weighted signal within the white matter. Parenchymal volume and CSF spaces are normal. The midline structures are normal. Vascular: Right cerebellar developmental venous anomaly Skull and upper cervical spine: Normal calvarium and skull base. Visualized upper cervical spine and soft tissues are normal. Sinuses/Orbits:No paranasal sinus fluid levels or advanced mucosal  thickening. No mastoid or middle ear effusion. Normal orbits. IMPRESSION: 1. Punctate focus of hemorrhage in the right occipital lobe. No associated mass effect. 2. Numerous chronic microhemorrhages in a predominantly peripheral distribution, consistent with cerebral amyloid angiopathy. Electronically Signed   By: Deatra RobinsonKevin  Herman M.D.   On: 07/15/2020 20:23     Labs:   Basic Metabolic Panel: Recent Labs  Lab 07/15/20 1354 07/16/20 0404 07/17/20 0854  NA 133* 133* 133*  K 3.6 3.5 3.5  CL 95* 97* 97*  CO2 29 25 29   GLUCOSE 127* 107* 148*  BUN 18 13 11   CREATININE 1.12 1.05 1.10  CALCIUM 9.1 9.0 9.2   GFR Estimated Creatinine Clearance: 64.8 mL/min (by C-G formula based on SCr of 1.1 mg/dL). Liver Function Tests: No results for input(s): AST, ALT, ALKPHOS, BILITOT, PROT, ALBUMIN in the last 168 hours. No results for input(s): LIPASE, AMYLASE in the last 168 hours. No results for input(s): AMMONIA in the last 168 hours. Coagulation profile No results for input(s): INR, PROTIME in the last 168 hours.  CBC: Recent Labs  Lab 07/15/20 1354 07/16/20 0404 07/17/20 0854  WBC 10.9* 11.9* 9.5  NEUTROABS 8.3*  --  7.9*  HGB 14.2 13.5 14.4  HCT 43.9 40.3 44.0  MCV 95.9 92.9 93.6  PLT 240 241 255  Cardiac Enzymes: No results for input(s): CKTOTAL, CKMB, CKMBINDEX, TROPONINI in the last 168 hours. BNP: Invalid input(s): POCBNP CBG: No results for input(s): GLUCAP in the last 168 hours. D-Dimer No results for input(s): DDIMER in the last 72 hours. Hgb A1c Recent Labs    07/17/20 0854  HGBA1C 6.2*   Lipid Profile Recent Labs    07/17/20 0854  CHOL 155  HDL 45  LDLCALC 98  TRIG 62  CHOLHDL 3.4   Thyroid function studies No results for input(s): TSH, T4TOTAL, T3FREE, THYROIDAB in the last 72 hours.  Invalid input(s): FREET3 Anemia work up No results for input(s): VITAMINB12, FOLATE, FERRITIN, TIBC, IRON, RETICCTPCT in the last 72 hours. Microbiology Recent Results  (from the past 240 hour(s))  SARS CORONAVIRUS 2 (TAT 6-24 HRS) Nasopharyngeal Nasopharyngeal Swab     Status: None   Collection Time: 07/15/20  5:18 PM   Specimen: Nasopharyngeal Swab  Result Value Ref Range Status   SARS Coronavirus 2 NEGATIVE NEGATIVE Final    Comment: (NOTE) SARS-CoV-2 target nucleic acids are NOT DETECTED.  The SARS-CoV-2 RNA is generally detectable in upper and lower respiratory specimens during the acute phase of infection. Negative results do not preclude SARS-CoV-2 infection, do not rule out co-infections with other pathogens, and should not be used as the sole basis for treatment or other patient management decisions. Negative results must be combined with clinical observations, patient history, and epidemiological information. The expected result is Negative.  Fact Sheet for Patients: HairSlick.no  Fact Sheet for Healthcare Providers: quierodirigir.com  This test is not yet approved or cleared by the [place] FDA and  has been authorized for detection and/or diagnosis of SARS-CoV-2 by FDA under an Emergency Use Authorization (EUA). This EUA will remain  in effect (meaning this test can be used) for the duration of the COVID-19 declaration under Se ction 564(b)(1) of the Act, 21 U.S.C. section 360bbb-3(b)(1), unless the authorization is terminated or revoked sooner.  Performed at [facility], [address] [place], [place] [address]      Signed: Lorin Glass  [facility] 07/17/2020, 12:08 PM

## 2020-07-21 DIAGNOSIS — R319 Hematuria, unspecified: Secondary | ICD-10-CM | POA: Diagnosis not present

## 2020-07-21 DIAGNOSIS — I498 Other specified cardiac arrhythmias: Secondary | ICD-10-CM | POA: Diagnosis not present

## 2020-07-21 DIAGNOSIS — N3281 Overactive bladder: Secondary | ICD-10-CM | POA: Diagnosis not present

## 2020-07-21 DIAGNOSIS — E871 Hypo-osmolality and hyponatremia: Secondary | ICD-10-CM | POA: Diagnosis not present

## 2020-07-21 DIAGNOSIS — I10 Essential (primary) hypertension: Secondary | ICD-10-CM | POA: Diagnosis not present

## 2020-07-21 DIAGNOSIS — I6930 Unspecified sequelae of cerebral infarction: Secondary | ICD-10-CM | POA: Diagnosis not present

## 2020-07-21 DIAGNOSIS — R3129 Other microscopic hematuria: Secondary | ICD-10-CM | POA: Diagnosis not present

## 2020-07-21 DIAGNOSIS — M1991 Primary osteoarthritis, unspecified site: Secondary | ICD-10-CM | POA: Diagnosis not present

## 2020-07-21 DIAGNOSIS — E6609 Other obesity due to excess calories: Secondary | ICD-10-CM | POA: Diagnosis not present

## 2020-07-21 DIAGNOSIS — Z6830 Body mass index (BMI) 30.0-30.9, adult: Secondary | ICD-10-CM | POA: Diagnosis not present

## 2020-07-22 DIAGNOSIS — N183 Chronic kidney disease, stage 3 unspecified: Secondary | ICD-10-CM | POA: Diagnosis not present

## 2020-07-22 DIAGNOSIS — E063 Autoimmune thyroiditis: Secondary | ICD-10-CM | POA: Diagnosis not present

## 2020-07-22 DIAGNOSIS — E7849 Other hyperlipidemia: Secondary | ICD-10-CM | POA: Diagnosis not present

## 2020-07-22 DIAGNOSIS — I129 Hypertensive chronic kidney disease with stage 1 through stage 4 chronic kidney disease, or unspecified chronic kidney disease: Secondary | ICD-10-CM | POA: Diagnosis not present

## 2020-07-27 NOTE — Progress Notes (Signed)
ICH score was 0 on admission.   Electronically signed: Dr. Caryl Pina
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
## 2020-08-08 ENCOUNTER — Other Ambulatory Visit: Payer: Self-pay

## 2020-08-08 ENCOUNTER — Encounter: Payer: Self-pay | Admitting: Physician Assistant

## 2020-08-08 ENCOUNTER — Ambulatory Visit: Payer: PPO | Admitting: Physician Assistant

## 2020-08-08 VITALS — BP 128/82 | HR 74 | Ht 68.0 in | Wt 200.0 lb

## 2020-08-08 DIAGNOSIS — I358 Other nonrheumatic aortic valve disorders: Secondary | ICD-10-CM

## 2020-08-08 DIAGNOSIS — I619 Nontraumatic intracerebral hemorrhage, unspecified: Secondary | ICD-10-CM

## 2020-08-08 DIAGNOSIS — I10 Essential (primary) hypertension: Secondary | ICD-10-CM

## 2020-08-08 DIAGNOSIS — R0989 Other specified symptoms and signs involving the circulatory and respiratory systems: Secondary | ICD-10-CM | POA: Diagnosis not present

## 2020-08-08 DIAGNOSIS — Z1211 Encounter for screening for malignant neoplasm of colon: Secondary | ICD-10-CM | POA: Diagnosis not present

## 2020-08-08 NOTE — Patient Instructions (Signed)
Medication Instructions:  Your physician recommends that you continue on your current medications as directed. Please refer to the Current Medication list given to you today.  *If you need a refill on your cardiac medications before your next appointment, please call your pharmacy*   Lab Work: None today  If you have labs (blood work) drawn today and your tests are completely normal, you will receive your results only by: Marland Kitchen MyChart Message (if you have MyChart) OR . A paper copy in the mail If you have any lab test that is abnormal or we need to change your treatment, we will call you to review the results.   Testing/Procedures: Your physician has requested that you have an echocardiogram. Echocardiography is a painless test that uses sound waves to create images of your heart. It provides your doctor with information about the size and shape of your heart and how well your heart's chambers and valves are working. This procedure takes approximately one hour. There are no restrictions for this procedure.     Follow-Up: At [facility], you and your health needs are our priority.  As part of our continuing mission to provide you with exceptional heart care, we have created designated Provider Care Teams.  These Care Teams include your primary Cardiologist (physician) and Advanced Practice Providers (APPs -  Physician Assistants and Nurse Practitioners) who all work together to provide you with the care you need, when you need it.  We recommend signing up for the patient portal called "MyChart".  Sign up information is provided on this After Visit Summary.  MyChart is used to connect with patients for Virtual Visits (Telemedicine).  Patients are able to view lab/test results, encounter notes, upcoming appointments, etc.  Non-urgent messages can be sent to your provider as well.   To learn more about what you can do with MyChart, go to [email or web address].    Your next appointment:   6  week(s)  The format for your next appointment:   In Person  Provider:   You may see Nona Dell, MD or one of the following Advanced Practice Providers on your designated Care Team:    Randall An, PA-C   Jacolyn Reedy, PA-C     Other Instructions None

## 2020-08-09 DIAGNOSIS — I6930 Unspecified sequelae of cerebral infarction: Secondary | ICD-10-CM | POA: Diagnosis not present

## 2020-08-09 DIAGNOSIS — I10 Essential (primary) hypertension: Secondary | ICD-10-CM | POA: Diagnosis not present

## 2020-08-09 DIAGNOSIS — E6609 Other obesity due to excess calories: Secondary | ICD-10-CM | POA: Diagnosis not present

## 2020-08-09 DIAGNOSIS — N400 Enlarged prostate without lower urinary tract symptoms: Secondary | ICD-10-CM | POA: Diagnosis not present

## 2020-08-09 DIAGNOSIS — Z6830 Body mass index (BMI) 30.0-30.9, adult: Secondary | ICD-10-CM | POA: Diagnosis not present

## 2020-08-17 ENCOUNTER — Other Ambulatory Visit: Payer: Self-pay

## 2020-08-17 ENCOUNTER — Ambulatory Visit (HOSPITAL_COMMUNITY)
Admission: RE | Admit: 2020-08-17 | Discharge: 2020-08-17 | Disposition: A | Discharge status: Home / Self Care | Payer: PPO | Source: Ambulatory Visit | Attending: Physician Assistant | Admitting: Physician Assistant

## 2020-08-17 DIAGNOSIS — I358 Other nonrheumatic aortic valve disorders: Secondary | ICD-10-CM | POA: Diagnosis not present

## 2020-08-17 LAB — ECHOCARDIOGRAM COMPLETE
Area-P 1/2: 2.38 cm2
S' Lateral: 3.94 cm

## 2020-08-17 NOTE — Progress Notes (Signed)
*  PRELIMINARY RESULTS* Echocardiogram 2D Echocardiogram has been performed.  Jeryl Columbia 08/17/2020, 2:33 PM

## 2020-08-18 ENCOUNTER — Telehealth: Payer: Self-pay

## 2020-08-18 DIAGNOSIS — I358 Other nonrheumatic aortic valve disorders: Secondary | ICD-10-CM

## 2020-08-18 MED ORDER — LOSARTAN POTASSIUM 25 MG PO TABS: 25.0000 mg | ORAL_TABLET | Freq: Every day | ORAL | 3 refills | Status: DC

## 2020-08-18 NOTE — Telephone Encounter (Signed)
-----   Message from Dyann Kief, PA-C sent at 08/18/2020  9:47 AM EDT ----- Echo shows heart has mild decrease in function.Aortic valve unchanged.Would get lexiscan myoview to rule out ischemia. Would benefit from low dose losartan if his BP will tolerate. Try losartan 25 mg once daily. He can keep a close eye on BP and f/u bmet in 2 weeks.keep f/u in June thanks

## 2020-08-18 NOTE — Telephone Encounter (Signed)
Pt notified and verbalized understanding. Pt had no questions or concerns at this time. Orders placed per Leda Gauze PA-C's request.

## 2020-08-22 DIAGNOSIS — E063 Autoimmune thyroiditis: Secondary | ICD-10-CM | POA: Diagnosis not present

## 2020-08-22 DIAGNOSIS — N183 Chronic kidney disease, stage 3 unspecified: Secondary | ICD-10-CM | POA: Diagnosis not present

## 2020-08-22 DIAGNOSIS — E7849 Other hyperlipidemia: Secondary | ICD-10-CM | POA: Diagnosis not present

## 2020-08-22 DIAGNOSIS — I129 Hypertensive chronic kidney disease with stage 1 through stage 4 chronic kidney disease, or unspecified chronic kidney disease: Secondary | ICD-10-CM | POA: Diagnosis not present

## 2020-08-23 ENCOUNTER — Telehealth: Payer: Self-pay | Admitting: Physician Assistant

## 2020-08-23 MED ORDER — LOSARTAN POTASSIUM 25 MG PO TABS: 25.0000 mg | ORAL_TABLET | Freq: Two times a day (BID) | ORAL | 3 refills | Status: DC

## 2020-08-23 NOTE — Telephone Encounter (Signed)
151/91 at 9am 172/98 10:35am He was seen by Jacolyn Reedy for irregular heart rate and she prescribed him prescribing losartan (COZAAR) 25 MG tablet. Please call Ivor Messier (wife) #[phone]

## 2020-08-23 NOTE — Telephone Encounter (Signed)
Patient started Losartan 25 mg daily on 08/18/20 and wife reports good blood pressures until today. She just retook BP and got 162/98. She is very anxious about elevated BP after his brain bleed. lexiscan scheduled for 08/29/20

## 2020-08-23 NOTE — Telephone Encounter (Signed)
Spoke with wife. They will increase losartan to 25 mg BID, e-scribed to [facility].They will continue to follow BP and low sodium diet.

## 2020-08-23 NOTE — Telephone Encounter (Signed)
BP Readings for 08/23/20  151/91 at 9am 172/98 10:35am

## 2020-08-23 NOTE — Telephone Encounter (Signed)
[name](Patient's wife) called concerned about her husband's BP this morning being high. She is not sure if it is related to Ryan [name] prescribing losartan (COZAAR) 25 MG tablet. She would like to know what he should do? She can be reached at #[phone].

## 2020-08-23 NOTE — Telephone Encounter (Signed)
I just send you a message to increase losartan 25 mg bid, low sodium and keep check on BP. Thanks. Should already be scheduled for f/u bmet

## 2020-08-29 ENCOUNTER — Encounter (HOSPITAL_COMMUNITY)
Admission: RE | Admit: 2020-08-29 | Discharge: 2020-08-29 | Disposition: A | Discharge status: Home / Self Care | Payer: PPO | Source: Ambulatory Visit | Attending: Physician Assistant | Admitting: Physician Assistant

## 2020-08-29 ENCOUNTER — Encounter (HOSPITAL_BASED_OUTPATIENT_CLINIC_OR_DEPARTMENT_OTHER)
Admission: RE | Admit: 2020-08-29 | Discharge: 2020-08-29 | Disposition: A | Discharge status: Home / Self Care | Payer: PPO | Source: Ambulatory Visit | Attending: Physician Assistant | Admitting: Physician Assistant

## 2020-08-29 DIAGNOSIS — I358 Other nonrheumatic aortic valve disorders: Secondary | ICD-10-CM

## 2020-08-29 LAB — NM MYOCAR MULTI W/SPECT W/WALL MOTION / EF
LV dias vol: 89 mL (ref 62–150)
LV sys vol: 44 mL
Peak HR: 114 {beats}/min
RATE: 0.37
Rest HR: 66 {beats}/min
SDS: 0
SRS: 4
SSS: 4
TID: 0.95

## 2020-08-29 MED ORDER — SODIUM CHLORIDE FLUSH 0.9 % IV SOLN
INTRAVENOUS | Status: AC
  Administered 2020-08-29: 10 mL via INTRAVENOUS
  Filled 2020-08-29: qty 10

## 2020-08-29 MED ORDER — TECHNETIUM TC 99M TETROFOSMIN IV KIT
30.0000 | PACK | Freq: Once | INTRAVENOUS | Status: AC | PRN
  Administered 2020-08-29: 32 via INTRAVENOUS

## 2020-08-29 MED ORDER — REGADENOSON 0.4 MG/5ML IV SOLN
INTRAVENOUS | Status: AC
  Administered 2020-08-29: 0.4 mg via INTRAVENOUS
  Filled 2020-08-29: qty 5

## 2020-08-29 MED ORDER — TECHNETIUM TC 99M TETROFOSMIN IV KIT
10.0000 | PACK | Freq: Once | INTRAVENOUS | Status: AC | PRN
  Administered 2020-08-29: 10.48 via INTRAVENOUS

## 2020-09-01 ENCOUNTER — Other Ambulatory Visit (HOSPITAL_COMMUNITY)
Admission: RE | Admit: 2020-09-01 | Discharge: 2020-09-01 | Disposition: A | Discharge status: Home / Self Care | Payer: PPO | Source: Ambulatory Visit | Attending: Physician Assistant | Admitting: Physician Assistant

## 2020-09-01 ENCOUNTER — Telehealth: Payer: Self-pay

## 2020-09-01 ENCOUNTER — Other Ambulatory Visit: Payer: Self-pay

## 2020-09-01 DIAGNOSIS — I358 Other nonrheumatic aortic valve disorders: Secondary | ICD-10-CM | POA: Insufficient documentation

## 2020-09-01 LAB — BASIC METABOLIC PANEL
Anion gap: 5 (ref 5–15)
BUN: 17 mg/dL (ref 8–23)
CO2: 31 mmol/L (ref 22–32)
Calcium: 9.1 mg/dL (ref 8.9–10.3)
Chloride: 101 mmol/L (ref 98–111)
Creatinine, Ser: 1.14 mg/dL (ref 0.61–1.24)
GFR, Estimated: >60 mL/min (ref >60)
Glucose, Bld: 110 mg/dL — ABNORMAL HIGH (ref 70–99)
Potassium: 4.3 mmol/L (ref 3.5–5.1)
Sodium: 137 mmol/L (ref 135–145)

## 2020-09-01 NOTE — Telephone Encounter (Signed)
Pt notified and voiced understanding 

## 2020-09-01 NOTE — Telephone Encounter (Signed)
-----   Message from Dyann Kief, PA-C sent at 09/01/2020  1:54 PM EDT ----- Please inform patient labs are stable. No changes. thanks A copey of the test result will also be sent to Elfredia Nevins, MD
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
## 2020-09-11 ENCOUNTER — Other Ambulatory Visit: Payer: Self-pay

## 2020-09-11 ENCOUNTER — Ambulatory Visit: Payer: PPO | Admitting: Physician Assistant

## 2020-09-11 ENCOUNTER — Encounter: Payer: Self-pay | Admitting: Physician Assistant

## 2020-09-11 VITALS — BP 122/80 | HR 63 | Ht 68.0 in | Wt 200.4 lb

## 2020-09-11 DIAGNOSIS — I619 Nontraumatic intracerebral hemorrhage, unspecified: Secondary | ICD-10-CM | POA: Diagnosis not present

## 2020-09-11 DIAGNOSIS — I10 Essential (primary) hypertension: Secondary | ICD-10-CM

## 2020-09-11 DIAGNOSIS — R0989 Other specified symptoms and signs involving the circulatory and respiratory systems: Secondary | ICD-10-CM | POA: Diagnosis not present

## 2020-09-11 DIAGNOSIS — I358 Other nonrheumatic aortic valve disorders: Secondary | ICD-10-CM

## 2020-09-11 NOTE — Patient Instructions (Signed)
Medication Instructions:  Your physician recommends that you continue on your current medications as directed. Please refer to the Current Medication list given to you today.  *If you need a refill on your cardiac medications before your next appointment, please call your pharmacy*   Lab Work: None If you have labs (blood work) drawn today and your tests are completely normal, you will receive your results only by: MyChart Message (if you have MyChart) OR A paper copy in the mail If you have any lab test that is abnormal or we need to change your treatment, we will call you to review the results.   Testing/Procedures: None   Follow-Up: At [facility], you and your health needs are our priority.  As part of our continuing mission to provide you with exceptional heart care, we have created designated Provider Care Teams.  These Care Teams include your primary Cardiologist (physician) and Advanced Practice Providers (APPs -  Physician Assistants and Nurse Practitioners) who all work together to provide you with the care you need, when you need it.  We recommend signing up for the patient portal called "MyChart".  Sign up information is provided on this After Visit Summary.  MyChart is used to connect with patients for Virtual Visits (Telemedicine).  Patients are able to view lab/test results, encounter notes, upcoming appointments, etc.  Non-urgent messages can be sent to your provider as well.   To learn more about what you can do with MyChart, go to [email or web address].    Your next appointment:   4-5 month(s)  The format for your next appointment:   In Person  Provider:   Nona Dell, MD   Other Instructions You have been referred to Neurology with Dr. Kirtland Bouchard. Gerilyn Pilgrim. Their office will give you a call to schedule your first appointment.

## 2020-09-19 ENCOUNTER — Other Ambulatory Visit: Payer: Self-pay

## 2020-09-19 ENCOUNTER — Ambulatory Visit: Payer: PPO | Admitting: Urology

## 2020-09-19 ENCOUNTER — Encounter: Payer: Self-pay | Admitting: Urology

## 2020-09-19 VITALS — BP 122/79 | HR 81 | Temp 97.5°F | Wt 200.6 lb

## 2020-09-19 DIAGNOSIS — R310 Gross hematuria: Secondary | ICD-10-CM | POA: Diagnosis not present

## 2020-09-19 DIAGNOSIS — N401 Enlarged prostate with lower urinary tract symptoms: Secondary | ICD-10-CM | POA: Insufficient documentation

## 2020-09-19 DIAGNOSIS — R350 Frequency of micturition: Secondary | ICD-10-CM | POA: Diagnosis not present

## 2020-09-19 DIAGNOSIS — N138 Other obstructive and reflux uropathy: Secondary | ICD-10-CM | POA: Diagnosis not present

## 2020-09-19 LAB — URINALYSIS, ROUTINE W REFLEX MICROSCOPIC
Bilirubin, UA: NEGATIVE
Glucose, UA: NEGATIVE
Ketones, UA: NEGATIVE
Leukocytes,UA: NEGATIVE
Nitrite, UA: NEGATIVE
Protein,UA: NEGATIVE
Specific Gravity, UA: 1.01 (ref 1.005–1.030)
Urobilinogen, Ur: 0.2 mg/dL (ref 0.2–1.0)
pH, UA: 7 (ref 5.0–7.5)

## 2020-09-19 LAB — MICROSCOPIC EXAMINATION
Bacteria, UA: NONE SEEN
Epithelial Cells (non renal): NONE SEEN /hpf (ref 0–10)
Renal Epithel, UA: NONE SEEN /hpf
WBC, UA: NONE SEEN /hpf (ref 0–5)

## 2020-09-19 MED ORDER — ALFUZOSIN HCL ER 10 MG PO TB24: 10.0000 mg | ORAL_TABLET | Freq: Every day | ORAL | 11 refills | Status: DC

## 2020-09-19 NOTE — Progress Notes (Signed)
Urological Symptom Review  Patient is experiencing the following symptoms: Frequent urination   Review of Systems  Gastrointestinal (upper)  : Negative for upper GI symptoms  Gas  Negative for lower GI symptoms constipation  Constitutional : Negative for symptoms  Skin: Negative for skin symptoms  Eyes: Negative for eye symptoms  Ear/Nose/Throat : Negative for Ear/Nose/Throat symptoms  Hematologic/Lymphatic: Negative for Hematologic/Lymphatic symptoms  Cardiovascular : Negative for cardiovascular symptoms  Respiratory : Cough  Endocrine: Negative for endocrine symptoms  Musculoskeletal: Back pain Joint pain  Neurological: Negative for neurological symptoms  Psychologic: Negative for psychiatric symptoms

## 2020-09-19 NOTE — Patient Instructions (Signed)
Benign Prostatic Hyperplasia  Benign prostatic hyperplasia (BPH) is an enlarged prostate gland that is caused by the normal aging process and not by cancer. The prostate is a walnut-sized gland that is involved in the production of semen. It is located in front of the rectum and below the bladder. The bladder stores urine and the urethra is the tube that carries the urine out of the body. The prostate may get bigger asa man gets older. An enlarged prostate can press on the urethra. This can make it harder to pass urine. The build-up of urine in the bladder can cause infection. Back pressure and infection may progress to bladder damage and kidney (renal) failure. What are the causes? This condition is part of a normal aging process. However, not all men develop problems from this condition. If the prostate enlarges away from the urethra, urine flow will not be blocked. If it enlarges toward the urethra andcompresses it, there will be problems passing urine. What increases the risk? This condition is more likely to develop in men over the age of 50 years. What are the signs or symptoms? Symptoms of this condition include: Getting up often during the night to urinate. Needing to urinate frequently during the day. Difficulty starting urine flow. Decrease in size and strength of your urine stream. Leaking (dribbling) after urinating. Inability to pass urine. This needs immediate treatment. Inability to completely empty your bladder. Pain when you pass urine. This is more common if there is also an infection. Urinary tract infection (UTI). How is this diagnosed? This condition is diagnosed based on your medical history, a physical exam, and your symptoms. Tests will also be done, such as: A post-void bladder scan. This measures any amount of urine that may remain in your bladder after you finish urinating. A digital rectal exam. In a rectal exam, your health care provider checks your prostate by  putting a lubricated, gloved finger into your rectum to feel the back of your prostate gland. This exam detects the size of your gland and any abnormal lumps or growths. An exam of your urine (urinalysis). A prostate specific antigen (PSA) screening. This is a blood test used to screen for prostate cancer. An ultrasound. This test uses sound waves to electronically produce a picture of your prostate gland. Your health care provider may refer you to a specialist in kidney and prostate diseases (urologist). How is this treated? Once symptoms begin, your health care provider will monitor your condition (active surveillance or watchful waiting). Treatment for this condition will depend on the severity of your condition. Treatment may include: Observation and yearly exams. This may be the only treatment needed if your condition and symptoms are mild. Medicines to relieve your symptoms, including: Medicines to shrink the prostate. Medicines to relax the muscle of the prostate. Surgery in severe cases. Surgery may include: Prostatectomy. In this procedure, the prostate tissue is removed completely through an open incision or with a laparoscope or robotics. Transurethral resection of the prostate (TURP). In this procedure, a tool is inserted through the opening at the tip of the penis (urethra). It is used to cut away tissue of the inner core of the prostate. The pieces are removed through the same opening of the penis. This removes the blockage. Transurethral incision (TUIP). In this procedure, small cuts are made in the prostate. This lessens the prostate's pressure on the urethra. Transurethral microwave thermotherapy (TUMT). This procedure uses microwaves to create heat. The heat destroys and removes a small   amount of prostate tissue. Transurethral needle ablation (TUNA). This procedure uses radio frequencies to destroy and remove a small amount of prostate tissue. Interstitial laser coagulation (ILC).  This procedure uses a laser to destroy and remove a small amount of prostate tissue. Transurethral electrovaporization (TUVP). This procedure uses electrodes to destroy and remove a small amount of prostate tissue. Prostatic urethral lift. This procedure inserts an implant to push the lobes of the prostate away from the urethra. Follow these instructions at home: Take over-the-counter and prescription medicines only as told by your health care provider. Monitor your symptoms for any changes. Contact your health care provider with any changes. Avoid drinking large amounts of liquid before going to bed or out in public. Avoid or reduce how much caffeine or alcohol you drink. Give yourself time when you urinate. Keep all follow-up visits as told by your health care provider. This is important. Contact a health care provider if: You have unexplained back pain. Your symptoms do not get better with treatment. You develop side effects from the medicine you are taking. Your urine becomes very dark or has a bad smell. Your lower abdomen becomes distended and you have trouble passing your urine. Get help right away if: You have a fever or chills. You suddenly cannot urinate. You feel lightheaded, or very dizzy, or you faint. There are large amounts of blood or clots in the urine. Your urinary problems become hard to manage. You develop moderate to severe low back or flank pain. The flank is the side of your body between the ribs and the hip. These symptoms may represent a serious problem that is an emergency. Do not wait to see if the symptoms will go away. Get medical help right away. Call your local emergency services (911 in the [place] Do not drive yourself to the hospital. Summary Benign prostatic hyperplasia (BPH) is an enlarged prostate that is caused by the normal aging process and not by cancer. An enlarged prostate can press on the urethra. This can make it hard to pass urine. This  condition is part of a normal aging process and is more likely to develop in men over the age of 50 years. Get help right away if you suddenly cannot urinate. This information is not intended to replace advice given to you by your health care provider. Make sure you discuss any questions you have with your healthcare provider. Document Revised: 11/18/2019 Document Reviewed: 11/18/2019 Elsevier Patient Education  2022 [employer]  
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
## 2020-09-21 DIAGNOSIS — I129 Hypertensive chronic kidney disease with stage 1 through stage 4 chronic kidney disease, or unspecified chronic kidney disease: Secondary | ICD-10-CM | POA: Diagnosis not present

## 2020-09-21 DIAGNOSIS — E063 Autoimmune thyroiditis: Secondary | ICD-10-CM | POA: Diagnosis not present

## 2020-09-21 DIAGNOSIS — E7849 Other hyperlipidemia: Secondary | ICD-10-CM | POA: Diagnosis not present

## 2020-09-21 DIAGNOSIS — N183 Chronic kidney disease, stage 3 unspecified: Secondary | ICD-10-CM | POA: Diagnosis not present

## 2020-10-13 ENCOUNTER — Ambulatory Visit: Payer: PPO | Admitting: Orthopedic Surgery

## 2020-10-16 ENCOUNTER — Ambulatory Visit: Payer: PPO | Admitting: Orthopedic Surgery

## 2020-10-17 DIAGNOSIS — E6609 Other obesity due to excess calories: Secondary | ICD-10-CM | POA: Diagnosis not present

## 2020-10-17 DIAGNOSIS — I69198 Other sequelae of nontraumatic intracerebral hemorrhage: Secondary | ICD-10-CM | POA: Diagnosis not present

## 2020-10-17 DIAGNOSIS — Z6830 Body mass index (BMI) 30.0-30.9, adult: Secondary | ICD-10-CM | POA: Diagnosis not present

## 2020-10-17 DIAGNOSIS — I10 Essential (primary) hypertension: Secondary | ICD-10-CM | POA: Diagnosis not present

## 2020-10-17 DIAGNOSIS — M1991 Primary osteoarthritis, unspecified site: Secondary | ICD-10-CM | POA: Diagnosis not present

## 2020-10-17 DIAGNOSIS — E063 Autoimmune thyroiditis: Secondary | ICD-10-CM | POA: Diagnosis not present

## 2020-11-01 ENCOUNTER — Other Ambulatory Visit: Payer: Self-pay

## 2020-11-01 ENCOUNTER — Encounter: Payer: Self-pay | Admitting: Urology

## 2020-11-01 ENCOUNTER — Ambulatory Visit: Payer: PPO | Admitting: Urology

## 2020-11-01 VITALS — BP 142/80 | HR 72 | Ht 68.5 in | Wt 201.5 lb

## 2020-11-01 DIAGNOSIS — N401 Enlarged prostate with lower urinary tract symptoms: Secondary | ICD-10-CM

## 2020-11-01 DIAGNOSIS — N138 Other obstructive and reflux uropathy: Secondary | ICD-10-CM

## 2020-11-01 DIAGNOSIS — R310 Gross hematuria: Secondary | ICD-10-CM | POA: Diagnosis not present

## 2020-11-01 DIAGNOSIS — R351 Nocturia: Secondary | ICD-10-CM

## 2020-11-01 LAB — URINALYSIS, ROUTINE W REFLEX MICROSCOPIC
Bilirubin, UA: NEGATIVE
Glucose, UA: NEGATIVE
Ketones, UA: NEGATIVE
Leukocytes,UA: NEGATIVE
Nitrite, UA: NEGATIVE
Protein,UA: NEGATIVE
Specific Gravity, UA: 1.015 (ref 1.005–1.030)
Urobilinogen, Ur: 0.2 mg/dL (ref 0.2–1.0)
pH, UA: 7 (ref 5.0–7.5)

## 2020-11-01 LAB — MICROSCOPIC EXAMINATION
Bacteria, UA: NONE SEEN
Epithelial Cells (non renal): NONE SEEN /hpf (ref 0–10)
Renal Epithel, UA: NONE SEEN /hpf
WBC, UA: NONE SEEN /hpf (ref 0–5)

## 2020-11-01 LAB — BLADDER SCAN AMB NON-IMAGING: Scan Result: 170

## 2020-11-01 NOTE — Progress Notes (Signed)
post void residual = 170 ml

## 2020-11-01 NOTE — Patient Instructions (Signed)
Benign Prostatic Hyperplasia  Benign prostatic hyperplasia (BPH) is an enlarged prostate gland that is caused by the normal aging process and not by cancer. The prostate is a walnut-sized gland that is involved in the production of semen. It is located in front of the rectum and below the bladder. The bladder stores urine and the urethra is the tube that carries the urine out of the body. The prostate may get bigger asa man gets older. An enlarged prostate can press on the urethra. This can make it harder to pass urine. The build-up of urine in the bladder can cause infection. Back pressure and infection may progress to bladder damage and kidney (renal) failure. What are the causes? This condition is part of a normal aging process. However, not all men develop problems from this condition. If the prostate enlarges away from the urethra, urine flow will not be blocked. If it enlarges toward the urethra andcompresses it, there will be problems passing urine. What increases the risk? This condition is more likely to develop in men over the age of 50 years. What are the signs or symptoms? Symptoms of this condition include: Getting up often during the night to urinate. Needing to urinate frequently during the day. Difficulty starting urine flow. Decrease in size and strength of your urine stream. Leaking (dribbling) after urinating. Inability to pass urine. This needs immediate treatment. Inability to completely empty your bladder. Pain when you pass urine. This is more common if there is also an infection. Urinary tract infection (UTI). How is this diagnosed? This condition is diagnosed based on your medical history, a physical exam, and your symptoms. Tests will also be done, such as: A post-void bladder scan. This measures any amount of urine that may remain in your bladder after you finish urinating. A digital rectal exam. In a rectal exam, your health care provider checks your prostate by  putting a lubricated, gloved finger into your rectum to feel the back of your prostate gland. This exam detects the size of your gland and any abnormal lumps or growths. An exam of your urine (urinalysis). A prostate specific antigen (PSA) screening. This is a blood test used to screen for prostate cancer. An ultrasound. This test uses sound waves to electronically produce a picture of your prostate gland. Your health care provider may refer you to a specialist in kidney and prostate diseases (urologist). How is this treated? Once symptoms begin, your health care provider will monitor your condition (active surveillance or watchful waiting). Treatment for this condition will depend on the severity of your condition. Treatment may include: Observation and yearly exams. This may be the only treatment needed if your condition and symptoms are mild. Medicines to relieve your symptoms, including: Medicines to shrink the prostate. Medicines to relax the muscle of the prostate. Surgery in severe cases. Surgery may include: Prostatectomy. In this procedure, the prostate tissue is removed completely through an open incision or with a laparoscope or robotics. Transurethral resection of the prostate (TURP). In this procedure, a tool is inserted through the opening at the tip of the penis (urethra). It is used to cut away tissue of the inner core of the prostate. The pieces are removed through the same opening of the penis. This removes the blockage. Transurethral incision (TUIP). In this procedure, small cuts are made in the prostate. This lessens the prostate's pressure on the urethra. Transurethral microwave thermotherapy (TUMT). This procedure uses microwaves to create heat. The heat destroys and removes a small   amount of prostate tissue. Transurethral needle ablation (TUNA). This procedure uses radio frequencies to destroy and remove a small amount of prostate tissue. Interstitial laser coagulation (ILC).  This procedure uses a laser to destroy and remove a small amount of prostate tissue. Transurethral electrovaporization (TUVP). This procedure uses electrodes to destroy and remove a small amount of prostate tissue. Prostatic urethral lift. This procedure inserts an implant to push the lobes of the prostate away from the urethra. Follow these instructions at home: Take over-the-counter and prescription medicines only as told by your health care provider. Monitor your symptoms for any changes. Contact your health care provider with any changes. Avoid drinking large amounts of liquid before going to bed or out in public. Avoid or reduce how much caffeine or alcohol you drink. Give yourself time when you urinate. Keep all follow-up visits as told by your health care provider. This is important. Contact a health care provider if: You have unexplained back pain. Your symptoms do not get better with treatment. You develop side effects from the medicine you are taking. Your urine becomes very dark or has a bad smell. Your lower abdomen becomes distended and you have trouble passing your urine. Get help right away if: You have a fever or chills. You suddenly cannot urinate. You feel lightheaded, or very dizzy, or you faint. There are large amounts of blood or clots in the urine. Your urinary problems become hard to manage. You develop moderate to severe low back or flank pain. The flank is the side of your body between the ribs and the hip. These symptoms may represent a serious problem that is an emergency. Do not wait to see if the symptoms will go away. Get medical help right away. Call your local emergency services (911 in the [place] Do not drive yourself to the hospital. Summary Benign prostatic hyperplasia (BPH) is an enlarged prostate that is caused by the normal aging process and not by cancer. An enlarged prostate can press on the urethra. This can make it hard to pass urine. This  condition is part of a normal aging process and is more likely to develop in men over the age of 50 years. Get help right away if you suddenly cannot urinate. This information is not intended to replace advice given to you by your health care provider. Make sure you discuss any questions you have with your healthcare provider. Document Revised: 11/18/2019 Document Reviewed: 11/18/2019 Elsevier Patient Education  2022 [employer]  
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
## 2020-11-01 NOTE — Progress Notes (Signed)
Urological Symptom Review  Patient is experiencing the following symptoms: Frequent urination Hard to postpone urination Get up at night to urinate   Review of Systems  Gastrointestinal (upper)  : Negative for upper GI symptoms  Gastrointestinal (lower) : Negative for lower GI symptoms  Constitutional : Fatigue  Skin: Negative for skin symptoms  Eyes: Negative for eye symptoms  Ear/Nose/Throat : Negative for Ear/Nose/Throat symptoms  Hematologic/Lymphatic: Easy bruising  Cardiovascular : Negative for cardiovascular symptoms  Respiratory : Shortness of breath  Endocrine: Negative for endocrine symptoms  Musculoskeletal: Back pain Joint pain  Neurological: Negative for neurological symptoms  Psychologic: Negative for psychiatric symptoms

## 2020-11-02 ENCOUNTER — Encounter: Payer: Self-pay | Admitting: Orthopedic Surgery

## 2020-11-02 ENCOUNTER — Ambulatory Visit: Payer: PPO

## 2020-11-02 ENCOUNTER — Ambulatory Visit: Payer: PPO | Admitting: Orthopedic Surgery

## 2020-11-02 VITALS — BP 136/75 | HR 82 | Ht 68.0 in | Wt 200.0 lb

## 2020-11-02 DIAGNOSIS — M1610 Unilateral primary osteoarthritis, unspecified hip: Secondary | ICD-10-CM

## 2020-11-02 DIAGNOSIS — M1612 Unilateral primary osteoarthritis, left hip: Secondary | ICD-10-CM

## 2020-11-02 DIAGNOSIS — Z96642 Presence of left artificial hip joint: Secondary | ICD-10-CM | POA: Diagnosis not present

## 2020-11-02 NOTE — Progress Notes (Signed)
Chief Complaint  Patient presents with   Post-op Follow-up    Left hip replacement 09/11/16 states feels good   75 year old male status post left total hip right total knee in 2015 left total knee 2000 405  Has no issues with his left knee  Review of systems recent hemorrhagic event cerebral hemorrhage he recovered  His exam shows normal range of motion his left hip  His x-ray looks good with no signs of loosening  Recommend x-ray in 1 year  Encounter Diagnoses  Name Primary?   S/P hip replacement, left 09/11/16 Yes   Hip arthritis

## 2020-11-14 DIAGNOSIS — G4733 Obstructive sleep apnea (adult) (pediatric): Secondary | ICD-10-CM | POA: Diagnosis not present

## 2020-11-14 DIAGNOSIS — I10 Essential (primary) hypertension: Secondary | ICD-10-CM | POA: Diagnosis not present

## 2020-11-14 DIAGNOSIS — M130 Polyarthritis, unspecified: Secondary | ICD-10-CM | POA: Diagnosis not present

## 2020-11-14 DIAGNOSIS — I619 Nontraumatic intracerebral hemorrhage, unspecified: Secondary | ICD-10-CM | POA: Diagnosis not present

## 2020-11-14 DIAGNOSIS — R2689 Other abnormalities of gait and mobility: Secondary | ICD-10-CM | POA: Diagnosis not present

## 2020-11-14 DIAGNOSIS — I680 Cerebral amyloid angiopathy: Secondary | ICD-10-CM | POA: Diagnosis not present

## 2020-11-15 DIAGNOSIS — Z6830 Body mass index (BMI) 30.0-30.9, adult: Secondary | ICD-10-CM | POA: Diagnosis not present

## 2020-11-15 DIAGNOSIS — R5383 Other fatigue: Secondary | ICD-10-CM | POA: Diagnosis not present

## 2020-11-15 DIAGNOSIS — R0683 Snoring: Secondary | ICD-10-CM | POA: Diagnosis not present

## 2020-11-15 DIAGNOSIS — J22 Unspecified acute lower respiratory infection: Secondary | ICD-10-CM | POA: Diagnosis not present

## 2020-11-15 DIAGNOSIS — E6609 Other obesity due to excess calories: Secondary | ICD-10-CM | POA: Diagnosis not present

## 2020-11-20 ENCOUNTER — Other Ambulatory Visit (HOSPITAL_BASED_OUTPATIENT_CLINIC_OR_DEPARTMENT_OTHER): Payer: Self-pay

## 2020-11-20 DIAGNOSIS — G4733 Obstructive sleep apnea (adult) (pediatric): Secondary | ICD-10-CM

## 2021-01-01 ENCOUNTER — Ambulatory Visit: Payer: PPO | Admitting: Urology
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
## 2021-01-12 ENCOUNTER — Encounter: Payer: Self-pay | Admitting: Cardiology

## 2021-01-12 ENCOUNTER — Ambulatory Visit: Payer: PPO | Admitting: Cardiology

## 2021-01-12 VITALS — BP 126/78 | HR 80 | Ht 68.0 in | Wt 200.2 lb

## 2021-01-12 DIAGNOSIS — I429 Cardiomyopathy, unspecified: Secondary | ICD-10-CM

## 2021-01-12 DIAGNOSIS — I358 Other nonrheumatic aortic valve disorders: Secondary | ICD-10-CM | POA: Diagnosis not present

## 2021-01-12 DIAGNOSIS — I10 Essential (primary) hypertension: Secondary | ICD-10-CM | POA: Diagnosis not present

## 2021-01-12 NOTE — Patient Instructions (Signed)

## 2021-01-15 ENCOUNTER — Ambulatory Visit: Payer: PPO | Attending: Neurology | Admitting: Neurology

## 2021-01-15 DIAGNOSIS — G4733 Obstructive sleep apnea (adult) (pediatric): Secondary | ICD-10-CM | POA: Diagnosis not present

## 2021-01-16 NOTE — Procedures (Signed)
  [facility] Egon Dittus A. Gerilyn Pilgrim, MD     [email or web address]             NOCTURNAL POLYSOMNOGRAPHY   LOCATION: [facility]  Patient Name: Ryan [name], Ryan [name] Date: 01/15/2021 Gender: Male D.O.B: 04/07/1945 Age (years): 64 Referring Provider: Beryle Beams MD, ABSM Height (inches): 68 Interpreting Physician: Beryle Beams MD, ABSM Weight (lbs): 200 RPSGT: Alfonso Ellis BMI: 30 MRN: [id number] Neck Size: 18.50 CLINICAL INFORMATION Sleep Study Type: NPSG     Indication for sleep study: N/A     Epworth Sleepiness Score: 3     SLEEP STUDY TECHNIQUE As per the AASM Manual for the Scoring of Sleep and Associated Events v2.3 (April 2016) with a hypopnea requiring 4% desaturations.  The channels recorded and monitored were frontal, central and occipital EEG, electrooculogram (EOG), submentalis EMG (chin), nasal and oral airflow, thoracic and abdominal wall motion, anterior tibialis EMG, snore microphone, electrocardiogram, and pulse oximetry.  MEDICATIONS Medications self-administered by patient taken the night of the study : N/A  Current Outpatient Medications:    alfuzosin (UROXATRAL) 10 MG 24 hr tablet, Take 1 tablet (10 mg total) by mouth at bedtime., Disp: 30 tablet, Rfl: 11   hydrochlorothiazide (MICROZIDE) 12.5 MG capsule, Take 12.5 mg by mouth every morning., Disp: , Rfl:    levothyroxine (SYNTHROID, LEVOTHROID) 25 MCG tablet, Take 25 mcg by mouth daily before breakfast., Disp: , Rfl:    losartan (COZAAR) 25 MG tablet, Take 1 tablet (25 mg total) by mouth in the morning and at bedtime., Disp: 180 tablet, Rfl: 3   melatonin 3 MG TABS tablet, Take 1 tablet (3 mg total) by mouth at bedtime as needed (insomnia)., Disp: , Rfl: 0   rosuvastatin (CRESTOR) 10 MG tablet, Take 10 mg by mouth daily., Disp: , Rfl:    tamsulosin (FLOMAX) 0.4 MG CAPS capsule, Take 0.4 mg by mouth daily., Disp: , Rfl:      SLEEP ARCHITECTURE The study was initiated at 10:30:58 PM and  ended at 5:23:28 AM.  Sleep onset time was 42.8 minutes and the sleep efficiency was 62.7%. The total sleep time was 258.5 minutes.  Stage REM latency was 148.5 minutes.  The patient spent 11.22% of the night in stage N1 sleep, 75.44% in stage N2 sleep, 5.42% in stage N3 and 7.9% in REM.  Alpha intrusion was absent.  Supine sleep was 6.00%.  RESPIRATORY PARAMETERS The overall apnea/hypopnea index (AHI) was 2.8 per hour. There were 0 total apneas, including 0 obstructive, 0 central and 0 mixed apneas. There were 12 hypopneas and 1 RERAs.  The AHI during Stage REM sleep was 23.4 per hour.  AHI while supine was 3.9 per hour.  The mean oxygen saturation was 92.77%. The minimum SpO2 during sleep was 85.00%.  moderate snoring was noted during this study.  CARDIAC DATA The 2 lead EKG demonstrated sinus rhythm. The mean heart rate was 60.58 beats per minute. Other EKG findings include: PVCs.  LEG MOVEMENT DATA The total PLMS were 227 with a resulting PLMS index of 52.69. Associated arousal with leg movement index was 1.9.  IMPRESSIONS Severe periodic limb movement are noted. No significant obstructive sleep apnea occurred during this study.   Argie Ramming, MD Diplomate, American Board of Sleep Medicine.  ELECTRONICALLY SIGNED ON:  01/16/2021, 11:44 AM [facility] PH: [phone]   FX: [phone] ACCREDITED BY THE AMERICAN ACADEMY OF SLEEP MEDICINE

## 2021-02-07 DIAGNOSIS — I10 Essential (primary) hypertension: Secondary | ICD-10-CM | POA: Diagnosis not present

## 2021-02-07 DIAGNOSIS — R58 Hemorrhage, not elsewhere classified: Secondary | ICD-10-CM | POA: Diagnosis not present

## 2021-02-07 DIAGNOSIS — I619 Nontraumatic intracerebral hemorrhage, unspecified: Secondary | ICD-10-CM | POA: Diagnosis not present

## 2021-02-07 DIAGNOSIS — I680 Cerebral amyloid angiopathy: Secondary | ICD-10-CM | POA: Diagnosis not present

## 2021-02-07 DIAGNOSIS — R2689 Other abnormalities of gait and mobility: Secondary | ICD-10-CM | POA: Diagnosis not present

## 2021-02-07 DIAGNOSIS — M130 Polyarthritis, unspecified: Secondary | ICD-10-CM | POA: Diagnosis not present

## 2021-02-14 ENCOUNTER — Encounter: Payer: Self-pay | Admitting: Urology

## 2021-02-14 ENCOUNTER — Ambulatory Visit: Payer: PPO | Admitting: Urology

## 2021-02-14 ENCOUNTER — Other Ambulatory Visit: Payer: Self-pay

## 2021-02-14 VITALS — BP 125/78 | HR 74

## 2021-02-14 DIAGNOSIS — N401 Enlarged prostate with lower urinary tract symptoms: Secondary | ICD-10-CM

## 2021-02-14 DIAGNOSIS — R350 Frequency of micturition: Secondary | ICD-10-CM | POA: Diagnosis not present

## 2021-02-14 DIAGNOSIS — R351 Nocturia: Secondary | ICD-10-CM

## 2021-02-14 DIAGNOSIS — N138 Other obstructive and reflux uropathy: Secondary | ICD-10-CM

## 2021-02-14 LAB — BLADDER SCAN AMB NON-IMAGING: PVR: 214 WU

## 2021-02-14 MED ORDER — SILODOSIN 8 MG PO CAPS: 8.0000 mg | ORAL_CAPSULE | Freq: Every day | ORAL | 11 refills | Status: DC

## 2021-02-14 NOTE — Progress Notes (Signed)
Urological Symptom Review  Patient is experiencing the following symptoms: Frequent urination Hard to postpone urination Get up at night to urinate Stream starts and stops Trouble starting stream Have to strain to urinate   Review of Systems  Gastrointestinal (upper)  : Negative for upper GI symptoms  Gastrointestinal (lower) : Negative for lower GI symptoms  Constitutional : Fatigue  Skin: Negative for skin symptoms  Eyes: Negative for eye symptoms  Ear/Nose/Throat : Negative for Ear/Nose/Throat symptoms  Hematologic/Lymphatic: Negative for Hematologic/Lymphatic symptoms  Cardiovascular : Negative for cardiovascular symptoms  Respiratory : Cough  Endocrine: Negative for endocrine symptoms  Musculoskeletal: Joint pain  Neurological: Negative for neurological symptoms  Psychologic: Negative for psychiatric symptoms
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
## 2021-02-14 NOTE — Patient Instructions (Signed)

## 2021-03-30 ENCOUNTER — Other Ambulatory Visit: Payer: PPO | Admitting: Urology

## 2021-04-24 DIAGNOSIS — E782 Mixed hyperlipidemia: Secondary | ICD-10-CM | POA: Diagnosis not present

## 2021-04-24 DIAGNOSIS — E063 Autoimmune thyroiditis: Secondary | ICD-10-CM | POA: Diagnosis not present

## 2021-04-24 DIAGNOSIS — N183 Chronic kidney disease, stage 3 unspecified: Secondary | ICD-10-CM | POA: Diagnosis not present

## 2021-04-24 DIAGNOSIS — I129 Hypertensive chronic kidney disease with stage 1 through stage 4 chronic kidney disease, or unspecified chronic kidney disease: Secondary | ICD-10-CM | POA: Diagnosis not present

## 2021-05-11 ENCOUNTER — Ambulatory Visit: Payer: PPO | Admitting: Urology

## 2021-05-11 ENCOUNTER — Encounter: Payer: Self-pay | Admitting: Urology

## 2021-05-11 ENCOUNTER — Other Ambulatory Visit: Payer: Self-pay

## 2021-05-11 VITALS — BP 130/78 | HR 71

## 2021-05-11 DIAGNOSIS — R351 Nocturia: Secondary | ICD-10-CM

## 2021-05-11 DIAGNOSIS — N138 Other obstructive and reflux uropathy: Secondary | ICD-10-CM

## 2021-05-11 DIAGNOSIS — N401 Enlarged prostate with lower urinary tract symptoms: Secondary | ICD-10-CM

## 2021-05-11 DIAGNOSIS — R350 Frequency of micturition: Secondary | ICD-10-CM | POA: Diagnosis not present

## 2021-05-11 LAB — MICROSCOPIC EXAMINATION
Bacteria, UA: NONE SEEN
Epithelial Cells (non renal): NONE SEEN /hpf (ref 0–10)
RBC, Urine: NONE SEEN /hpf (ref 0–2)
Renal Epithel, UA: NONE SEEN /hpf
WBC, UA: NONE SEEN /hpf (ref 0–5)

## 2021-05-11 LAB — URINALYSIS, ROUTINE W REFLEX MICROSCOPIC
Bilirubin, UA: NEGATIVE
Glucose, UA: NEGATIVE
Ketones, UA: NEGATIVE
Leukocytes,UA: NEGATIVE
Nitrite, UA: NEGATIVE
Protein,UA: NEGATIVE
Specific Gravity, UA: 1.015 (ref 1.005–1.030)
Urobilinogen, Ur: 0.2 mg/dL (ref 0.2–1.0)
pH, UA: 6.5 (ref 5.0–7.5)

## 2021-05-11 MED ORDER — SILODOSIN 8 MG PO CAPS
8.0000 mg | ORAL_CAPSULE | Freq: Every day | ORAL | 3 refills | Status: DC
Start: 2021-05-11 — End: 2022-05-10
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
## 2021-05-11 NOTE — Patient Instructions (Signed)

## 2021-07-05 DIAGNOSIS — Z0000 Encounter for general adult medical examination without abnormal findings: Secondary | ICD-10-CM | POA: Diagnosis not present

## 2021-07-05 DIAGNOSIS — E119 Type 2 diabetes mellitus without complications: Secondary | ICD-10-CM | POA: Diagnosis not present

## 2021-07-05 DIAGNOSIS — Z1331 Encounter for screening for depression: Secondary | ICD-10-CM | POA: Diagnosis not present

## 2021-07-05 DIAGNOSIS — Z6829 Body mass index (BMI) 29.0-29.9, adult: Secondary | ICD-10-CM | POA: Diagnosis not present

## 2021-07-05 DIAGNOSIS — E663 Overweight: Secondary | ICD-10-CM | POA: Diagnosis not present

## 2021-09-11 ENCOUNTER — Other Ambulatory Visit: Payer: Self-pay | Admitting: Physician Assistant

## 2021-10-29 ENCOUNTER — Telehealth: Payer: Self-pay | Admitting: Cardiology

## 2021-10-29 MED ORDER — LOSARTAN POTASSIUM 25 MG PO TABS: ORAL_TABLET | ORAL | 1 refills | Status: DC

## 2021-10-29 NOTE — Telephone Encounter (Signed)
*  STAT* If patient is at the pharmacy, call can be transferred to refill team.   1. Which medications need to be refilled? (please list name of each medication and dose if known) losartan (COZAAR) 25 MG tablet   2. Which pharmacy/location (including street and city if local pharmacy) is medication to be sent to? [id number] - [place], [place] - [address]   3. Do they need a 30 day or 90 day supply? 90 day supply  

## 2021-10-29 NOTE — Telephone Encounter (Signed)
Patient was seen 12/2020 and was supposed to come back in April 2023 but now will not see MD until 03/2022. It is unclear why he never made follow up appointment

## 2021-11-05 ENCOUNTER — Ambulatory Visit: Payer: PPO | Admitting: Orthopedic Surgery

## 2021-11-05 ENCOUNTER — Ambulatory Visit (INDEPENDENT_AMBULATORY_CARE_PROVIDER_SITE_OTHER): Payer: PPO

## 2021-11-05 DIAGNOSIS — Z96642 Presence of left artificial hip joint: Secondary | ICD-10-CM

## 2021-11-05 NOTE — Progress Notes (Signed)
Chief Complaint  Patient presents with   Hip Pain    Left, TKA DOS 09/11/16 hurts some at times    Ryan [name] 5 years postop left total hip has some buttock and lateral hip pain occasionally  His range of motion is excellent with no reproduction of pain he has no tenderness around the hip or back  His x-ray shows stable implant with no loosening  Follow-up in a year for x-ray

## 2021-11-13 ENCOUNTER — Ambulatory Visit: Payer: PPO | Admitting: Internal Medicine

## 2021-12-04 DIAGNOSIS — J069 Acute upper respiratory infection, unspecified: Secondary | ICD-10-CM | POA: Diagnosis not present

## 2021-12-04 DIAGNOSIS — Z6830 Body mass index (BMI) 30.0-30.9, adult: Secondary | ICD-10-CM | POA: Diagnosis not present

## 2021-12-04 DIAGNOSIS — E6609 Other obesity due to excess calories: Secondary | ICD-10-CM | POA: Diagnosis not present

## 2022-02-01 DIAGNOSIS — Z1211 Encounter for screening for malignant neoplasm of colon: Secondary | ICD-10-CM | POA: Diagnosis not present

## 2022-02-11 ENCOUNTER — Encounter (HOSPITAL_COMMUNITY): Payer: Self-pay | Admitting: *Deleted

## 2022-02-11 ENCOUNTER — Emergency Department (HOSPITAL_COMMUNITY)
Admission: EM | Admit: 2022-02-11 | Discharge: 2022-02-11 | Disposition: A | Discharge status: Home / Self Care | Payer: PPO | Attending: Emergency Medicine | Admitting: Emergency Medicine

## 2022-02-11 ENCOUNTER — Other Ambulatory Visit: Payer: Self-pay

## 2022-02-11 DIAGNOSIS — Z79899 Other long term (current) drug therapy: Secondary | ICD-10-CM | POA: Diagnosis not present

## 2022-02-11 DIAGNOSIS — I10 Essential (primary) hypertension: Secondary | ICD-10-CM | POA: Insufficient documentation

## 2022-02-11 LAB — CBC WITH DIFFERENTIAL/PLATELET
Abs Immature Granulocytes: 0.04 10*3/uL (ref 0.00–0.07)
Basophils Absolute: 0.1 10*3/uL (ref 0.0–0.1)
Basophils Relative: 1 %
Eosinophils Absolute: 0.2 10*3/uL (ref 0.0–0.5)
Eosinophils Relative: 3 %
HCT: 43 % (ref 39.0–52.0)
Hemoglobin: 13.9 g/dL (ref 13.0–17.0)
Immature Granulocytes: 1 %
Lymphocytes Relative: 20 %
Lymphs Abs: 1.6 10*3/uL (ref 0.7–4.0)
MCH: 30.7 pg (ref 26.0–34.0)
MCHC: 32.3 g/dL (ref 30.0–36.0)
MCV: 94.9 fL (ref 80.0–100.0)
Monocytes Absolute: 1 10*3/uL (ref 0.1–1.0)
Monocytes Relative: 12 %
Neutro Abs: 5.1 10*3/uL (ref 1.7–7.7)
Neutrophils Relative %: 63 %
Platelets: 222 10*3/uL (ref 150–400)
RBC: 4.53 MIL/uL (ref 4.22–5.81)
RDW: 12.5 % (ref 11.5–15.5)
WBC: 8 10*3/uL (ref 4.0–10.5)
nRBC: 0 % (ref 0.0–0.2)

## 2022-02-11 LAB — BASIC METABOLIC PANEL
Anion gap: 4 — ABNORMAL LOW (ref 5–15)
BUN: 19 mg/dL (ref 8–23)
CO2: 30 mmol/L (ref 22–32)
Calcium: 9 mg/dL (ref 8.9–10.3)
Chloride: 102 mmol/L (ref 98–111)
Creatinine, Ser: 1.32 mg/dL — ABNORMAL HIGH (ref 0.61–1.24)
GFR, Estimated: 56 mL/min — ABNORMAL LOW (ref >60)
Glucose, Bld: 142 mg/dL — ABNORMAL HIGH (ref 70–99)
Potassium: 3.8 mmol/L (ref 3.5–5.1)
Sodium: 136 mmol/L (ref 135–145)

## 2022-02-11 MED ORDER — CLONIDINE HCL 0.1 MG PO TABS: 0.1000 mg | ORAL_TABLET | Freq: Three times a day (TID) | ORAL | 0 refills | Status: DC | PRN

## 2022-02-11 NOTE — ED Triage Notes (Signed)
Pt hypertensive at home since yesterday evening.  163/88 at 1630, 200/110 at 1915 tonight.  Denies any HA's or changes.

## 2022-02-11 NOTE — ED Provider Notes (Signed)
[facility] EMERGENCY DEPARTMENT Provider Note   CSN: [id number] Arrival date & time: 02/11/22  1938     History  Chief Complaint  Patient presents with   Hypertension    Ryan [name] is a 76 y.o. male presenting to ED with asymptomatic hypertension.  Blood pressure was over 200 systolic today.  Typically is around 150 systolic.  He denies headache, blurred vision, chest pain.  He and his wife are concerned because he had a brain bleed in the past with hypertension.  He does not have the same symptoms now  HPI     Home Medications Prior to Admission medications   Medication Sig Start Date End Date Taking? Authorizing Provider  cloNIDine (CATAPRES) 0.1 MG tablet Take 1 tablet (0.1 mg total) by mouth 3 (three) times daily as needed for up to 30 doses. Take if systolic blood pressure is over 200 mmhg, or diastolic pressure is over 100 mmhg 02/11/22  Yes Lavette Yankovich, Kermit Balo, MD  alfuzosin (UROXATRAL) 10 MG 24 hr tablet Take 1 tablet (10 mg total) by mouth at bedtime. 09/19/20   McKenzie, Mardene Celeste, MD  hydrochlorothiazide (MICROZIDE) 12.5 MG capsule Take 12.5 mg by mouth every morning.    [provider]  levothyroxine (SYNTHROID, LEVOTHROID) 25 MCG tablet Take 25 mcg by mouth daily before breakfast. 11/20/16   [provider]  losartan (COZAAR) 25 MG tablet TAKE 1 TABLET BY MOUTH IN THE MORNING AND AT BEDTIME 10/29/21   Jonelle Sidle, MD  melatonin 3 MG TABS tablet Take 1 tablet (3 mg total) by mouth at bedtime as needed (insomnia). 07/17/20   Lorin Glass, MD  rosuvastatin (CRESTOR) 10 MG tablet Take 10 mg by mouth daily. 07/21/20   [provider]  silodosin (RAPAFLO) 8 MG CAPS capsule Take 1 capsule (8 mg total) by mouth at bedtime. 05/11/21   McKenzie, Mardene Celeste, MD      Allergies    Aspirin, Codeine, and Morphine    Review of Systems   Review of Systems  Physical Exam Updated Vital Signs BP (!) 165/88   Pulse 80   Temp 98.1 F (36.7 C)  (Temporal)   Resp 19   Ht 5\' 8"  (1.727 m)   Wt 93 kg   SpO2 96%   BMI 31.17 kg/m  Physical Exam Constitutional:      General: He is not in acute distress. HENT:     Head: Normocephalic and atraumatic.  Eyes:     Conjunctiva/sclera: Conjunctivae normal.     Pupils: Pupils are equal, round, and reactive to light.  Cardiovascular:     Rate and Rhythm: Normal rate and regular rhythm.  Pulmonary:     Effort: Pulmonary effort is normal. No respiratory distress.  Abdominal:     General: There is no distension.     Tenderness: There is no abdominal tenderness.  Skin:    General: Skin is warm and dry.  Neurological:     General: No focal deficit present.     Mental Status: He is alert. Mental status is at baseline.  Psychiatric:        Mood and Affect: Mood normal.        Behavior: Behavior normal.     ED Results / Procedures / Treatments   Labs (all labs ordered are listed, but only abnormal results are displayed) Labs Reviewed  BASIC METABOLIC PANEL - Abnormal; Notable for the following components:      Result Value   Glucose, Bld  142 (*)    Creatinine, Ser 1.32 (*)    GFR, Estimated 56 (*)    Anion gap 4 (*)    All other components within normal limits  CBC WITH DIFFERENTIAL/PLATELET    EKG None  Radiology No results found.  Procedures Procedures    Medications Ordered in ED Medications - No data to display  ED Course/ Medical Decision Making/ A&P                           Medical Decision Making Amount and/or Complexity of Data Reviewed Labs: ordered.   Patient is here with asymptomatic hypertension.  Very low suspicion for ICH, ACS.  Do not see an indication for CT imaging of the head or further work-up at this time.  He was little hypertensive on arrival blood pressure is trickling down.  He did take his evening medications.  I will provide him clonidine to take as needed at home with his pressure to spike again.  We discussed dietary changes, reducing  salt.  Follow-up with his doctor.  His wife is present to provide supplemental history and verbalized understanding.  External records reviewed including prior history of hospitalization for ICH and hypertension.        Final Clinical Impression(s) / ED Diagnoses Final diagnoses:  Asymptomatic hypertension    Rx / DC Orders ED Discharge Orders          Ordered    cloNIDine (CATAPRES) 0.1 MG tablet  3 times daily PRN        02/11/22 2157              Terald Sleeper, MD 02/11/22 2159

## 2022-02-11 NOTE — Discharge Instructions (Addendum)
I prescribed a medicine called clonidine which you will take "as needed".  You should continue your other normal blood pressure medications at home.  If your blood pressure is high, you should do is recheck the blood pressure on the other arm, after waiting quietly in the room for 5 minutes.  If the blood pressure remains high, you can take a clonidine tablet.    You should take clonidine only if your systolic blood pressure, or the top number, is over 200 mmhg -- or if the diastolic blood pressure, or the bottom number, is over 100 mm hg -- on repeat checks.

## 2022-02-11 NOTE — ED Notes (Signed)
Went over [employer]. Verbalized understanding. Ambulatory to lobby with wife.

## 2022-02-13 DIAGNOSIS — I10 Essential (primary) hypertension: Secondary | ICD-10-CM | POA: Diagnosis not present

## 2022-02-13 DIAGNOSIS — Z6830 Body mass index (BMI) 30.0-30.9, adult: Secondary | ICD-10-CM | POA: Diagnosis not present

## 2022-02-13 DIAGNOSIS — E6609 Other obesity due to excess calories: Secondary | ICD-10-CM | POA: Diagnosis not present

## 2022-02-13 DIAGNOSIS — Z23 Encounter for immunization: Secondary | ICD-10-CM | POA: Diagnosis not present

## 2022-04-03 ENCOUNTER — Encounter: Payer: Self-pay | Admitting: Cardiology

## 2022-04-03 ENCOUNTER — Ambulatory Visit: Payer: PPO | Attending: Cardiology | Admitting: Cardiology

## 2022-04-03 VITALS — BP 138/86 | HR 65 | Ht 68.5 in | Wt 208.0 lb

## 2022-04-03 DIAGNOSIS — I5022 Chronic systolic (congestive) heart failure: Secondary | ICD-10-CM | POA: Diagnosis not present

## 2022-04-03 DIAGNOSIS — I10 Essential (primary) hypertension: Secondary | ICD-10-CM
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
## 2022-04-03 NOTE — Patient Instructions (Signed)
Medication Instructions:  Your physician recommends that you continue on your current medications as directed. Please refer to the Current Medication list given to you today.   Labwork: None  Testing/Procedures: None  Follow-Up: Follow up with Dr. McDowell in 1 year.   Any Other Special Instructions Will Be Listed Below (If Applicable).     If you need a refill on your cardiac medications before your next appointment, please call your pharmacy.  

## 2022-05-10 ENCOUNTER — Ambulatory Visit (INDEPENDENT_AMBULATORY_CARE_PROVIDER_SITE_OTHER): Payer: PPO | Admitting: Urology

## 2022-05-10 ENCOUNTER — Encounter: Payer: Self-pay | Admitting: Urology

## 2022-05-10 VITALS — BP 157/84 | HR 69

## 2022-05-10 DIAGNOSIS — N401 Enlarged prostate with lower urinary tract symptoms: Secondary | ICD-10-CM

## 2022-05-10 DIAGNOSIS — N138 Other obstructive and reflux uropathy: Secondary | ICD-10-CM

## 2022-05-10 DIAGNOSIS — R350 Frequency of micturition: Secondary | ICD-10-CM

## 2022-05-10 DIAGNOSIS — R351 Nocturia: Secondary | ICD-10-CM

## 2022-05-10 LAB — MICROSCOPIC EXAMINATION: Bacteria, UA: NONE SEEN

## 2022-05-10 LAB — URINALYSIS, ROUTINE W REFLEX MICROSCOPIC
Bilirubin, UA: NEGATIVE
Glucose, UA: NEGATIVE
Ketones, UA: NEGATIVE
Leukocytes,UA: NEGATIVE
Nitrite, UA: NEGATIVE
Protein,UA: NEGATIVE
Specific Gravity, UA: 1.01 (ref 1.005–1.030)
Urobilinogen, Ur: 0.2 mg/dL (ref 0.2–1.0)
pH, UA: 6 (ref 5.0–7.5)

## 2022-05-10 LAB — BLADDER SCAN AMB NON-IMAGING: Scan Result: 264

## 2022-05-10 MED ORDER — SILODOSIN 8 MG PO CAPS: 8.0000 mg | ORAL_CAPSULE | Freq: Every day | ORAL | 3 refills | Status: DC

## 2022-05-10 NOTE — Patient Instructions (Signed)
Benign Prostatic Hyperplasia ? ?Benign prostatic hyperplasia (BPH) is an enlarged prostate gland that is caused by the normal aging process. The prostate may get bigger as a man gets older. The condition is not caused by cancer. The prostate is a walnut-sized gland that is involved in the production of semen. It is located in front of the rectum and below the bladder. The bladder stores urine. The urethra carries stored urine out of the body. ?An enlarged prostate can press on the urethra. This can make it harder to pass urine. The buildup of urine in the bladder can cause infection. Back pressure and infection may progress to bladder damage and kidney (renal) failure. ?What are the causes? ?This condition is part of the normal aging process. However, not all men develop problems from this condition. If the prostate enlarges away from the urethra, urine flow will not be blocked. If it enlarges toward the urethra and compresses it, there will be problems passing urine. ?What increases the risk? ?This condition is more likely to develop in men older than 50 years. ?What are the signs or symptoms? ?Symptoms of this condition include: ?Getting up often during the night to urinate. ?Needing to urinate frequently during the day. ?Difficulty starting urine flow. ?Decrease in size and strength of your urine stream. ?Leaking (dribbling) after urinating. ?Inability to pass urine. This needs immediate treatment. ?Inability to completely empty your bladder. ?Pain when you pass urine. This is more common if there is also an infection. ?Urinary tract infection (UTI). ?How is this diagnosed? ?This condition is diagnosed based on your medical history, a physical exam, and your symptoms. Tests will also be done, such as: ?A post-void bladder scan. This measures any amount of urine that may remain in your bladder after you finish urinating. ?A digital rectal exam. In a rectal exam, your health care provider checks your prostate by  putting a lubricated, gloved finger into your rectum to feel the back of your prostate gland. This exam detects the size of your gland and any abnormal lumps or growths. ?An exam of your urine (urinalysis). ?A prostate specific antigen (PSA) screening. This is a blood test used to screen for prostate cancer. ?An ultrasound. This test uses sound waves to electronically produce a picture of your prostate gland. ?Your health care provider may refer you to a specialist in kidney and prostate diseases (urologist). ?How is this treated? ?Once symptoms begin, your health care provider will monitor your condition (active surveillance or watchful waiting). Treatment for this condition will depend on the severity of your condition. Treatment may include: ?Observation and yearly exams. This may be the only treatment needed if your condition and symptoms are mild. ?Medicines to relieve your symptoms, including: ?Medicines to shrink the prostate. ?Medicines to relax the muscle of the prostate. ?Surgery in severe cases. Surgery may include: ?Prostatectomy. In this procedure, the prostate tissue is removed completely through an open incision or with a laparoscope or robotics. ?Transurethral resection of the prostate (TURP). In this procedure, a tool is inserted through the opening at the tip of the penis (urethra). It is used to cut away tissue of the inner core of the prostate. The pieces are removed through the same opening of the penis. This removes the blockage. ?Transurethral incision (TUIP). In this procedure, small cuts are made in the prostate. This lessens the prostate's pressure on the urethra. ?Transurethral microwave thermotherapy (TUMT). This procedure uses microwaves to create heat. The heat destroys and removes a small amount of   prostate tissue. ?Transurethral needle ablation (TUNA). This procedure uses radio frequencies to destroy and remove a small amount of prostate tissue. ?Interstitial laser coagulation (ILC).  This procedure uses a laser to destroy and remove a small amount of prostate tissue. ?Transurethral electrovaporization (TUVP). This procedure uses electrodes to destroy and remove a small amount of prostate tissue. ?Prostatic urethral lift. This procedure inserts an implant to push the lobes of the prostate away from the urethra. ?Follow these instructions at home: ?Take over-the-counter and prescription medicines only as told by your health care provider. ?Monitor your symptoms for any changes. Contact your health care provider with any changes. ?Avoid drinking large amounts of liquid before going to bed or out in public. ?Avoid or reduce how much caffeine or alcohol you drink. ?Give yourself time when you urinate. ?Keep all follow-up visits. This is important. ?Contact a health care provider if: ?You have unexplained back pain. ?Your symptoms do not get better with treatment. ?You develop side effects from the medicine you are taking. ?Your urine becomes very dark or has a bad smell. ?Your lower abdomen becomes distended and you have trouble passing urine. ?Get help right away if: ?You have a fever or chills. ?You suddenly cannot urinate. ?You feel light-headed or very dizzy, or you faint. ?There are large amounts of blood or clots in your urine. ?Your urinary problems become hard to manage. ?You develop moderate to severe low back or flank pain. The flank is the side of your body between the ribs and the hip. ?These symptoms may be an emergency. Get help right away. Call 911. ?Do not wait to see if the symptoms will go away. ?Do not drive yourself to the hospital. ?Summary ?Benign prostatic hyperplasia (BPH) is an enlarged prostate that is caused by the normal aging process. It is not caused by cancer. ?An enlarged prostate can press on the urethra. This can make it hard to pass urine. ?This condition is more likely to develop in men older than 50 years. ?Get help right away if you suddenly cannot urinate. ?This  information is not intended to replace advice given to you by your health care provider. Make sure you discuss any questions you have with your health care provider. ?Document Revised: 09/27/2020 Document Reviewed: 09/27/2020 ?Elsevier Patient Education ? 2023 Elsevier Inc[employer] ? ?

## 2022-05-10 NOTE — Progress Notes (Signed)
post void residual=264
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
## 2022-06-23 DIAGNOSIS — I129 Hypertensive chronic kidney disease with stage 1 through stage 4 chronic kidney disease, or unspecified chronic kidney disease: Secondary | ICD-10-CM | POA: Diagnosis not present

## 2022-06-23 DIAGNOSIS — R7303 Prediabetes: Secondary | ICD-10-CM | POA: Diagnosis not present

## 2022-06-23 DIAGNOSIS — N183 Chronic kidney disease, stage 3 unspecified: Secondary | ICD-10-CM | POA: Diagnosis not present

## 2022-06-23 DIAGNOSIS — E063 Autoimmune thyroiditis: Secondary | ICD-10-CM | POA: Diagnosis not present

## 2022-06-26 IMAGING — CT CT ANGIO HEAD
2 of 10 series · 7 of 35 positions shown · IV contrast (Omnipaque or Isovue)
Comparison: None.

CLINICAL DATA: Hypertension. Headache. Possible subcentimeter right
occipital hemorrhage on earlier noncontrast head CT.

EXAM:
CT ANGIOGRAPHY HEAD AND NECK
TECHNIQUE: Multidetector CT imaging of the head and neck was performed using
the standard protocol during bolus administration of intravenous
contrast. Multiplanar CT image reconstructions and MIPs were
obtained to evaluate the vascular anatomy. Carotid stenosis
measurements (when applicable) are obtained utilizing NASCET
criteria, using the distal internal carotid diameter as the
denominator.
CONTRAST:  75mL OMNIPAQUE IOHEXOL 350 MG/ML SOLN

[Series 7: ax thins · axial · 0.39mm/px · z∈[+878,+1131]mm · 6 of 355 slices shown]
[im 51/355  soft-tissue]
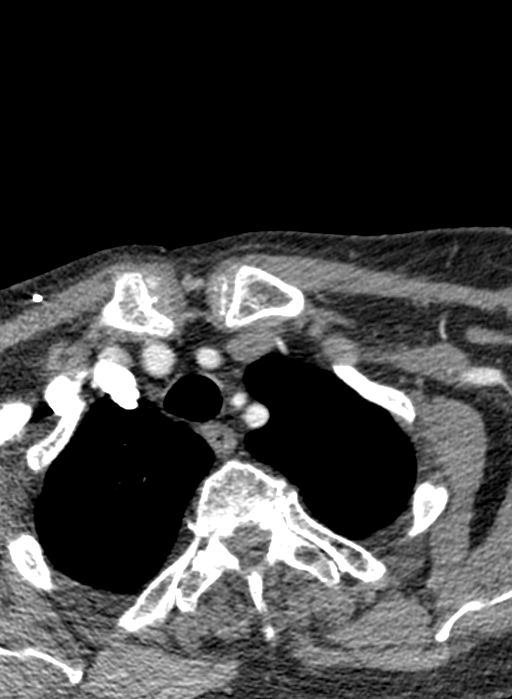
[im 102/355  bone]
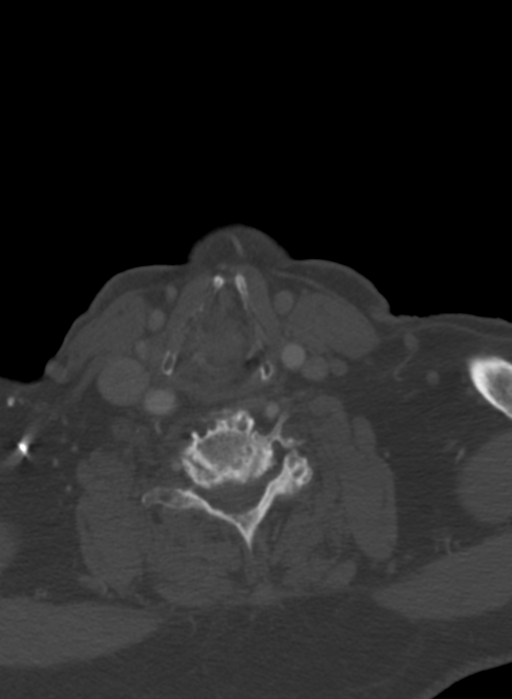
[im 152/355  soft-tissue]
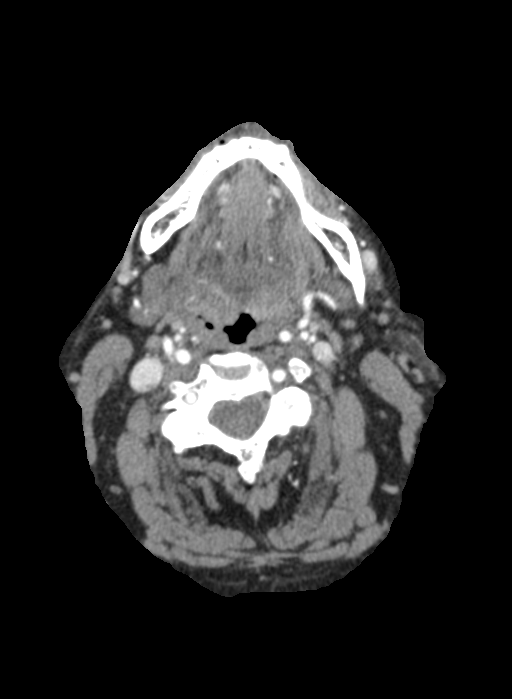
[im 203/355  bone]
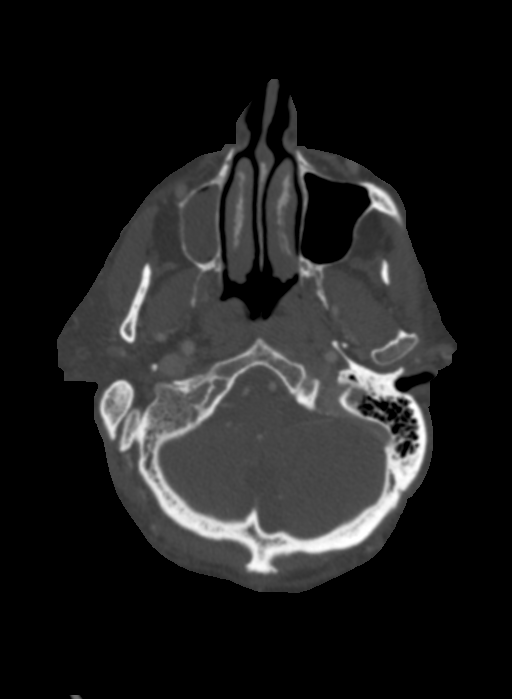
[im 253/355  soft-tissue]
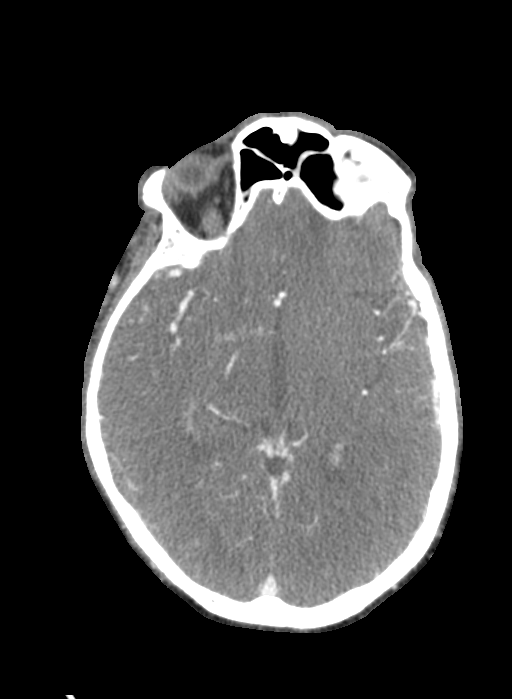
[im 304/355  bone]
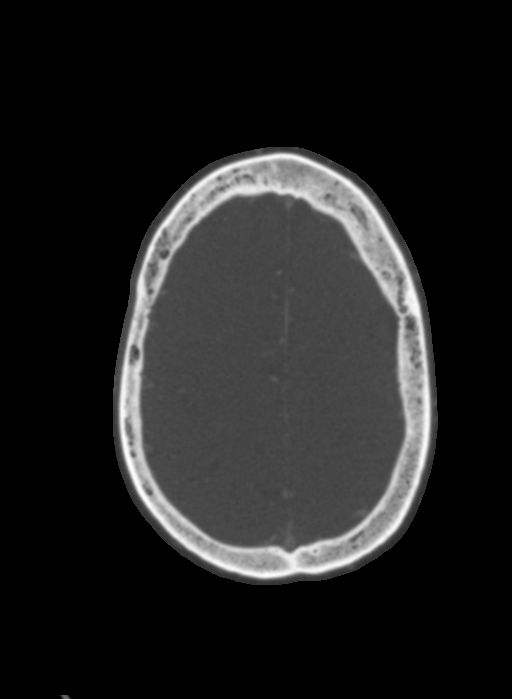

[Series 12: sag thick · sagittal · 0.51mm/px · 1 of 44 slices shown]
[im 25/44  soft-tissue]
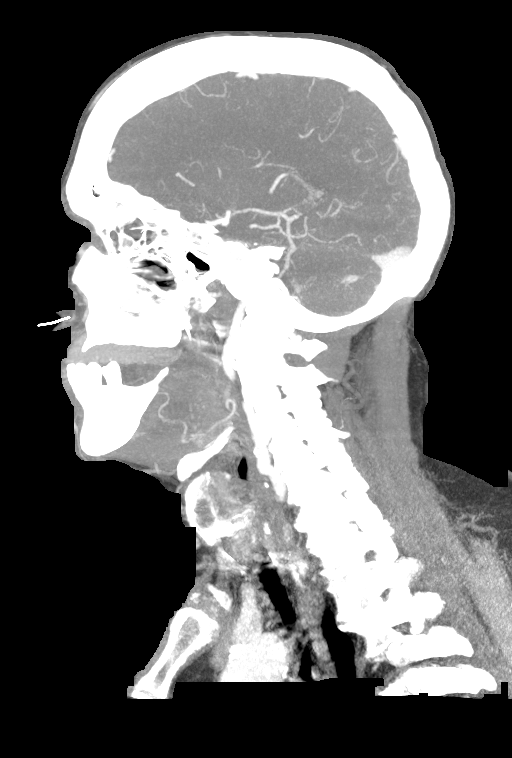

[7 of 35 positions shown; findings below may reference images not displayed]

FINDINGS: CTA NECK FINDINGS

Aortic arch: Normal variant 4 vessel aortic arch with the left
vertebral artery arising directly from the arch. Mild aortic
atherosclerosis without arch vessel origin stenosis.

Right carotid system: Patent with a small to moderate amount of
predominantly soft plaque at the carotid bifurcation. No evidence of
significant stenosis or dissection.

Left carotid system: Patent with a small amount of soft and
calcified plaque at the carotid bifurcation resulting in mild
stenosis of the ECA origin but no significant common or internal
carotid artery stenosis.

Vertebral arteries: The vertebral arteries are patent with the left
being mildly dominant. No significant stenosis or dissection is
identified although assessment of the left V1 segment is limited by
motion.

Skeleton: Advanced disc degeneration in the lower cervical spine.
Advanced facet arthrosis throughout the cervical spine with
multilevel degenerative anterolisthesis.

Other neck: No evidence of cervical lymphadenopathy or mass.

Upper chest: No apical lung consolidation or mass.

Review of the MIP images confirms the above findings

CTA HEAD FINDINGS

Anterior circulation: The internal carotid arteries are patent from
skull base to carotid termini with atherosclerotic plaque resulting
in mild cavernous and mild to moderate paraclinoid stenosis on the
right and minimal to mild stenosis in the segments on the left. ACAs
and MCAs are patent without evidence of a proximal branch occlusion
or significant proximal stenosis. No aneurysm is identified.

Posterior circulation: The intracranial vertebral arteries are
widely patent to the basilar. The right PICA and left AICA appear
dominant. Patent SCAs are seen bilaterally. The basilar artery is
widely patent. Posterior communicating arteries are diminutive or
absent. Both PCAs are patent without evidence of a significant
proximal stenosis. No aneurysm is identified.

Venous sinuses: Patent.

Anatomic variants: None.

Delayed phase: The 3 mm hyperdense focus located near the gray-white
junction in the right occipital lobe is unchanged from the earlier
noncontrast CT. No abnormal intracranial enhancement is identified.

Review of the MIP images confirms the above findings
IMPRESSION: 1. No large vessel occlusion.
2. Intracranial atherosclerosis resulting in mild-to-moderate right
ICA stenoses.
3. Cervical carotid atherosclerosis without significant stenosis.
4. No significant posterior circulation stenosis.
5. Aortic Atherosclerosis ([id number]-KS0.0).

## 2022-07-03 DIAGNOSIS — I10 Essential (primary) hypertension: Secondary | ICD-10-CM | POA: Diagnosis not present

## 2022-07-03 DIAGNOSIS — E663 Overweight: Secondary | ICD-10-CM | POA: Diagnosis not present

## 2022-07-03 DIAGNOSIS — Z6829 Body mass index (BMI) 29.0-29.9, adult: Secondary | ICD-10-CM | POA: Diagnosis not present

## 2022-07-23 DIAGNOSIS — I129 Hypertensive chronic kidney disease with stage 1 through stage 4 chronic kidney disease, or unspecified chronic kidney disease: Secondary | ICD-10-CM | POA: Diagnosis not present

## 2022-07-23 DIAGNOSIS — N183 Chronic kidney disease, stage 3 unspecified: Secondary | ICD-10-CM | POA: Diagnosis not present

## 2022-07-23 DIAGNOSIS — R7303 Prediabetes: Secondary | ICD-10-CM | POA: Diagnosis not present

## 2022-07-23 DIAGNOSIS — E063 Autoimmune thyroiditis: Secondary | ICD-10-CM | POA: Diagnosis not present

## 2022-07-25 DIAGNOSIS — N183 Chronic kidney disease, stage 3 unspecified: Secondary | ICD-10-CM | POA: Diagnosis not present

## 2022-07-25 DIAGNOSIS — E6609 Other obesity due to excess calories: Secondary | ICD-10-CM | POA: Diagnosis not present

## 2022-07-25 DIAGNOSIS — I129 Hypertensive chronic kidney disease with stage 1 through stage 4 chronic kidney disease, or unspecified chronic kidney disease: Secondary | ICD-10-CM | POA: Diagnosis not present

## 2022-07-25 DIAGNOSIS — Z0001 Encounter for general adult medical examination with abnormal findings: Secondary | ICD-10-CM | POA: Diagnosis not present

## 2022-07-25 DIAGNOSIS — E782 Mixed hyperlipidemia: Secondary | ICD-10-CM | POA: Diagnosis not present

## 2022-07-25 DIAGNOSIS — E063 Autoimmune thyroiditis: Secondary | ICD-10-CM | POA: Diagnosis not present

## 2022-07-25 DIAGNOSIS — Z6830 Body mass index (BMI) 30.0-30.9, adult: Secondary | ICD-10-CM | POA: Diagnosis not present

## 2022-07-25 DIAGNOSIS — Z96659 Presence of unspecified artificial knee joint: Secondary | ICD-10-CM | POA: Diagnosis not present

## 2022-07-25 DIAGNOSIS — Z1211 Encounter for screening for malignant neoplasm of colon: Secondary | ICD-10-CM | POA: Diagnosis not present

## 2022-07-25 DIAGNOSIS — R7303 Prediabetes: Secondary | ICD-10-CM | POA: Diagnosis not present

## 2022-07-25 DIAGNOSIS — N401 Enlarged prostate with lower urinary tract symptoms: Secondary | ICD-10-CM | POA: Diagnosis not present

## 2022-07-25 DIAGNOSIS — Z1331 Encounter for screening for depression: Secondary | ICD-10-CM | POA: Diagnosis not present

## 2022-07-30 ENCOUNTER — Telehealth: Payer: Self-pay | Admitting: Orthopedic Surgery

## 2022-07-30 NOTE — Telephone Encounter (Signed)
Dr. Mort Sawyers pt - Ryan [name] w/[facility] [phone] lvm stating the patient has had a joint replacement and they need to know what Dr. Mort Sawyers recommendation for antibiotic pre med is for a cleaning.

## 2022-08-26 DIAGNOSIS — L570 Actinic keratosis: Secondary | ICD-10-CM | POA: Diagnosis not present

## 2022-08-26 DIAGNOSIS — X32XXXA Exposure to sunlight, initial encounter: Secondary | ICD-10-CM | POA: Diagnosis not present

## 2022-09-09 ENCOUNTER — Other Ambulatory Visit (INDEPENDENT_AMBULATORY_CARE_PROVIDER_SITE_OTHER): Payer: PPO

## 2022-09-09 ENCOUNTER — Ambulatory Visit: Payer: PPO | Admitting: Orthopedic Surgery

## 2022-09-09 ENCOUNTER — Encounter: Payer: Self-pay | Admitting: Orthopedic Surgery

## 2022-09-09 ENCOUNTER — Telehealth: Payer: Self-pay

## 2022-09-09 VITALS — BP 142/75 | HR 68 | Ht 68.0 in | Wt 202.0 lb

## 2022-09-09 DIAGNOSIS — M545 Low back pain, unspecified: Secondary | ICD-10-CM

## 2022-09-09 DIAGNOSIS — M9905 Segmental and somatic dysfunction of pelvic region: Secondary | ICD-10-CM

## 2022-09-09 DIAGNOSIS — M5410 Radiculopathy, site unspecified: Secondary | ICD-10-CM

## 2022-09-09 MED ORDER — PREDNISONE 10 MG (48) PO TBPK: ORAL_TABLET | Freq: Every day | ORAL | 0 refills | Status: DC

## 2022-09-09 MED ORDER — HYDROCODONE-ACETAMINOPHEN 5-325 MG PO TABS: 1.0000 | ORAL_TABLET | Freq: Four times a day (QID) | ORAL | 0 refills | Status: DC | PRN

## 2022-09-09 NOTE — Telephone Encounter (Addendum)
I called him told him to expect a call for a workin appointment  Until then rest and ice.

## 2022-09-09 NOTE — Telephone Encounter (Signed)
Patient's wife called stating that patient fell flat on his back last Wednesday and he had a total hip done In 2018 by Dr. Romeo Apple. It took a few days before he started having pain, but was wondering if he needs to be checked out. She also wanted to know if there was anything he needs to do so that he doesn't make things worse until he is seen if needed. Asking for a return call at [phone].  Please advise

## 2022-09-09 NOTE — Progress Notes (Unsigned)
Chief Complaint  Patient presents with   Hip Pain    Left hip pain fell chasing lawnmower on 08/28/22 taking tylenol and severe pain  when stands left leg gets weak      77 year old male plan to pick up a lawnmower that was stuck June 5 and then 3 days ago started having severe pain in his left side get back and giving way of his left leg  He is status post bilateral total knees of the left total hip  Tylenol has not relieved his pain.  Red flags none Radiation to the foot no Pain radiates to buttocks and hip yes Associated signs and symptoms weakness when standing for long periods of time left leg: Only  Physical Exam Vitals and nursing note reviewed.  Constitutional:      Appearance: Normal appearance.  HENT:     Head: Normocephalic and atraumatic.  Eyes:     General: No scleral icterus.       Right eye: No discharge.        Left eye: No discharge.     Extraocular Movements: Extraocular movements intact.     Conjunctiva/sclera: Conjunctivae normal.     Pupils: Pupils are equal, round, and reactive to light.  Cardiovascular:     Rate and Rhythm: Normal rate.     Pulses: Normal pulses.  Skin:    General: Skin is warm and dry.     Capillary Refill: Capillary refill takes less than 2 seconds.  Neurological:     General: No focal deficit present.     Mental Status: He is alert and oriented to person, place, and time.  Psychiatric:        Mood and Affect: Mood normal.        Behavior: Behavior normal.        Thought Content: Thought content normal.        Judgment: Judgment normal.    Lumbar Tenderness left side L5-S1 radiates to left hip greater trochanter and upper left thigh proximal and mid thigh level  Straight leg raise is weak at the hip range of motion of the hip is normal straight leg raise testing is normal  Imaging severe scoliosis and spondylosis of the spine old T12 compression deformity no acute fracture  Left hip implant normal no pelvic  fracture  Encounter Diagnoses  Name Primary?   Left lumbar pain    Segmental and somatic dysfunction of pelvic region    Radiculitis of leg Yes    Meds ordered this encounter  Medications   predniSONE (STERAPRED UNI-PAK 48 TAB) 10 MG (48) TBPK tablet    Sig: Take by mouth daily. 10 mg ds 12 days    Dispense:  48 tablet    Refill:  0   HYDROcodone-acetaminophen (NORCO/VICODIN) 5-325 MG tablet    Sig: Take 1 tablet by mouth every 6 (six) hours as needed for moderate pain.    Dispense:  30 tablet    Refill:  0    Advise rest and heating pad Take the medications as above  Return in 2 to 3 weeks

## 2022-09-23 ENCOUNTER — Ambulatory Visit: Payer: PPO | Admitting: Orthopedic Surgery

## 2022-09-23 VITALS — BP 107/71 | HR 76

## 2022-09-23 DIAGNOSIS — M5410 Radiculopathy, site unspecified: Secondary | ICD-10-CM | POA: Diagnosis not present

## 2022-09-23 NOTE — Progress Notes (Signed)
Chief Complaint  Patient presents with   Back Pain    Hip and back doing better   Encounter Diagnosis  Name Primary?   Radiculitis of leg Yes    77 year old male with acute radiculitis of his leg with back pain  After dose of prednisone he has significantly improved is not taking any medication at this time  He has no tenderness in his back on exam he can flex and extend without discomfort  He is advised to resume normal activities in a progressive gradual fashion  He will follow-up with [place] as needed

## 2022-10-07 ENCOUNTER — Encounter: Payer: Self-pay | Admitting: Orthopedic Surgery

## 2022-10-07 ENCOUNTER — Ambulatory Visit: Payer: PPO | Admitting: Orthopedic Surgery

## 2022-10-07 VITALS — BP 143/87 | HR 87 | Ht 68.0 in | Wt 202.0 lb

## 2022-10-07 DIAGNOSIS — M5442 Lumbago with sciatica, left side: Secondary | ICD-10-CM | POA: Diagnosis not present

## 2022-10-07 DIAGNOSIS — M5416 Radiculopathy, lumbar region: Secondary | ICD-10-CM | POA: Diagnosis not present

## 2022-10-07 DIAGNOSIS — G8929 Other chronic pain: Secondary | ICD-10-CM | POA: Diagnosis not present

## 2022-10-07 MED ORDER — PREDNISONE 10 MG (48) PO TBPK: ORAL_TABLET | Freq: Every day | ORAL | 0 refills | Status: DC

## 2022-10-07 MED ORDER — HYDROCODONE-ACETAMINOPHEN 5-325 MG PO TABS: 1.0000 | ORAL_TABLET | Freq: Four times a day (QID) | ORAL | 0 refills | Status: DC | PRN

## 2022-10-07 MED ORDER — GABAPENTIN 100 MG PO CAPS: 100.0000 mg | ORAL_CAPSULE | Freq: Three times a day (TID) | ORAL | 2 refills | Status: AC

## 2022-10-07 NOTE — Progress Notes (Signed)
Chief Complaint  Patient presents with   Follow-up    Back pain   Steroid has not had another bout of radiculitis in his left leg.  He says he was good for about 2 to 3 days since his last visit on July 1 but then started having acute pain in his left leg with radiation down to his ankle and trouble bearing weight on his left leg  He responded well last time to steroids and opioids for short period of time  Review of Systems  Constitutional:  Negative for fever.  Respiratory:  Negative for shortness of breath.   Cardiovascular:  Negative for chest pain.  Gastrointestinal: Negative.   Genitourinary: Negative.   Musculoskeletal:  Positive for back pain.  Skin: Negative.   Neurological:  Positive for weakness.     Since his symptoms are recurrent   Recommend MRI lumbar spine   I will call him with the results and refer him to appropriate orthopedist who deals with spine issues  Meds ordered this encounter  Medications   predniSONE (STERAPRED UNI-PAK 48 TAB) 10 MG (48) TBPK tablet    Sig: Take by mouth daily. 10 mg ds 12 days    Dispense:  48 tablet    Refill:  0   HYDROcodone-acetaminophen (NORCO/VICODIN) 5-325 MG tablet    Sig: Take 1 tablet by mouth every 6 (six) hours as needed for moderate pain.    Dispense:  30 tablet    Refill:  0   gabapentin (NEURONTIN) 100 MG capsule    Sig: Take 1 capsule (100 mg total) by mouth 3 (three) times daily.    Dispense:  90 capsule    Refill:  2

## 2022-10-07 NOTE — Patient Instructions (Addendum)
After the MRI I will call you when I get the results and then I will refer you to the [facility] back specialist   While we are working on your approval for MRI please go ahead and call to schedule your appointment with [facility] within at least one (1) week.   Central Scheduling [phone]

## 2022-10-10 ENCOUNTER — Ambulatory Visit (HOSPITAL_COMMUNITY): Payer: PPO

## 2022-10-11 ENCOUNTER — Ambulatory Visit (HOSPITAL_COMMUNITY)
Admission: RE | Admit: 2022-10-11 | Discharge: 2022-10-11 | Disposition: A | Discharge status: Home / Self Care | Payer: PPO | Source: Ambulatory Visit | Attending: Orthopedic Surgery | Admitting: Orthopedic Surgery

## 2022-10-11 DIAGNOSIS — G8929 Other chronic pain: Secondary | ICD-10-CM | POA: Diagnosis not present

## 2022-10-11 DIAGNOSIS — K8020 Calculus of gallbladder without cholecystitis without obstruction: Secondary | ICD-10-CM | POA: Diagnosis not present

## 2022-10-11 DIAGNOSIS — M5416 Radiculopathy, lumbar region: Secondary | ICD-10-CM | POA: Diagnosis not present

## 2022-10-11 DIAGNOSIS — M5442 Lumbago with sciatica, left side: Secondary | ICD-10-CM | POA: Diagnosis not present

## 2022-10-11 DIAGNOSIS — M48061 Spinal stenosis, lumbar region without neurogenic claudication: Secondary | ICD-10-CM | POA: Diagnosis not present

## 2022-10-11 DIAGNOSIS — M4317 Spondylolisthesis, lumbosacral region: Secondary | ICD-10-CM | POA: Diagnosis not present

## 2022-10-11 DIAGNOSIS — M47816 Spondylosis without myelopathy or radiculopathy, lumbar region: Secondary | ICD-10-CM | POA: Diagnosis not present

## 2022-10-15 ENCOUNTER — Telehealth: Payer: Self-pay | Admitting: Radiology

## 2022-10-15 ENCOUNTER — Telehealth: Payer: Self-pay | Admitting: Orthopedic Surgery

## 2022-10-15 DIAGNOSIS — G8929 Other chronic pain: Secondary | ICD-10-CM

## 2022-10-15 DIAGNOSIS — M5416 Radiculopathy, lumbar region: Secondary | ICD-10-CM

## 2022-10-15 DIAGNOSIS — M545 Low back pain, unspecified: Secondary | ICD-10-CM

## 2022-10-15 NOTE — Telephone Encounter (Signed)
  IMPRESSION: 1. Multilevel degenerative changes throughout the lumbar spine as above, overall progressed since 2016. 2. Degenerative changes are most advanced at L4-L5 where there is severe spinal canal stenosis with compression of the cauda equina nerve roots, and moderate left and mild right neural foraminal stenosis. 3. Mild-to-moderate spinal canal stenosis with right worse than left subarticular zone narrowing at L3-L4 which may affect the traversing right L4 nerve root, and moderate right neural foraminal stenosis. 4. Grade 1 anterolisthesis of L5 on S1 with associated advanced facet arthropathy. 5. Mild S shaped scoliosis. 6. Cholelithiasis. 7. 3.4 cm infrarenal abdominal aortic aneurysm. Recommend imaging follow-up every 3 years.

## 2022-10-15 NOTE — Telephone Encounter (Signed)
-----   Message from [place] sent at 10/15/2022 10:34 AM EDT ----- Regarding: FW: Can you get him to see DR Christell Constant ----- Message ----- From: Leory Plowman, Rad Results In Sent: 10/15/2022   8:35 AM EDT To: Vickki Hearing, MD

## 2022-10-15 NOTE — Telephone Encounter (Signed)
Cassandra couldn't find the referral so I put in again

## 2022-10-15 NOTE — Telephone Encounter (Signed)
Dr. Mort Sawyers pt - pt's spouse lvm stating that Jaccob needs help, stated that he's losing usage of his left leg and it's going numb.  She also asked about his MRI.

## 2022-10-23 ENCOUNTER — Other Ambulatory Visit (INDEPENDENT_AMBULATORY_CARE_PROVIDER_SITE_OTHER): Payer: PPO

## 2022-10-23 ENCOUNTER — Ambulatory Visit: Payer: PPO | Admitting: Orthopedic Surgery

## 2022-10-23 ENCOUNTER — Encounter: Payer: Self-pay | Admitting: Orthopedic Surgery

## 2022-10-23 VITALS — BP 135/90 | HR 83 | Ht 68.0 in | Wt 202.0 lb

## 2022-10-23 DIAGNOSIS — M5416 Radiculopathy, lumbar region: Secondary | ICD-10-CM

## 2022-10-23 DIAGNOSIS — M5442 Lumbago with sciatica, left side: Secondary | ICD-10-CM | POA: Diagnosis not present

## 2022-10-23 DIAGNOSIS — G8929 Other chronic pain: Secondary | ICD-10-CM

## 2022-10-23 MED ORDER — HYDROCODONE-ACETAMINOPHEN 5-325 MG PO TABS: 1.0000 | ORAL_TABLET | ORAL | 0 refills | Status: AC | PRN

## 2022-10-23 NOTE — Progress Notes (Addendum)
Orthopedic Spine Surgery Office Note  Assessment: Patient is a 77 y.o. male with low back pain that radiates into bilateral buttock and the left leg.  Pain improves with sitting or flexed lumbar posture.  Symptoms consistent with neurogenic claudication.  Has central, lateral recess, and foraminal stenosis at L4/5.    Plan: -Patient has tried gabapentin, hydrocodone, prednisone -Given that he has been doing this for 2 months and his pain has gotten progressively worse along with the development of progressive neurologic deficits, recommended operative intervention.  After covering the proposed surgery, patient elected to proceed -Has been on hydrocodone by my partner, so provided a new prescription today -Patient will next be seen at the date of surgery   The patient has symptoms consistent with lumbar stenosis and neurogenic claudication. The patient's symptoms were not improving with conservative treatment and he had developed progressive neurologic deficits in the left leg so operative management was discussed in the form of L4 and L5 segment laminectomies with left-sided foraminotomy. The risks including but not limited to iatrogenic instability, dural tear, nerve root injury, paralysis, persistent pain, infection, bleeding, heart attack, death, stroke, fracture, blindness, and need for additional procedures were discussed with the patient. The benefit of the surgery would be improvement in the patient's symptoms related to the lumbar stenosis which would be the leg pain. I explained that back pain relief is not the goal of the surgery and it is not reliably alleviated with this surgery.  I told him that he may get some return of his strength and sensation in the left leg but it may not fully return after surgery.  The alternatives to surgical management were covered with the patient and included continued monitoring, physical therapy, over-the-counter pain medications, ambulatory aids, injections,  and activity modification. All the patient's questions were answered to his and his wife's satisfaction. After this discussion, the patient expressed understanding and elected to proceed with surgical intervention.     ___________________________________________________________________________   History:  Patient is a 78 y.o. male who presents today for lumbar spine.  Patient has had about 2 months of low back pain that radiates into the bilateral buttock and the left lower extremity.  He states it started after a fall while working with his lawn more.  He notes the pain is more severe in the left leg.  He feels it along the posterior and lateral aspect of the thigh into the left leg in multiple distributions.  He has decreased sensation distal to the knee on the left side that has been getting worse with time.  He also has noticed a foot drop on the left side that has been getting worse with time.  He does not have any paresthesias or numbness in the right lower extremity.   Weakness: Yes, left foot and ankle.  No other weakness noted Symptoms of imbalance: Yes, because of his foot drop he feels off balance Paresthesias and numbness: Yes, has decree sensation distal to the knee on the left side Bowel or bladder incontinence: Denies Saddle anesthesia: Denies  Treatments tried: Hydrocodone, gabapentin, prednisone  Review of systems: Denies fevers and chills, night sweats, unexplained weight loss, history of cancer.  Has had pain that wakes him at night  Past medical history: Osteoarthritis Hypertension Hypothyroidism HLD  Allergies: Aspirin, codeine, morphine  Past surgical history:  Appendectomy ACDF Left elbow I&D Knee arthroscopy Left elbow olecranon bursectomy Left elbow fracture ORIF Left THA Bilateral TKA  Social history: Denies use of nicotine product (smoking,  vaping, patches, smokeless) Alcohol use: Denies Denies recreational drug use   Physical Exam:  BMI of  30.7  General: no acute distress, appears stated age Neurologic: alert, answering questions appropriately, following commands Respiratory: unlabored breathing on room air, symmetric chest rise Psychiatric: appropriate affect, normal cadence to speech   MSK (spine):  -Strength exam      Left  Right EHL    3/5  5/5 TA    3/5  5/5 GSC    5/5  5/5 Knee extension  5/5  5/5 Hip flexion   5/5  5/5  -Sensory exam    Sensation intact to light touch in L3-S1 nerve distributions of bilateral lower extremities  -Achilles DTR: 1/4 on the left, 1/4 on the right -Patellar tendon DTR: 1/4 on the left, 1/4 on the right  -Straight leg raise: Negative bilaterally -Femoral nerve stretch test: Negative bilaterally -Clonus: no beats bilaterally  -Left hip exam: No pain through range of motion, negative Stinchfield, negative FABER -Right hip exam: No pain through range of motion, negative Stinchfield, negative FABER  Imaging: XR of the lumbar spine from 10/23/2022 was independently reviewed and interpreted, showing spondylolisthesis at L5/S1.  No fracture or dislocation seen.  No evidence of instability on flexion/extension views.  Disc height loss at L3/4, L4/5, L5/S1.  Facet arthropathy in the lower lumbar spine.  MRI of the lumbar spine from 10/11/2022 was independently reviewed and interpreted, showing lateral recess stenosis bilaterally (R>L) at L3/4. Central, lateral recess, and foraminal stenosis at L4/5. Bilateral foraminal stenosis at L5/S1.    Patient name: Ryan [name] Patient MRN: [id number] Date of visit: 10/23/22

## 2022-10-29 ENCOUNTER — Ambulatory Visit (HOSPITAL_COMMUNITY): Payer: PPO

## 2022-11-04 ENCOUNTER — Ambulatory Visit: Payer: PPO | Admitting: Orthopedic Surgery

## 2022-11-11 NOTE — Pre-Procedure Instructions (Signed)
Surgical Instructions    Your procedure is scheduled on November 19, 2022.  Report to [facility] Main Entrance "A" at 5:30 A.M., then check in with the Admitting office.  Call this number if you have problems the morning of surgery:  [phone]   If you have any questions prior to your surgery date call [phone]: Open Monday-Friday 8am-4pm If you experience any cold or flu symptoms such as cough, fever, chills, shortness of breath, etc. between now and your scheduled surgery, please notify us at the above number     Remember:  Do not eat after midnight the night before your surgery  You may drink clear liquids until 4:30AM the morning of your surgery.   Clear liquids allowed are: Water, Non-Citrus Juices (without pulp), Carbonated Beverages, Clear Tea, Black Coffee ONLY (NO MILK, CREAM OR POWDERED CREAMER of any kind), and Gatorade   Patient Instructions  The night before surgery:  No food after midnight. ONLY clear liquids after midnight  The day of surgery (if you do NOT have diabetes):  Drink ONE (1) Pre-Surgery Clear Ensure by 4:30AM the morning of surgery. Drink in one sitting. Do not sip.  This drink was given to you during your hospital  pre-op appointment visit.  Nothing else to drink after completing the  Pre-Surgery Clear Ensure.        If you have questions, please contact your surgeon's office.   Take these medicines the morning of surgery with A SIP OF WATER:  levothyroxine (SYNTHROID, LEVOTHROID)  rosuvastatin (CRESTOR   As of today, STOP taking any Aspirin (unless otherwise instructed by your surgeon) Aleve, Naproxen, Ibuprofen, Motrin, Advil, Goody's, BC's, all herbal medications, fish oil, and all vitamins.            [facility] [facility] is not responsible for any belongings or valuables.    Do NOT Smoke (Tobacco/Vaping)  24 hours prior to your procedure  If you use a CPAP at night, you may bring your mask for your overnight stay.   Contacts, glasses,  hearing aids, dentures or partials may not be worn into surgery, please bring cases for these belongings   For patients admitted to the hospital, discharge time will be determined by your treatment team.   Patients discharged the day of surgery will not be allowed to drive home, and someone needs to stay with them for 24 hours.   SURGICAL WAITING ROOM VISITATION Patients having surgery or a procedure may have no more than 2 support people in the waiting area - these visitors may rotate.   Children under the age of 89 must have an adult with them who is not the patient. If the patient needs to stay at the hospital during part of their recovery, the visitor guidelines for inpatient rooms apply. Pre-op nurse will coordinate an appropriate time for 1 support person to accompany patient in pre-op.  This support person may not rotate.   Please refer to [email or web address] for the visitor guidelines for Inpatients (after your surgery is over and you are in a regular room).    Special instructions:    Oral Hygiene is also important to reduce your risk of infection.  Remember - BRUSH YOUR TEETH THE MORNING OF SURGERY WITH YOUR REGULAR TOOTHPASTE     Pre-operative 5 CHG Bath Instructions   You can play a key role in reducing the risk of infection after surgery. Your skin needs to be as free of germs as possible. You can reduce the number of  germs on your skin by washing with CHG (chlorhexidine gluconate) soap before surgery. CHG is an antiseptic soap that kills germs and continues to kill germs even after washing.   DO NOT use if you have an allergy to chlorhexidine/CHG or antibacterial soaps. If your skin becomes reddened or irritated, stop using the CHG and notify one of our RNs at [phone].   Please shower with the CHG soap starting 4 days before surgery using the following schedule:     Please keep in mind the following:  DO NOT shave,  including legs and underarms, starting the day of your first shower.   You may shave your face at any point before/day of surgery.  Place clean sheets on your bed the day you start using CHG soap. Use a clean washcloth (not used since being washed) for each shower. DO NOT sleep with pets once you start using the CHG.   CHG Shower Instructions:  If you choose to wash your hair and private area, wash first with your normal shampoo/soap.  After you use shampoo/soap, rinse your hair and body thoroughly to remove shampoo/soap residue.  Turn the water OFF and apply about 3 tablespoons (45 ml) of CHG soap to a CLEAN washcloth.  Apply CHG soap ONLY FROM YOUR NECK DOWN TO YOUR TOES (washing for 3-5 minutes)  DO NOT use CHG soap on face, private areas, open wounds, or sores.  Pay special attention to the area where your surgery is being performed.  If you are having back surgery, having someone wash your back for you may be helpful. Wait 2 minutes after CHG soap is applied, then you may rinse off the CHG soap.  Pat dry with a clean towel  Put on clean clothes/pajamas   If you choose to wear lotion, please use ONLY the CHG-compatible lotions on the back of this paper.     Additional instructions for the day of surgery: DO NOT APPLY any lotions, deodorants, cologne, or perfumes.   Put on clean/comfortable clothes.  Brush your teeth.  Ask your nurse before applying any prescription medications to the skin. Do not wear jewelry or makeup. Men may shave face and neck. Do not bring valuables to the hospital. Do not wear nail polish, gel polish, artificial nails, or any other type of covering on natural nails (fingers and toes) If you have artificial nails or gel coating that need to be removed by a nail salon, please have this removed prior to surgery. Artificial nails or gel coating may interfere with anesthesia's ability to adequately monitor your vital signs.     CHG Compatible Lotions   Aveeno  Moisturizing lotion  Cetaphil Moisturizing Cream  Cetaphil Moisturizing Lotion  Clairol Herbal Essence Moisturizing Lotion, Dry Skin  Clairol Herbal Essence Moisturizing Lotion, Extra Dry Skin  Clairol Herbal Essence Moisturizing Lotion, Normal Skin  Curel Age Defying Therapeutic Moisturizing Lotion with Alpha Hydroxy  Curel Extreme Care Body Lotion  Curel Soothing Hands Moisturizing Hand Lotion  Curel Therapeutic Moisturizing Cream, Fragrance-Free  Curel Therapeutic Moisturizing Lotion, Fragrance-Free  Curel Therapeutic Moisturizing Lotion, Original Formula  Eucerin Daily Replenishing Lotion  Eucerin Dry Skin Therapy Plus Alpha Hydroxy Crme  Eucerin Dry Skin Therapy Plus Alpha Hydroxy Lotion  Eucerin Original Crme  Eucerin Original Lotion  Eucerin Plus Crme Eucerin Plus Lotion  Eucerin TriLipid Replenishing Lotion  Keri Anti-Bacterial Hand Lotion  Keri Deep Conditioning Original Lotion Dry Skin Formula Softly Scented  Keri Deep Conditioning Original Lotion, Fragrance Free Sensitive Skin Formula  Keri Lotion Fast [employer] Fragrance Free Sensitive Skin Formula  Keri Lotion Fast Absorbing Softly Scented Dry Skin Formula  Keri Original Lotion  Keri Skin Renewal Lotion Keri Silky Smooth Lotion  Keri Silky Smooth Sensitive Skin Lotion  Nivea Body Creamy Conditioning Oil  Nivea Body Extra Enriched Lotion  Nivea Body Original Lotion  Nivea Body Sheer Moisturizing Lotion Nivea Crme  Nivea Skin Firming Lotion  NutraDerm 30 Skin Lotion  NutraDerm Skin Lotion  NutraDerm Therapeutic Skin Cream  NutraDerm Therapeutic Skin Lotion  ProShield Protective Hand Cream  Provon moisturizing lotion     If you received a COVID test during your pre-op visit, it is requested that you wear a mask when out in public, stay away from anyone that may not be feeling well, and notify your surgeon if you develop symptoms. If you have been in contact with anyone that has tested positive in the last 10  days, please notify your surgeon.    Please read over the following fact sheets that you were given.

## 2022-11-12 ENCOUNTER — Encounter (HOSPITAL_COMMUNITY)
Admission: RE | Admit: 2022-11-12 | Discharge: 2022-11-12 | Disposition: A | Discharge status: Home / Self Care | Payer: PPO | Source: Ambulatory Visit | Attending: Orthopedic Surgery | Admitting: Orthopedic Surgery

## 2022-11-12 ENCOUNTER — Telehealth: Payer: Self-pay | Admitting: Orthopedic Surgery

## 2022-11-12 ENCOUNTER — Encounter (HOSPITAL_COMMUNITY): Payer: Self-pay

## 2022-11-12 ENCOUNTER — Other Ambulatory Visit: Payer: Self-pay

## 2022-11-12 VITALS — BP 144/80 | HR 82 | Temp 97.8°F | Resp 18 | Ht 68.0 in | Wt 201.2 lb

## 2022-11-12 DIAGNOSIS — I110 Hypertensive heart disease with heart failure: Secondary | ICD-10-CM | POA: Diagnosis not present

## 2022-11-12 DIAGNOSIS — M48062 Spinal stenosis, lumbar region with neurogenic claudication: Secondary | ICD-10-CM

## 2022-11-12 DIAGNOSIS — Z01812 Encounter for preprocedural laboratory examination: Secondary | ICD-10-CM | POA: Insufficient documentation

## 2022-11-12 DIAGNOSIS — Z01818 Encounter for other preprocedural examination: Secondary | ICD-10-CM | POA: Diagnosis present

## 2022-11-12 DIAGNOSIS — I5022 Chronic systolic (congestive) heart failure: Secondary | ICD-10-CM | POA: Insufficient documentation

## 2022-11-12 HISTORY — DX: Cardiac murmur, unspecified: R01.1

## 2022-11-12 LAB — BASIC METABOLIC PANEL
Anion gap: 9 (ref 5–15)
BUN: 11 mg/dL (ref 8–23)
CO2: 26 mmol/L (ref 22–32)
Calcium: 9.1 mg/dL (ref 8.9–10.3)
Chloride: 104 mmol/L (ref 98–111)
Creatinine, Ser: 1.17 mg/dL (ref 0.61–1.24)
GFR, Estimated: >60 mL/min (ref >60)
Glucose, Bld: 99 mg/dL (ref 70–99)
Potassium: 4.3 mmol/L (ref 3.5–5.1)
Sodium: 139 mmol/L (ref 135–145)

## 2022-11-12 LAB — CBC
HCT: 42.1 % (ref 39.0–52.0)
Hemoglobin: 13.4 g/dL (ref 13.0–17.0)
MCH: 30.2 pg (ref 26.0–34.0)
MCHC: 31.8 g/dL (ref 30.0–36.0)
MCV: 95 fL (ref 80.0–100.0)
Platelets: 300 10*3/uL (ref 150–400)
RBC: 4.43 MIL/uL (ref 4.22–5.81)
RDW: 13.4 % (ref 11.5–15.5)
WBC: 9.1 10*3/uL (ref 4.0–10.5)
nRBC: 0 % (ref 0.0–0.2)

## 2022-11-12 LAB — SURGICAL PCR SCREEN
MRSA, PCR: NEGATIVE
Staphylococcus aureus: POSITIVE — AB

## 2022-11-12 NOTE — Progress Notes (Signed)
PCP - Ass[place]ta Found- [facility] Medical Cardiologist - Jonelle Sidle, MD   PPM/ICD - denies   Chest x-ray - N/A EKG - 02/12/22 Stress Test - 08/29/20 ECHO - 08/17/20 Cardiac Cath - denies  Sleep Study - denies   Fasting Blood Sugar - N/A   Last dose of GLP1 agonist-  N/A   Blood Thinner Instructions: N/A Aspirin Instructions: N/A  ERAS Protcol - ERAS + ensure per order   COVID TEST- N/A   Anesthesia review: yes- history heart murmur, review EKG. Pt states he was not in need of any medical/cardiac clearance that he knows of. Pt denies any new cardiac issues/symptoms since last seeing Dr. Diona Browner.   Patient denies shortness of breath, fever, cough and chest pain at PAT appointment   All instructions explained to the patient, with a verbal understanding of the material. Patient agrees to go over the instructions while at home for a better understanding. The opportunity to ask questions was provided.

## 2022-11-12 NOTE — Progress Notes (Signed)
   11/12/22 1018  OBSTRUCTIVE SLEEP APNEA  Have you ever been diagnosed with sleep apnea through a sleep study? No  Do you snore loudly (loud enough to be heard through closed doors)?  1  Do you often feel tired, fatigued, or sleepy during the daytime (such as falling asleep during driving or talking to someone)? 0  Has anyone observed you stop breathing during your sleep? 0  Do you have, or are you being treated for high blood pressure? 1  BMI more than 35 kg/m2? 0  Age > 50 (1-yes) 1  Neck circumference greater than:Male 16 inches or larger, Male 17inches or larger? 1  Male Gender (Yes=1) 1  Obstructive Sleep Apnea Score 5  Score 5 or greater  Results sent to PCP

## 2022-11-12 NOTE — Telephone Encounter (Signed)
Pt wife is requesting a letter stating she is allowed to stay with her husband overnight after his surgery on 11/19/22 due to husband having severe hallucinations in 2022.  Pt requesting the letter to be faxed.

## 2022-11-13 ENCOUNTER — Encounter: Payer: Self-pay | Admitting: Radiology

## 2022-11-13 NOTE — Progress Notes (Signed)
Anesthesia Chart Review:  Follows with cardiology for history of HTN and HFmrEF.  EF 45 to 50% by echocardiogram, 51% by Myoview.  Last seen by Dr. Diona Browner 04/03/2022.  Per note, "He reports no angina or increasing shortness of breath, weight has been stable. Continue observation for now, would consider repeat echocardiogram for visit in 1 year."  Preop labs reviewed, WNL.  EKG 02/11/2022: Sinus rhythm with Premature atrial complexes.  Rate 82.  Right bundle branch block. Left anterior fascicular block. Septal infarct , age undetermined  Nuclear stress 08/29/2020: Lexiscan stress is electrically negative for ischemia Myoview scan with decreased tracer activity in the inferior wall consistent with probable soft tissue attenuation; cannot exclude subendocardial scar. No evidence of ischemia. LVEF calculated at 51% with normal wall thickening Consider echo to further define wall motion/ LVEF Low risk study  TTE 08/17/2020:  1. Left ventricular ejection fraction, by estimation, is 45 to 50%. The  left ventricle has mildly decreased function. The left ventricle  demonstrates global hypokinesis. There is mild left ventricular  hypertrophy. Left ventricular diastolic parameters  are indeterminate.   2. Right ventricular systolic function is normal. The right ventricular  size is normal. There is normal pulmonary artery systolic pressure. The  estimated right ventricular systolic pressure is 24.7 mmHg.   3. The mitral valve is grossly normal. Mild mitral valve regurgitation.   4. The aortic valve is tricuspid. There is mild calcification of the  aortic valve. Aortic valve regurgitation is not visualized. Mild to  moderate aortic valve sclerosis/calcification is present, without any  evidence of aortic stenosis.   5. The inferior vena cava is normal in size with greater than 50%  respiratory variability, suggesting right atrial pressure of 3 mmHg.     [facility] Short Stay  Center/Anesthesiology Phone [phone] 11/13/2022 12:36 PM

## 2022-11-13 NOTE — Anesthesia Preprocedure Evaluation (Addendum)
Anesthesia Evaluation  Patient identified by MRN, date of birth, ID band Patient awake    Reviewed: Allergy & Precautions, NPO status , Patient's Chart, lab work & pertinent test results  Airway Mallampati: II  TM Distance: >3 FB Neck ROM: Full    Dental   Pulmonary former smoker   breath sounds clear to auscultation       Cardiovascular hypertension, Pt. on medications  Rhythm:Regular Rate:Normal     Neuro/Psych negative neurological ROS     GI/Hepatic negative GI ROS, Neg liver ROS,,,  Endo/Other  Hypothyroidism    Renal/GU negative Renal ROS     Musculoskeletal  (+) Arthritis ,    Abdominal   Peds  Hematology negative hematology ROS (+)   Anesthesia Other Findings   Reproductive/Obstetrics                             Anesthesia Physical Anesthesia Plan  ASA: 2  Anesthesia Plan: General   Post-op Pain Management: Tylenol PO (pre-op)*   Induction: Intravenous  PONV Risk Score and Plan: 2 and Dexamethasone, Ondansetron and Treatment may vary due to age or medical condition  Airway Management Planned: Oral ETT  Additional Equipment: None  Intra-op Plan:   Post-operative Plan: Extubation in OR  Informed Consent: I have reviewed the patients History and Physical, chart, labs and discussed the procedure including the risks, benefits and alternatives for the proposed anesthesia with the patient or authorized representative who has indicated his/her understanding and acceptance.     Dental advisory given  Plan Discussed with: CRNA  Anesthesia Plan Comments: (PAT note by Antionette Poles, PA-C: Follows with cardiology for history of HTN and HFmrEF.  EF 45 to 50% by echocardiogram, 51% by Myoview.  Last seen by Dr. Diona Browner 04/03/2022.  Per note, "He reports no angina or increasing shortness of breath, weight has been stable. Continue observation for now, would consider repeat  echocardiogram for visit in 1 year."  Preop labs reviewed, WNL.  EKG 02/11/2022: Sinus rhythm with Premature atrial complexes.  Rate 82.  Right bundle branch block. Left anterior fascicular block. Septal infarct , age undetermined  Nuclear stress 08/29/2020:  Lexiscan stress is electrically negative for ischemia  Myoview scan with decreased tracer activity in the inferior wall consistent with probable soft tissue attenuation; cannot exclude subendocardial scar. No evidence of ischemia.  LVEF calculated at 51% with normal wall thickening Consider echo to further define wall motion/ LVEF  Low risk study  TTE 08/17/2020: 1. Left ventricular ejection fraction, by estimation, is 45 to 50%. The  left ventricle has mildly decreased function. The left ventricle  demonstrates global hypokinesis. There is mild left ventricular  hypertrophy. Left ventricular diastolic parameters  are indeterminate.  2. Right ventricular systolic function is normal. The right ventricular  size is normal. There is normal pulmonary artery systolic pressure. The  estimated right ventricular systolic pressure is 24.7 mmHg.  3. The mitral valve is grossly normal. Mild mitral valve regurgitation.  4. The aortic valve is tricuspid. There is mild calcification of the  aortic valve. Aortic valve regurgitation is not visualized. Mild to  moderate aortic valve sclerosis/calcification is present, without any  evidence of aortic stenosis.  5. The inferior vena cava is normal in size with greater than 50%  respiratory variability, suggesting right atrial pressure of 3 mmHg.   )        Anesthesia Quick Evaluation

## 2022-11-13 NOTE — Telephone Encounter (Signed)
I did write the note and I put it the chart so the hospital staff can see it. Also I called and let Ivor Messier know that I did this.

## 2022-11-18 NOTE — H&P (Signed)
Orthopedic Spine Surgery H&P Note  Assessment: Patient is a 77 y.o. male with low back pain that radiates into the bilateral buttocks and the left leg.  Symptoms and imaging consistent with neurogenic claudication   Plan: -Out of bed as tolerated, activity as tolerated, no brace -Covered the risks of surgery one more time with the patient and patient elected to proceed with planned surgery -Written consent verified -Hold anticoagulation in anticipation of surgery -Ancef and TXA on all to OR -NPO for procedure -Site marked -To OR when ready  The patient has symptoms consistent with lumbar stenosis. The patient's symptoms were not improving with conservative treatment so operative management was discussed in the form of L4 and L5 segment laminectomies with left-sided foraminotomy.  The risks including but not limited to iatrogenic instability, dural tear, nerve root injury, paralysis, persistent pain, infection, bleeding, heart attack, death, stroke, fracture, DVT/PE, and need for additional procedures were discussed with the patient. The benefit of the surgery would be improvement in the patient's radiating leg pain. I explained that back pain relief is not the goal of the surgery and it is not reliably alleviated with this surgery. The alternatives to surgical management were covered with the patient and included continued monitoring, physical therapy, over-the-counter pain medications, ambulatory aids, injections, and activity modification. All the patient's questions were answered to his satisfaction. After this discussion, the patient expressed understanding and elected to proceed with surgical intervention.     ___________________________________________________________________________  Chief Complaint: Low back pain that radiates into bilateral buttock and into the left leg  History: Patient is 77 y.o. male who has been previously seen in the office for low back pain that radiates into the  bilateral buttocks and into the left leg.  Pain was worse with upright position and improved with flexed posture.  His workup was consistent with lumbar stenosis and neurogenic claudication.  His symptoms failed to improve with conservative treatment so operative management was discussed at the last office visit. The patient presents today with no changes in his symptoms since the last office visit. See previous office note for further details.    Review of systems: General: denies fevers and chills, myalgias Neurologic: denies recent changes in vision, slurred speech Abdomen: denies nausea, vomiting, hematemesis Respiratory: denies cough, shortness of breath  Past medical history: Osteoarthritis Hypertension Hypothyroidism HLD   Allergies: Aspirin, codeine, morphine   Past surgical history:  Appendectomy ACDF Left elbow I&D Knee arthroscopy Left elbow olecranon bursectomy Left elbow fracture ORIF Left THA Bilateral TKA   Social history: Denies use of nicotine product (smoking, vaping, patches, smokeless) Alcohol use: Denies Denies recreational drug use  Family history: -reviewed and not pertinent to lumbar stenosis with neurogenic claudication   Physical Exam:  General: no acute distress, appears stated age Neurologic: alert, answering questions appropriately, following commands Cardiovascular: regular rate, no cyanosis Respiratory: unlabored breathing on room air, symmetric chest rise Psychiatric: appropriate affect, normal cadence to speech   MSK (spine):  -Strength exam      Left  Right  EHL    5/5  5/5 TA    5/5  5/5 GSC    5/5  5/5 Knee extension  5/5  5/5 Knee flexion   5/5  5/5 Hip flexion   5/5  5/5  -Sensory exam    Sensation intact to light touch in L3-S1 nerve distributions of bilateral lower extremities   Patient name: Ryan [name] Patient MRN: [id number] Date: 11/19/2022

## 2022-11-19 ENCOUNTER — Encounter (HOSPITAL_COMMUNITY)
Admission: RE | Disposition: A | Discharge status: Home / Self Care | Payer: Self-pay | Source: Home / Self Care | Attending: Orthopedic Surgery

## 2022-11-19 ENCOUNTER — Other Ambulatory Visit: Payer: Self-pay

## 2022-11-19 ENCOUNTER — Encounter (HOSPITAL_COMMUNITY): Payer: Self-pay | Admitting: Orthopedic Surgery

## 2022-11-19 ENCOUNTER — Ambulatory Visit (HOSPITAL_COMMUNITY): Payer: PPO | Admitting: Physician Assistant

## 2022-11-19 ENCOUNTER — Observation Stay (HOSPITAL_COMMUNITY)
Admission: RE | Admit: 2022-11-19 | Discharge: 2022-11-20 | Disposition: A | Discharge status: Home / Self Care | Payer: PPO | Attending: Orthopedic Surgery | Admitting: Orthopedic Surgery

## 2022-11-19 ENCOUNTER — Ambulatory Visit (HOSPITAL_COMMUNITY): Payer: PPO

## 2022-11-19 ENCOUNTER — Ambulatory Visit (HOSPITAL_BASED_OUTPATIENT_CLINIC_OR_DEPARTMENT_OTHER): Payer: PPO | Admitting: Anesthesiology

## 2022-11-19 DIAGNOSIS — M48062 Spinal stenosis, lumbar region with neurogenic claudication: Secondary | ICD-10-CM | POA: Diagnosis not present

## 2022-11-19 DIAGNOSIS — I10 Essential (primary) hypertension: Secondary | ICD-10-CM | POA: Diagnosis not present

## 2022-11-19 DIAGNOSIS — Z96653 Presence of artificial knee joint, bilateral: Secondary | ICD-10-CM | POA: Insufficient documentation

## 2022-11-19 DIAGNOSIS — Z96642 Presence of left artificial hip joint: Secondary | ICD-10-CM | POA: Diagnosis not present

## 2022-11-19 DIAGNOSIS — Z981 Arthrodesis status: Secondary | ICD-10-CM | POA: Diagnosis not present

## 2022-11-19 DIAGNOSIS — E039 Hypothyroidism, unspecified: Secondary | ICD-10-CM | POA: Diagnosis not present

## 2022-11-19 HISTORY — PX: DECOMPRESSIVE LUMBAR LAMINECTOMY LEVEL 1: SHX5791

## 2022-11-19 SURGERY — DECOMPRESSIVE LUMBAR LAMINECTOMY LEVEL 1
Anesthesia: General

## 2022-11-19 MED ORDER — LACTATED RINGERS IV SOLN: INTRAVENOUS | Status: DC

## 2022-11-19 MED ORDER — ONDANSETRON HCL 4 MG PO TABS: 4.0000 mg | ORAL_TABLET | Freq: Four times a day (QID) | ORAL | Status: DC | PRN

## 2022-11-19 MED ORDER — MIDAZOLAM HCL 2 MG/2ML IJ SOLN
INTRAMUSCULAR | Status: DC | PRN
  Administered 2022-11-19: 1 mg via INTRAVENOUS

## 2022-11-19 MED ORDER — MIDAZOLAM HCL 2 MG/2ML IJ SOLN
INTRAMUSCULAR | Status: AC
  Filled 2022-11-19: qty 2

## 2022-11-19 MED ORDER — DEXAMETHASONE SODIUM PHOSPHATE 10 MG/ML IJ SOLN
INTRAMUSCULAR | Status: AC
  Filled 2022-11-19: qty 1

## 2022-11-19 MED ORDER — OXYCODONE HCL 5 MG PO TABS: 5.0000 mg | ORAL_TABLET | ORAL | 0 refills | Status: AC | PRN

## 2022-11-19 MED ORDER — LIDOCAINE 2% (20 MG/ML) 5 ML SYRINGE
INTRAMUSCULAR | Status: DC | PRN
  Administered 2022-11-19: 60 mg via INTRAVENOUS

## 2022-11-19 MED ORDER — LEVOTHYROXINE SODIUM 25 MCG PO TABS
25.0000 ug | ORAL_TABLET | Freq: Every day | ORAL | Status: DC
  Administered 2022-11-20: 25 ug via ORAL
  Filled 2022-11-19: qty 1

## 2022-11-19 MED ORDER — THROMBIN 20000 UNITS EX SOLR
CUTANEOUS | Status: AC
  Filled 2022-11-19: qty 20000

## 2022-11-19 MED ORDER — VANCOMYCIN HCL 1000 MG IV SOLR
INTRAVENOUS | Status: AC
  Filled 2022-11-19: qty 20

## 2022-11-19 MED ORDER — ONDANSETRON HCL 4 MG/2ML IJ SOLN
INTRAMUSCULAR | Status: AC
  Filled 2022-11-19: qty 2

## 2022-11-19 MED ORDER — AMISULPRIDE (ANTIEMETIC) 5 MG/2ML IV SOLN: 10.0000 mg | Freq: Once | INTRAVENOUS | Status: DC | PRN

## 2022-11-19 MED ORDER — PHENYLEPHRINE 80 MCG/ML (10ML) SYRINGE FOR IV PUSH (FOR BLOOD PRESSURE SUPPORT)
PREFILLED_SYRINGE | INTRAVENOUS | Status: AC
  Filled 2022-11-19: qty 10

## 2022-11-19 MED ORDER — FENTANYL CITRATE (PF) 100 MCG/2ML IJ SOLN
INTRAMUSCULAR | Status: AC
  Filled 2022-11-19: qty 2

## 2022-11-19 MED ORDER — ALBUMIN HUMAN 5 % IV SOLN: INTRAVENOUS | Status: DC | PRN

## 2022-11-19 MED ORDER — CHLORHEXIDINE GLUCONATE 0.12 % MT SOLN
15.0000 mL | Freq: Once | OROMUCOSAL | Status: AC
  Administered 2022-11-19: 15 mL via OROMUCOSAL
  Filled 2022-11-19: qty 15

## 2022-11-19 MED ORDER — METHOCARBAMOL 500 MG PO TABS
ORAL_TABLET | ORAL | Status: AC
  Filled 2022-11-19: qty 1

## 2022-11-19 MED ORDER — ROCURONIUM BROMIDE 10 MG/ML (PF) SYRINGE
PREFILLED_SYRINGE | INTRAVENOUS | Status: AC
  Filled 2022-11-19: qty 10

## 2022-11-19 MED ORDER — EPHEDRINE SULFATE-NACL 50-0.9 MG/10ML-% IV SOSY
PREFILLED_SYRINGE | INTRAVENOUS | Status: DC | PRN
  Administered 2022-11-19 (×3): 5 mg via INTRAVENOUS

## 2022-11-19 MED ORDER — ACETAMINOPHEN 500 MG PO TABS
1000.0000 mg | ORAL_TABLET | Freq: Once | ORAL | Status: AC
  Administered 2022-11-19: 1000 mg via ORAL
  Filled 2022-11-19: qty 2

## 2022-11-19 MED ORDER — SUGAMMADEX SODIUM 200 MG/2ML IV SOLN
INTRAVENOUS | Status: DC | PRN
  Administered 2022-11-19: 200 mg via INTRAVENOUS

## 2022-11-19 MED ORDER — FENTANYL CITRATE (PF) 100 MCG/2ML IJ SOLN
25.0000 ug | INTRAMUSCULAR | Status: DC | PRN
  Administered 2022-11-19: 50 ug via INTRAVENOUS
  Administered 2022-11-19 (×2): 25 ug via INTRAVENOUS

## 2022-11-19 MED ORDER — ROCURONIUM BROMIDE 10 MG/ML (PF) SYRINGE
PREFILLED_SYRINGE | INTRAVENOUS | Status: DC | PRN
  Administered 2022-11-19: 20 mg via INTRAVENOUS
  Administered 2022-11-19: 60 mg via INTRAVENOUS
  Administered 2022-11-19: 20 mg via INTRAVENOUS

## 2022-11-19 MED ORDER — FENTANYL CITRATE (PF) 250 MCG/5ML IJ SOLN
INTRAMUSCULAR | Status: DC | PRN
  Administered 2022-11-19 (×3): 50 ug via INTRAVENOUS

## 2022-11-19 MED ORDER — PROPOFOL 10 MG/ML IV BOLUS
INTRAVENOUS | Status: DC | PRN
  Administered 2022-11-19: 120 mg via INTRAVENOUS

## 2022-11-19 MED ORDER — SENNA 8.6 MG PO TABS
1.0000 | ORAL_TABLET | Freq: Two times a day (BID) | ORAL | Status: DC
  Administered 2022-11-19: 8.6 mg via ORAL
  Filled 2022-11-19: qty 1

## 2022-11-19 MED ORDER — ORAL CARE MOUTH RINSE: 15.0000 mL | Freq: Once | OROMUCOSAL | Status: AC

## 2022-11-19 MED ORDER — PROPOFOL 10 MG/ML IV BOLUS
INTRAVENOUS | Status: AC
  Filled 2022-11-19: qty 20

## 2022-11-19 MED ORDER — POLYETHYLENE GLYCOL 3350 17 G PO PACK
17.0000 g | PACK | Freq: Every day | ORAL | Status: DC
  Administered 2022-11-19: 17 g via ORAL
  Filled 2022-11-19: qty 1

## 2022-11-19 MED ORDER — ACETAMINOPHEN 500 MG PO TABS
1000.0000 mg | ORAL_TABLET | Freq: Three times a day (TID) | ORAL | Status: DC
  Administered 2022-11-19 – 2022-11-20 (×3): 1000 mg via ORAL
  Filled 2022-11-19 (×3): qty 2

## 2022-11-19 MED ORDER — TRANEXAMIC ACID-NACL 1000-0.7 MG/100ML-% IV SOLN
1000.0000 mg | Freq: Once | INTRAVENOUS | Status: AC
  Administered 2022-11-19: 1000 mg via INTRAVENOUS
  Filled 2022-11-19: qty 100

## 2022-11-19 MED ORDER — CEFAZOLIN SODIUM-DEXTROSE 2-4 GM/100ML-% IV SOLN
2.0000 g | Freq: Four times a day (QID) | INTRAVENOUS | Status: AC
  Administered 2022-11-19 – 2022-11-20 (×3): 2 g via INTRAVENOUS
  Filled 2022-11-19 (×3): qty 100

## 2022-11-19 MED ORDER — METHOCARBAMOL 500 MG PO TABS: 500.0000 mg | ORAL_TABLET | Freq: Four times a day (QID) | ORAL | 0 refills | Status: AC

## 2022-11-19 MED ORDER — OXYCODONE HCL 5 MG PO TABS
5.0000 mg | ORAL_TABLET | ORAL | Status: DC | PRN
  Administered 2022-11-19: 5 mg via ORAL
  Administered 2022-11-19 (×2): 10 mg via ORAL
  Filled 2022-11-19: qty 2
  Filled 2022-11-19 (×2): qty 1
  Filled 2022-11-19: qty 2

## 2022-11-19 MED ORDER — AMLODIPINE BESYLATE 5 MG PO TABS
5.0000 mg | ORAL_TABLET | Freq: Every day | ORAL | Status: DC
  Administered 2022-11-19: 5 mg via ORAL
  Filled 2022-11-19: qty 1

## 2022-11-19 MED ORDER — ACETAMINOPHEN 500 MG PO TABS: 1000.0000 mg | ORAL_TABLET | Freq: Three times a day (TID) | ORAL | 0 refills | Status: AC

## 2022-11-19 MED ORDER — EPHEDRINE 5 MG/ML INJ
INTRAVENOUS | Status: AC
  Filled 2022-11-19: qty 5

## 2022-11-19 MED ORDER — ONDANSETRON HCL 4 MG/2ML IJ SOLN: 4.0000 mg | Freq: Four times a day (QID) | INTRAMUSCULAR | Status: DC | PRN

## 2022-11-19 MED ORDER — POVIDONE-IODINE 10 % EX SWAB
2.0000 | Freq: Once | CUTANEOUS | Status: AC
  Administered 2022-11-19: 2 via TOPICAL

## 2022-11-19 MED ORDER — CEFAZOLIN SODIUM-DEXTROSE 2-4 GM/100ML-% IV SOLN
2.0000 g | INTRAVENOUS | Status: AC
  Administered 2022-11-19: 2 g via INTRAVENOUS
  Filled 2022-11-19: qty 100

## 2022-11-19 MED ORDER — GLYCOPYRROLATE PF 0.2 MG/ML IJ SOSY
PREFILLED_SYRINGE | INTRAMUSCULAR | Status: DC | PRN
  Administered 2022-11-19: .1 mg via INTRAVENOUS

## 2022-11-19 MED ORDER — OXYCODONE HCL 5 MG PO TABS
ORAL_TABLET | ORAL | Status: AC
  Filled 2022-11-19: qty 2

## 2022-11-19 MED ORDER — ROSUVASTATIN CALCIUM 5 MG PO TABS
10.0000 mg | ORAL_TABLET | Freq: Every day | ORAL | Status: DC
  Administered 2022-11-19: 10 mg via ORAL
  Filled 2022-11-19: qty 2

## 2022-11-19 MED ORDER — 0.9 % SODIUM CHLORIDE (POUR BTL) OPTIME
TOPICAL | Status: DC | PRN
  Administered 2022-11-19: 1000 mL

## 2022-11-19 MED ORDER — TRANEXAMIC ACID-NACL 1000-0.7 MG/100ML-% IV SOLN
1000.0000 mg | INTRAVENOUS | Status: AC
  Administered 2022-11-19: 1000 mg via INTRAVENOUS
  Filled 2022-11-19: qty 100

## 2022-11-19 MED ORDER — DEXAMETHASONE SODIUM PHOSPHATE 10 MG/ML IJ SOLN
10.0000 mg | Freq: Once | INTRAMUSCULAR | Status: AC
  Administered 2022-11-19: 10 mg via INTRAVENOUS
  Filled 2022-11-19: qty 1

## 2022-11-19 MED ORDER — METHOCARBAMOL 500 MG PO TABS
500.0000 mg | ORAL_TABLET | Freq: Four times a day (QID) | ORAL | Status: DC
  Administered 2022-11-19 (×3): 500 mg via ORAL
  Filled 2022-11-19 (×2): qty 1

## 2022-11-19 MED ORDER — ONDANSETRON HCL 4 MG/2ML IJ SOLN
INTRAMUSCULAR | Status: DC | PRN
  Administered 2022-11-19: 4 mg via INTRAVENOUS

## 2022-11-19 MED ORDER — SENNA 8.6 MG PO TABS: 1.0000 | ORAL_TABLET | Freq: Two times a day (BID) | ORAL | 0 refills | Status: AC

## 2022-11-19 MED ORDER — PHENYLEPHRINE 80 MCG/ML (10ML) SYRINGE FOR IV PUSH (FOR BLOOD PRESSURE SUPPORT)
PREFILLED_SYRINGE | INTRAVENOUS | Status: DC | PRN
  Administered 2022-11-19: 160 ug via INTRAVENOUS
  Administered 2022-11-19 (×2): 80 ug via INTRAVENOUS
  Administered 2022-11-19: 160 ug via INTRAVENOUS

## 2022-11-19 MED ORDER — TAMSULOSIN HCL 0.4 MG PO CAPS
0.4000 mg | ORAL_CAPSULE | Freq: Every day | ORAL | Status: DC
  Administered 2022-11-19: 0.4 mg via ORAL
  Filled 2022-11-19: qty 1

## 2022-11-19 MED ORDER — FENTANYL CITRATE (PF) 250 MCG/5ML IJ SOLN
INTRAMUSCULAR | Status: AC
  Filled 2022-11-19: qty 5

## 2022-11-19 MED ORDER — POLYETHYLENE GLYCOL 3350 17 G PO PACK: 17.0000 g | PACK | Freq: Every day | ORAL | 0 refills | Status: AC

## 2022-11-19 MED ORDER — THROMBIN 20000 UNITS EX SOLR
CUTANEOUS | Status: DC | PRN
  Administered 2022-11-19: 20 mL via TOPICAL

## 2022-11-19 MED ORDER — LIDOCAINE 2% (20 MG/ML) 5 ML SYRINGE
INTRAMUSCULAR | Status: AC
  Filled 2022-11-19: qty 5

## 2022-11-19 MED ORDER — PHENYLEPHRINE HCL-NACL 20-0.9 MG/250ML-% IV SOLN
INTRAVENOUS | Status: DC | PRN
  Administered 2022-11-19: 25 ug/min via INTRAVENOUS

## 2022-11-19 MED ORDER — LACTATED RINGERS IV SOLN
INTRAVENOUS | Status: DC | PRN
Start: 2022-11-19 — End: 2022-11-19

## 2022-11-19 MED ORDER — IRBESARTAN 150 MG PO TABS: 300.0000 mg | ORAL_TABLET | Freq: Every day | ORAL | Status: DC

## 2022-11-19 SURGICAL SUPPLY — 50 items
APL SKNCLS STERI-STRIP NONHPOA (GAUZE/BANDAGES/DRESSINGS) ×1
BENZOIN TINCTURE PRP APPL 2/3 (GAUZE/BANDAGES/DRESSINGS) ×2 IMPLANT
BUR MATCHSTICK NEURO 3.0 LAGG (BURR) ×1 IMPLANT
CANISTER SUCT 3000ML PPV (MISCELLANEOUS) ×2 IMPLANT
CORD BIPOLAR FORCEPS 12FT (ELECTRODE) IMPLANT
COVER MAYO STAND STRL (DRAPES) ×3 IMPLANT
COVER SURGICAL LIGHT HANDLE (MISCELLANEOUS) ×1 IMPLANT
DRAIN HEMOVAC 7FR (DRAIN) ×2 IMPLANT
DRAPE C-ARM 42X72 X-RAY (DRAPES) ×2 IMPLANT
DRAPE UTILITY XL STRL (DRAPES) ×2 IMPLANT
DRESSING MEPILEX FLEX 4X4 (GAUZE/BANDAGES/DRESSINGS) ×2 IMPLANT
DRSG MEPILEX FLEX 4X4 (GAUZE/BANDAGES/DRESSINGS) ×1
DRSG MEPILEX POST OP 4X8 (GAUZE/BANDAGES/DRESSINGS) ×2 IMPLANT
DRSG OPSITE 4X5.5 SM (GAUZE/BANDAGES/DRESSINGS) IMPLANT
DRSG TEGADERM 4X10 (GAUZE/BANDAGES/DRESSINGS) ×2 IMPLANT
DRSG TEGADERM 4X4.75 (GAUZE/BANDAGES/DRESSINGS) ×3 IMPLANT
DURAPREP 26ML APPLICATOR (WOUND CARE) ×1 IMPLANT
ELECT BLADE 4.0 EZ CLEAN MEGAD (MISCELLANEOUS) ×1
ELECT PENCIL ROCKER SW 15FT (MISCELLANEOUS) ×2 IMPLANT
ELECT REM PT RETURN 9FT ADLT (ELECTROSURGICAL) ×1
ELECTRODE BLDE 4.0 EZ CLN MEGD (MISCELLANEOUS) ×2 IMPLANT
ELECTRODE REM PT RTRN 9FT ADLT (ELECTROSURGICAL) ×2 IMPLANT
GAUZE SPONGE 4X4 12PLY STRL (GAUZE/BANDAGES/DRESSINGS) ×2 IMPLANT
GLOVE BIO SURGEON STRL SZ7.5 (GLOVE) ×2 IMPLANT
GLOVE INDICATOR 7.5 STRL GRN (GLOVE) ×1 IMPLANT
GOWN STRL REUS W/ TWL LRG LVL3 (GOWN DISPOSABLE) ×2 IMPLANT
GOWN STRL REUS W/TWL LRG LVL3 (GOWN DISPOSABLE) ×1
GOWN STRL SURGICAL XL XLNG (GOWN DISPOSABLE) ×1 IMPLANT
KIT BASIN OR (CUSTOM PROCEDURE TRAY) ×2 IMPLANT
KIT POSITION SURG JACKSON T1 (MISCELLANEOUS) ×2 IMPLANT
KIT TURNOVER KIT B (KITS) ×2 IMPLANT
NDL 22X1.5 STRL (OR ONLY) (MISCELLANEOUS) ×1 IMPLANT
NEEDLE 22X1.5 STRL (OR ONLY) (MISCELLANEOUS) ×1
NS IRRIG 1000ML POUR BTL (IV SOLUTION) ×2 IMPLANT
PACK LAMINECTOMY ORTHO (CUSTOM PROCEDURE TRAY) ×1 IMPLANT
PATTIES SURGICAL .5 X.5 (GAUZE/BANDAGES/DRESSINGS) IMPLANT
SPONGE SURGIFOAM ABS GEL 100 (HEMOSTASIS) ×1 IMPLANT
SPONGE T-LAP 4X18 ~~LOC~~+RFID (SPONGE) ×2 IMPLANT
SUCTION TUBE FRAZIER 10FR DISP (SUCTIONS) ×2 IMPLANT
SUT BONE WAX W31G (SUTURE) ×2 IMPLANT
SUT MNCRL+ AB 3-0 CT1 36 (SUTURE) ×1 IMPLANT
SUT VIC AB 0 CT1 18XCR BRD8 (SUTURE) ×1 IMPLANT
SUT VIC AB 0 CT1 8-18 (SUTURE) ×1
SUT VIC AB 2-0 CT1 18 (SUTURE) ×1 IMPLANT
SYR BULB IRRIG 60ML STRL (SYRINGE) ×1 IMPLANT
SYR CONTROL 10ML LL (SYRINGE) ×2 IMPLANT
TOWEL GREEN STERILE (TOWEL DISPOSABLE) ×1 IMPLANT
TOWEL GREEN STERILE FF (TOWEL DISPOSABLE) ×1 IMPLANT
TUBING FEATHERFLOW (TUBING) ×2 IMPLANT
WATER STERILE IRR 1000ML POUR (IV SOLUTION) ×1 IMPLANT

## 2022-11-19 NOTE — Transfer of Care (Signed)
Immediate Anesthesia Transfer of Care Note  Patient: Ryan [name]  Procedure(s) Performed: L4-5 LAMINECTOMY WITH LEFT SIDED FORAMINOTOMY  Patient Location: PACU  Anesthesia Type:General  Level of Consciousness: awake, alert , patient cooperative, and responds to stimulation  Airway & Oxygen Therapy: Patient Spontanous Breathing and Patient connected to face mask oxygen  Post-op Assessment: Report given to RN, Post -op Vital signs reviewed and stable, and Patient moving all extremities X 4  Post vital signs: Reviewed and stable  Last Vitals:  Vitals Value Taken Time  BP 124/79 11/19/22 1123  Temp    Pulse 91 11/19/22 1126  Resp 22 11/19/22 1126  SpO2 97 % 11/19/22 1126  Vitals shown include unfiled device data.  Last Pain:  Vitals:   11/19/22 0558  TempSrc:   PainSc: 0-No pain         Complications: No notable events documented.

## 2022-11-19 NOTE — Discharge Summary (Signed)
Orthopedic Surgery Discharge Summary  Patient name: Ryan [name] Patient MRN: [id number] Admit today: 11/19/2022 Discharge date: 11/20/2022  Attending physician: Willia Craze, MD Final diagnosis: lumbar stenosis with neurogenic claudication Findings: L4/5 hypertrophic facets and hypertrophied ligamentum flavum  Hospital course: Patient is a 77 y.o. male who was admitted after undergoing L4, L5 laminectomy with left sided foraminotomy. The patient had significant pain immediately after surgery, but pain eventually was controlled with a multimodal regimen including oxycodone. The patient worked with physical therapy who recommended discharge to home. The patient was tolerating an oral diet without issue and was voiding spontaneously after surgery. The patient's vitals were stable on the day of discharge. The patient was medically ready for discharge and was discharge to home on post-operative day one.  Instructions:   Orthopedic Surgery Discharge Instructions  Patient name: Ryan [name] Procedure Performed: L4/5 laminectomy and left sided foraminotomy Date of Surgery: 11/19/2022 Surgeon: Willia Craze, MD  Pre-operative Diagnosis: lumbar stenosis Post-operative Diagnosis: same as above  Discharge Date: 11/20/2022 Discharged to: home  Discharge Condition: stable  Activity: You should refrain from bending, lifting, or twisting with objects greater than ten pounds until three months after surgery. You are encouraged to walk as much as desired. You can perform household activities such as cleaning dishes, doing laundry, vacuuming, etc. as long as the ten-pound restriction is followed. You do not need to wear a brace during the post-operative period.   Incision Care: Your incision site has a dressing over it. That dressing should remain in place and dry at all times for a total of one week after surgery. After one week, you can remove the dressing. Underneath the dressing, you will find  pieces of tape. You should leave these pieces of tape in place. They will fall off with time. Do not pick, rub, or scrub at them. Do not put cream or lotion over the surgical area. After one week and once the dressing is off, it is okay to let soap and water run over your incision. Again, do not pick, scrub, or rub at the pieces of tape when bathing. Do not submerge (e.g., take a bath, swim, go in a hot tub, etc.) until six weeks after surgery. There may be some bloody drainage from the incision into the dressing after surgery. This is normal. You do not need to replace the dressing. Continue to leave it in place for the one week as instructed above. Should the dressing become saturated with blood or drainage, please call the office for further instructions.   Medications: You have been prescribed oxycodone. This is a narcotic pain medication and should only be taken as prescribed. You should not drink alcohol or operate heavy machinery (including driving) while taking this medication. The oxycodone can cause constipation as a side effect. For that reason, you have been prescribed senna and miralax. These are both laxatives. You do not need to take this medication if you develop diarrhea. Should you remain constipated even while taking these medications, please increase the dose of miralax to twice daily. Tylenol has been prescribed to be taken every 8 hours, which will give you additional pain relief. Robaxin is a muscle relaxer that has been prescribed to you for muscle spasm type pain. Take this medication as needed.   You can use over-the-counter NSAIDs (ibuprofen, Aleve, Celebrex, naproxen, meloxicam, etc.) for additional pain relief after this surgery. These medications are safe to take with the Tylenol you have been prescribed. You should not take  these medications if you have or have had kidney problems or gastrointestinal ulcers. Take these medications as instructed on the packaging.   In order to set  expectations for opioid prescriptions, you will only be prescribed opioids for a total of six weeks after surgery and, at two-weeks after surgery, your opioid prescription will start to tapered (decreased dosage and number of pills). If you have ongoing need for opioid medication six weeks after surgery, you will be referred to pain management. If you are already established with a provider that is giving you opioid medications, you should schedule an appointment with them for six weeks after surgery if you feel you are going to need another prescription. State law only allows for opioid prescriptions one week at a time. If you are running out of opioid medication near the end of the week, please call the office during business hours before running out so I can send you another prescription.   You may resume any home blood thinners (warfarin, lovenox, apixaban, plavix, xarelto, etc) 72 hours after your surgery. Take these medications as they were previously prescribed.  Driving: You should not drive while taking narcotic pain medications. You should start getting back to driving slowly and you may want to try driving in a parking lot before doing anything more.   Diet: You are safe to resume your regular diet after surgery.   Reasons to Call the Office After Surgery: You should feel free to call the office with any concerns or questions you have in the post-operative period, but you should definitely notify the office if you develop: -shortness of breath, chest pain, or trouble breathing -excessive bleeding, drainage, redness, or swelling around the surgical site -fevers, chills, or pain that is getting worse with each passing day -persistent nausea or vomiting -new weakness in either leg -new or worsening numbness or tingling in either leg -numbness in the groin, bowel or bladder incontinence -other concerns about your surgery  Follow Up Appointments: You should have an office appointment  scheduled for approximately two weeks after surgery. If you do not remember when this appointment is or do not already have it scheduled, please call the office to schedule.   Office Information:  -Willia Craze, MD -Phone number: [phone] -Address: [address]       [place], [place] [address]

## 2022-11-19 NOTE — Op Note (Signed)
Orthopedic Spine Surgery Operative Report  Procedure: L4, L5 lumbar laminectomy and left sided foraminotomy  Modifier: none  Date of procedure: 11/19/2022  Patient name: Ryan [name] MRN: [id number] DOB: 12-19-1945  Surgeon: Willia Craze, MD Assistant: none Pre-operative diagnosis: lumbar stenosis with neurogenic claudication Post-operative diagnosis: same as above Findings: L4/5 hypertrophic facets and thickened ligamentum flavum  Specimens: none Anesthesia: general EBL: 75cc Complications: none Pre-incision antibiotic: ancef TXA was given prior to incision as well  Implants: none   Indication for procedure: Patient is a 77 y.o. male who presented to the office with symptoms consistent with lumbar stenosis and neurogenic claudication. The patient had tried conservative treatments that did not provide any lasting relief. As result, operative management was discussed. L4 and L5 segment laminectomies with left sided foraminotomies were presented as a treatment option. The risks including but not limited to iatrogenic instability, dural tear, nerve root injury, paralysis, persistent pain, infection, bleeding, heart attack, death, stroke, fracture, blindness, and need for additional procedures were discussed with the patient. The benefit of the surgery would be relief of the patient's symptoms related to the lumbar stenosis. The alternatives to surgical management were covered with the patient and included continued monitoring, physical therapy, over-the-counter pain medications, ambulatory aids, injections, and activity modification. All the patient's questions were answered to his satisfaction. After this discussion, the patient expressed understanding and elected to proceed with surgical intervention.   Procedure Description: The patient was met in the pre-operative holding area. The patient's identity and consent were verified. The operative site was marked. The patient's remaining  questions about the surgery were answered. The patient was brought back to the operating room. General anesthesia was induced and an endotracheal tube was placed by the anesthesia staff. The patient was transferred to the prone [place] table in the prone position. All bony prominences were well padded. The head of the bed was slightly elevated and the eyes were free from compression by the face pillow. The surgical area was cleansed with alcohol. Fluoroscopy was then brought in to check rotation on the AP image and to mark the levels on the lateral image. The patient's skin was then prepped and draped in a standard, sterile fashion. A time out was performed that identified the patient, the procedure, and the operative levels. All team members agreed with what was stated in the time out.   A midline incision over the spinous processes of the previously marked levels was made and sharp dissection was continued down through the skin and dermis. Electrocautery was then used to continue the midline dissection down to the level of the spinous process. Subperiosteal dissection was performed using electrocautery to expose the lamina out lateral to the facet joint capsule. Care was taken to not violate the facet joint capsules. A lateral fluoroscopic image was taken to confirm the level. Subperiosteal dissection with electrocautery was then done to expose the pars of L4, the lamina of L4, and the superior lamina of L5. Again, care was taken to avoid disruption of the facet capsules.    A rongeur was used to remove the spinous processes and interspinous ligaments between the L3/4 interspinous ligament to the cranial portion of the L5 spinous process. Bone wax was used to obtain hemostasis at the bleeding bony surfaces. A high-speed burr was used to thin the lamina at all planned laminectomy levels to the level of the ligamentum flavum. Above the level of the ligamentum, the lamina was thinned with the burr to the  approximate  level of the ligamentum. Care was taken to leave at least 8mm of pars interarticularis on each side. A series of Kerrison rongeurs were used to remove the remaining lamina and ligamentum overlying the thecal sac. On the left side, a woodsen was used to protect the dura as the medial aspect of the facet was removed with a kerrison. The woodsen was then placed into the L4/5 foramen on the left side to protect the nerve root as a kerrison was used to remove the bone and soft tissue overlying the nerve root.  A woodsen was placed into the laminectomy site to palpate for any remaining areas of stenosis. No furthers of stenosis were palpated with the woodsen. The woodsen was able to be easily passed into the left L4/5 foramen.   The wound was copiously irrigated with sterile saline. Hemostasis was achieved with gel foam and thrombin. The fascia was reapproximated with 0 vicryl suture. The subcutaneous fat was reapproximated with 0 vicryl suture. The deep dermal layer was reapproximated with 2-0 viryl. The skin as closed with a 3-0 running moncryl. All counts were correct at the end of the case. The incision was dressed with steri strips and benzoine. An island dressing was placed over the wound. The patient was transferred back to a bed and brought to the post-anesthesia care unit by anesthesia staff in stable condition.  Post-operative plan: The patient will recover in the post-anesthesia care unit and then go to the floor. The patient will receive two post-operative doses of ancef. The patient will be out of bed as tolerated with no brace. The patient will work with physical therapy. The patient will likely go home tomorrow afternoon.   Willia Craze, MD Orthopedic Surgeon

## 2022-11-19 NOTE — Anesthesia Procedure Notes (Signed)
Procedure Name: Intubation Date/Time: 11/19/2022 7:44 AM  Performed by: Shary Decamp, CRNAPre-anesthesia Checklist: Patient identified, Patient being monitored, Timeout performed, Emergency Drugs available and Suction available Patient Re-evaluated:Patient Re-evaluated prior to induction Oxygen Delivery Method: Circle system utilized Preoxygenation: Pre-oxygenation with 100% oxygen Induction Type: IV induction Ventilation: Mask ventilation without difficulty Laryngoscope Size: Mac and 4 Grade View: Grade I Tube type: Oral Tube size: 8.0 mm Number of attempts: 1 Airway Equipment and Method: Rigid stylet and Lighted stylet Placement Confirmation: ETT inserted through vocal cords under direct vision, positive ETCO2 and breath sounds checked- equal and bilateral Secured at: 22 cm Tube secured with: Tape Dental Injury: Teeth and Oropharynx as per pre-operative assessment  Difficulty Due To: Difficult Airway- due to limited oral opening and Difficult Airway- due to large tongue

## 2022-11-19 NOTE — Progress Notes (Signed)
Orthopedic Surgery Post-operative Progress Note  Assessment: Patient is a 77 y.o. male who is currently admitted after undergoing L4/5 laminectomy and left-sided foraminotomy   Plan: -Operative plans complete -Drains: none -Out of bed as tolerated, no brace -No bending/lifting/twisting greater than 10 pounds -OT evaluate and treat -Pain control -Regular diet -No chemoprophylaxis for dvt or antiplatelets for 72 hours after surgery -Ancef x2 post-operative doses -Disposition: to floor from PACU  Hypertension -continue home ARB and amlodipine  Hypothyroidism -continue home levothyroxine  HLD -continue home crestor  BPH  -daily flomax ___________________________________________________________________________   Subjective: No acute events since surgery. Recovering in PACU. Having back pain. No leg pain. Denies paresthesias and numbness. Pain controlled at this time.   Objective:  General: no acute distress, appropriate affect Neurologic: alert, answering questions appropriately, following commands Respiratory: unlabored breathing on room air Skin: dressing clear/dry/intact  MSK (spine):  -Strength exam      Right  Left  EHL    5/5  5/5 TA    5/5  5/5 GSC    5/5  5/5 Knee extension  5/5  5/5 Hip flexion   5/5  5/5  -Sensory exam    Sensation intact to light touch in L3-S1 nerve distributions of bilateral lower extremities   Patient name: Ryan [name] Patient MRN: [id number] Date: 11/19/22

## 2022-11-19 NOTE — Anesthesia Postprocedure Evaluation (Signed)
Anesthesia Post Note  Patient: Ryan [name]  Procedure(s) Performed: L4-5 LAMINECTOMY WITH LEFT SIDED FORAMINOTOMY     Patient location during evaluation: PACU Anesthesia Type: General Level of consciousness: awake and alert Pain management: pain level controlled Vital Signs Assessment: post-procedure vital signs reviewed and stable Respiratory status: spontaneous breathing, nonlabored ventilation, respiratory function stable and patient connected to nasal cannula oxygen Cardiovascular status: blood pressure returned to baseline and stable Postop Assessment: no apparent nausea or vomiting Anesthetic complications: no  No notable events documented.  Last Vitals:  Vitals:   11/19/22 1230 11/19/22 1334  BP: 134/83 127/77  Pulse: 77 86  Resp: 15 18  Temp: 36.6 C 36.6 C  SpO2: 100% 97%    Last Pain:  Vitals:   11/19/22 1334  TempSrc:   PainSc: 4                  Kennieth Rad

## 2022-11-19 NOTE — Discharge Instructions (Signed)
Orthopedic Surgery Discharge Instructions  Patient name: Ryan [name] Procedure Performed: L4/5 laminectomy and left sided foraminotomy Date of Surgery: 11/19/2022 Surgeon: Willia Craze, MD  Pre-operative Diagnosis: lumbar stenosis Post-operative Diagnosis: same as above  Discharge Date: 11/20/2022 Discharged to: home  Discharge Condition: stable  Activity: You should refrain from bending, lifting, or twisting with objects greater than ten pounds until three months after surgery. You are encouraged to walk as much as desired. You can perform household activities such as cleaning dishes, doing laundry, vacuuming, etc. as long as the ten-pound restriction is followed. You do not need to wear a brace during the post-operative period.   Incision Care: Your incision site has a dressing over it. That dressing should remain in place and dry at all times for a total of one week after surgery. After one week, you can remove the dressing. Underneath the dressing, you will find pieces of tape. You should leave these pieces of tape in place. They will fall off with time. Do not pick, rub, or scrub at them. Do not put cream or lotion over the surgical area. After one week and once the dressing is off, it is okay to let soap and water run over your incision. Again, do not pick, scrub, or rub at the pieces of tape when bathing. Do not submerge (e.g., take a bath, swim, go in a hot tub, etc.) until six weeks after surgery. There may be some bloody drainage from the incision into the dressing after surgery. This is normal. You do not need to replace the dressing. Continue to leave it in place for the one week as instructed above. Should the dressing become saturated with blood or drainage, please call the office for further instructions.   Medications: You have been prescribed oxycodone. This is a narcotic pain medication and should only be taken as prescribed. You should not drink alcohol or operate heavy  machinery (including driving) while taking this medication. The oxycodone can cause constipation as a side effect. For that reason, you have been prescribed senna and miralax. These are both laxatives. You do not need to take this medication if you develop diarrhea. Should you remain constipated even while taking these medications, please increase the dose of miralax to twice daily. Tylenol has been prescribed to be taken every 8 hours, which will give you additional pain relief. Robaxin is a muscle relaxer that has been prescribed to you for muscle spasm type pain. Take this medication as needed.   You can use over-the-counter NSAIDs (ibuprofen, Aleve, Celebrex, naproxen, meloxicam, etc.) for additional pain relief after this surgery. These medications are safe to take with the Tylenol you have been prescribed. You should not take these medications if you have or have had kidney problems or gastrointestinal ulcers. Take these medications as instructed on the packaging.   In order to set expectations for opioid prescriptions, you will only be prescribed opioids for a total of six weeks after surgery and, at two-weeks after surgery, your opioid prescription will start to tapered (decreased dosage and number of pills). If you have ongoing need for opioid medication six weeks after surgery, you will be referred to pain management. If you are already established with a provider that is giving you opioid medications, you should schedule an appointment with them for six weeks after surgery if you feel you are going to need another prescription. State law only allows for opioid prescriptions one week at a time. If you are running out of  opioid medication near the end of the week, please call the office during business hours before running out so I can send you another prescription.   You may resume any home blood thinners (warfarin, lovenox, apixaban, plavix, xarelto, etc) 72 hours after your surgery. Take these  medications as they were previously prescribed.  Driving: You should not drive while taking narcotic pain medications. You should start getting back to driving slowly and you may want to try driving in a parking lot before doing anything more.   Diet: You are safe to resume your regular diet after surgery.   Reasons to Call the Office After Surgery: You should feel free to call the office with any concerns or questions you have in the post-operative period, but you should definitely notify the office if you develop: -shortness of breath, chest pain, or trouble breathing -excessive bleeding, drainage, redness, or swelling around the surgical site -fevers, chills, or pain that is getting worse with each passing day -persistent nausea or vomiting -new weakness in either leg -new or worsening numbness or tingling in either leg -numbness in the groin, bowel or bladder incontinence -other concerns about your surgery  Follow Up Appointments: You should have an office appointment scheduled for approximately two weeks after surgery. If you do not remember when this appointment is or do not already have it scheduled, please call the office to schedule.   Office Information:  -Willia Craze, MD -Phone number: [phone] -Address: [address]       [place], [place] [address]

## 2022-11-20 ENCOUNTER — Encounter (HOSPITAL_COMMUNITY): Payer: Self-pay | Admitting: Orthopedic Surgery

## 2022-11-20 DIAGNOSIS — M48062 Spinal stenosis, lumbar region with neurogenic claudication: Secondary | ICD-10-CM | POA: Diagnosis not present

## 2022-11-20 NOTE — Plan of Care (Signed)
Pt doing well. Pt and wife given D/C instructions with verbal understanding. Rx's were sent to the pharmacy by MD. Pt's incision is clean and dry with no sign of infection. Pt's IV was removed prior to D/C. Pt D/C'd home via wheelchair per MD order. Pt is stable @ D/C and has no other needs at this time. Ashley Allred, RN  

## 2022-11-20 NOTE — Progress Notes (Signed)
Orthopedic Surgery Post-operative Progress Note  Assessment: Patient is a 77 y.o. male who is currently admitted after undergoing L4/5 laminectomy and left-sided foraminotomy   Plan: -Operative plans complete -Drains: none -Out of bed as tolerated, no brace -No bending/lifting/twisting greater than 10 pounds -OT evaluate and treat -Pain control -Regular diet -No chemoprophylaxis for dvt or antiplatelets for 72 hours after surgery -Ancef x2 post-operative doses -Anticipate discharge to home today  Hypertension -continue home ARB and amlodipine  Hypothyroidism -continue home levothyroxine  HLD -continue home crestor  BPH  -daily flomax ___________________________________________________________________________   Subjective: No acute events overnight. Pain well controlled. Not having any leg pain. Denies paresthesias and numbness.    Objective:  General: no acute distress, appropriate affect, sitting up in chair in street clothes Neurologic: alert, answering questions appropriately, following commands Respiratory: unlabored breathing on room air Skin: dressing clear/dry/intact  MSK (spine):  -Strength exam      Right  Left  EHL    5/5  5/5 TA    5/5  5/5 GSC    5/5  5/5 Knee extension  5/5  5/5 Hip flexion   5/5  5/5  -Sensory exam    Sensation intact to light touch in L3-S1 nerve distributions of bilateral lower extremities   Patient name: Ryan [name] Patient MRN: [id number] Date: 11/20/22

## 2022-11-20 NOTE — Evaluation (Signed)
Occupational Therapy Evaluation Patient Details Name: Ryan [name] MRN: [id number] DOB: 10-Mar-1946 Today's Date: 11/20/2022   History of Present Illness Pt is a 77 y.o. male s/p L4-5 laminectomy with L sided foraminotomy. PMH significant for osteoarthritis, HTN, hypothyroidism, HLD, ACDF, L THA, Bil TKA, L elbow ORIF.   Clinical Impression   PTA, pt lived with wife who assisted with donning socks. Upon eval, pt performing UB ADL with set-up and LB ADL with up to mod A. Pt educated and demonstrating use of compensatory techniques for bed mobility, LB ADL, grooming, toileting, shower transfers, stair training within precautions. Increased time and CGA needed for stair training. Pt denying need for any home health services and reporting he did just as well with stairs today as he typically does. Wife confident with providing any needed assist at home. Both denying need for [facility] despite toilet upstairs. Pt does have urinal. All education provided and questions answered. Recommending discharge home with no OT follow up. OT to sign off. Please re-consult if change in status.          If plan is discharge home, recommend the following: A little help with walking and/or transfers;A little help with bathing/dressing/bathroom;Assistance with cooking/housework;Help with stairs or ramp for entrance;Assist for transportation    Functional Status Assessment  Patient has had a recent decline in their functional status and demonstrates the ability to make significant improvements in function in a reasonable and predictable amount of time.  Equipment Recommendations  Other (comment) (Recommended BSC to pt but pt denies need and wife reports she can obtain one if needed)    Recommendations for Other Services PT consult     Precautions / Restrictions Precautions Precautions: Back Precaution Booklet Issued: Yes (comment) Precaution Comments: All precautions reviewed during ADL Required Braces or Orthoses:   (no brace) Restrictions Weight Bearing Restrictions: No      Mobility Bed Mobility Overal bed mobility: Needs Assistance Bed Mobility: Rolling, Sidelying to Sit Rolling: Supervision Sidelying to sit: Supervision            Transfers Overall transfer level: Needs assistance   Transfers: Sit to/from Stand Sit to Stand: Supervision           General transfer comment: for STS; CGA for functional ambulation      Balance Overall balance assessment: Needs assistance Sitting-balance support: No upper extremity supported, Feet supported Sitting balance-Leahy Scale: Good     Standing balance support: No upper extremity supported, During functional activity Standing balance-Leahy Scale: Fair Standing balance comment: CGA. Encouraged use of his cane in home setting as needed.                           ADL either performed or assessed with clinical judgement   ADL Overall ADL's : Needs assistance/impaired Eating/Feeding: Independent   Grooming: Supervision/safety;Standing   Upper Body Bathing: Set up;Sitting   Lower Body Bathing: Moderate assistance;Sit to/from stand   Upper Body Dressing : Set up;Sitting   Lower Body Dressing: Moderate assistance;Sit to/from stand   Toilet Transfer: Contact guard assist   Toileting- Clothing Manipulation and Hygiene: Contact guard assist   Tub/ Shower Transfer: Walk-in shower;Contact guard assist;Ambulation   Functional mobility during ADLs: Contact guard assist General ADL Comments: Pt performing ADL within precautiuons during session. Practiced stair training with contact guard A but observed with decreased neuromuscular endurance, requiring rest break and increased time to negotiate stairs. Pt aware of this and wife reporting she  is comfortable with assisting at home. pt really only needing to go upstairs for toileting as he can sleep in recliner on main level. Denied need for [facility], but wife able to verbalize how they  can obtain one if needed.     Vision Ability to See in Adequate Light: 0 Adequate Patient Visual Report: No change from baseline Vision Assessment?: No apparent visual deficits     Perception Perception: Not tested       Praxis Praxis: Not tested       Pertinent Vitals/Pain Pain Assessment Pain Assessment: Faces Faces Pain Scale: Hurts little more Pain Location: back Pain Descriptors / Indicators: Aching, Guarding, Operative site guarding Pain Intervention(s): Limited activity within patient's tolerance, Monitored during session     Extremity/Trunk Assessment Upper Extremity Assessment Upper Extremity Assessment: Overall WFL for tasks assessed   Lower Extremity Assessment Lower Extremity Assessment: Generalized weakness   Cervical / Trunk Assessment Cervical / Trunk Assessment: Back Surgery   Communication Communication Communication: No apparent difficulties   Cognition Arousal: Alert Behavior During Therapy: WFL for tasks assessed/performed Overall Cognitive Status: Within Functional Limits for tasks assessed                                 General Comments: Some decreased safety awareness and realistic expectations. Reports his solution to not needing AD is to have surgery to "fix" whatever is going on. Denies need for [facility] despite effortful (CGA only) getting up stairs and no restroom on entry level of home.     General Comments  VSS. Wife present and supportive    Exercises     Shoulder Instructions      Home Living Family/patient expects to be discharged to:: Private residence Living Arrangements: Spouse/significant other Available Help at Discharge: Family;Available 24 hours/day (wife) Type of Home: House Home Access: Stairs to enter [employer] of Steps: 1 Entrance Stairs-Rails: Right ("handicapped bar") Home Layout: Multi-level Alternate Level Stairs-Number of Steps: 6 Alternate Level Stairs-Rails: Left  (ascending) Bathroom Shower/Tub: Walk-in Human resources officer: Handicapped height     Home Equipment: Cane - single point          Prior Functioning/Environment Prior Level of Function : Needs assist;Driving             Mobility Comments: intermittent cane use ADLs Comments: Wife assists with socks.        OT Problem List: Decreased strength;Decreased activity tolerance;Impaired balance (sitting and/or standing);Pain;Decreased knowledge of precautions      OT Treatment/Interventions:      OT Goals(Current goals can be found in the care plan section) Acute Rehab OT Goals Patient Stated Goal: go home OT Goal Formulation: With patient  OT Frequency:      Co-evaluation              AM-PAC OT "6 Clicks" Daily Activity     Outcome Measure Help from another person eating meals?: None Help from another person taking care of personal grooming?: A Little Help from another person toileting, which includes using toliet, bedpan, or urinal?: A Little Help from another person bathing (including washing, rinsing, drying)?: A Little Help from another person to put on and taking off regular upper body clothing?: A Little Help from another person to put on and taking off regular lower body clothing?: A Lot 6 Click Score: 18   End of Session Equipment Utilized During Treatment: Gait belt Nurse Communication: Mobility  status  Activity Tolerance: Patient tolerated treatment well Patient left: in chair;with call bell/phone within reach;with family/visitor present  OT Visit Diagnosis: Unsteadiness on feet (R26.81);Muscle weakness (generalized) (M62.81);Other abnormalities of gait and mobility (R26.89);Pain Pain - part of body:  (back)                Time: 1610-9604 OT Time Calculation (min): 24 min Charges:  OT General Charges $OT Visit: 1 Visit OT Evaluation $OT Eval Low Complexity: 1 Low OT Treatments $Self Care/Home Management : 8-22 mins  Tyler Deis,  OTR/L [facility] Acute Rehabilitation Office: [phone]   Myrla Halsted 11/20/2022, 9:01 AM

## 2022-11-21 ENCOUNTER — Ambulatory Visit: Payer: PPO | Admitting: Orthopedic Surgery

## 2022-11-23 DIAGNOSIS — I129 Hypertensive chronic kidney disease with stage 1 through stage 4 chronic kidney disease, or unspecified chronic kidney disease: Secondary | ICD-10-CM | POA: Diagnosis not present

## 2022-11-23 DIAGNOSIS — E782 Mixed hyperlipidemia: Secondary | ICD-10-CM | POA: Diagnosis not present

## 2022-11-23 DIAGNOSIS — N183 Chronic kidney disease, stage 3 unspecified: Secondary | ICD-10-CM | POA: Diagnosis not present

## 2022-12-02 ENCOUNTER — Ambulatory Visit: Payer: PPO | Admitting: Orthopedic Surgery

## 2022-12-02 DIAGNOSIS — Z9889 Other specified postprocedural states: Secondary | ICD-10-CM

## 2022-12-02 NOTE — Progress Notes (Signed)
Orthopedic Surgery Post-operative Office Visit  Procedure: L4/5 laminectomy and left-sided foraminotomy Date of Surgery: 11/19/2022  Assessment: Patient is a 77 y.o. who is doing well after surgery   Plan: -Operative plans complete -Out of bed as tolerated, no brace -Okay to let soap/water run over incision, do not submerge -No bending/lifting/twisting greater than 10 pounds -Pain management: tylenol as needed -Return to office in 4 weeks, lumbar x-rays needed at next visit: none  ___________________________________________________________________________   Subjective: Patient has been doing well since surgery.  He is at home.  He is not having any pain radiating into either leg.  He took Tylenol for the first couple days after surgery but is no longer taking any medication for pain.  Denies paresthesias and numbness.  Has not noticed any redness or drainage around his incision.  Objective:  General: no acute distress, appropriate affect Neurologic: alert, answering questions appropriately, following commands Respiratory: unlabored breathing on room air Skin: incision is well-approximated with no erythema, induration, active/expressible drainage  MSK (spine):  -Strength exam      Left  Right  EHL    5/5  5/5 TA    5/5  5/5 GSC    5/5  5/5 Knee extension  5/5  5/5 Hip flexion   5/5  5/5  -Sensory exam    Sensation intact to light touch in L3-S1 nerve distributions of bilateral lower extremities  Imaging: None obtained today   Patient name: Ryan [name] Patient MRN: [id number] Date of visit: 12/02/22

## 2022-12-18 ENCOUNTER — Telehealth: Payer: Self-pay | Admitting: Orthopedic Surgery

## 2022-12-18 NOTE — Telephone Encounter (Signed)
Patient called at 1:52 pm left voicemail stating he wants to make appt with Dr Romeo Apple.  I called the patient back at 2:05 pm  LVM we are returning his call.

## 2022-12-30 ENCOUNTER — Other Ambulatory Visit (INDEPENDENT_AMBULATORY_CARE_PROVIDER_SITE_OTHER): Payer: PPO

## 2022-12-30 ENCOUNTER — Encounter: Payer: Self-pay | Admitting: Orthopedic Surgery

## 2022-12-30 ENCOUNTER — Ambulatory Visit: Payer: PPO | Admitting: Orthopedic Surgery

## 2022-12-30 VITALS — BP 143/75 | HR 70 | Ht 68.0 in | Wt 200.0 lb

## 2022-12-30 DIAGNOSIS — M1610 Unilateral primary osteoarthritis, unspecified hip: Secondary | ICD-10-CM

## 2022-12-30 DIAGNOSIS — Z96642 Presence of left artificial hip joint: Secondary | ICD-10-CM | POA: Diagnosis not present
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
## 2023-01-02 ENCOUNTER — Encounter: Payer: PPO | Admitting: Orthopedic Surgery

## 2023-01-06 DIAGNOSIS — H04123 Dry eye syndrome of bilateral lacrimal glands: Secondary | ICD-10-CM | POA: Diagnosis not present

## 2023-01-23 ENCOUNTER — Ambulatory Visit: Payer: PPO | Admitting: Orthopedic Surgery

## 2023-01-23 DIAGNOSIS — Z9889 Other specified postprocedural states: Secondary | ICD-10-CM

## 2023-01-23 NOTE — Progress Notes (Signed)
Orthopedic Surgery Post-operative Office Visit   Procedure: L4/5 laminectomy and left-sided foraminotomy Date of Surgery: 11/19/2022 (~8 weeks)   Assessment: Patient is a 77 y.o. who is doing well after surgery     Plan: -Operative plans complete -No spine specific precautions -Okay to submerge the wound at this time -Pain management: tylenol as needed -Return to office in 6 weeks, lumbar x-rays needed at next visit: [facility]/lateral/flex/ex lumbar   ___________________________________________________________________________     Subjective: Patient is still doing well after surgery.  He is not having any radiating leg pain.  He said he is gotten back to most of his activities except doing any heavy lifting.  He has been following the 10 pound lifting restriction.  He sometimes will have back pain if he sits for more than 2 hours but as soon as he gets up it goes away.  He is pleased with his surgical outcome so far.   Objective:   General: no acute distress, appropriate affect Neurologic: alert, answering questions appropriately, following commands Respiratory: unlabored breathing on room air Skin: incision is well healed with no erythema, induration, active/expressible drainage   MSK (spine):   -Strength exam                                                   Left                  Right   EHL                              5/5                  5/5 TA                                 5/5                  5/5 GSC                             5/5                  5/5 Knee extension            5/5                  5/5 Hip flexion                    5/5                  5/5   -Sensory exam                           Sensation intact to light touch in L3-S1 nerve distributions of bilateral lower extremities   Imaging: None obtained today     Patient name: Ryan [name] Patient MRN: [id number] Date of visit: 01/23/23

## 2023-03-06 ENCOUNTER — Ambulatory Visit: Payer: PPO | Admitting: Orthopedic Surgery

## 2023-03-06 ENCOUNTER — Other Ambulatory Visit (INDEPENDENT_AMBULATORY_CARE_PROVIDER_SITE_OTHER): Payer: PPO

## 2023-03-06 DIAGNOSIS — M5416 Radiculopathy, lumbar region: Secondary | ICD-10-CM | POA: Diagnosis not present

## 2023-03-06 DIAGNOSIS — M545 Low back pain, unspecified: Secondary | ICD-10-CM | POA: Diagnosis not present

## 2023-03-06 NOTE — Progress Notes (Signed)
Orthopedic Surgery Post-operative Office Visit   Procedure: L4/5 laminectomy and left-sided foraminotomy Date of Surgery: 11/19/2022 (~15 weeks)   Assessment: Patient is a 77 y.o. who is doing well after surgery     Plan: -Operative plans complete -No spine specific precautions -Return to office on an as needed basis   ___________________________________________________________________________     Subjective: Patient has been doing well since surgery.  He has not noticed any pain radiating into his legs.  He has returned to full activity and has not noticed any pain or has not had to limit himself in any way.  He is happy with his surgical outcome at this point.   Objective:   General: no acute distress, appropriate affect Neurologic: alert, answering questions appropriately, following commands Respiratory: unlabored breathing on room air Skin: incision is well healed with no erythema, induration, active/expressible drainage   MSK (spine):   -Strength exam                                                   Left                  Right   EHL                              5/5                  5/5 TA                                 5/5                  5/5 GSC                             5/5                  5/5 Knee extension            5/5                  5/5 Hip flexion                    5/5                  5/5   -Sensory exam                           Sensation intact to light touch in L3-S1 nerve distributions of bilateral lower extremities   Imaging: XRs of the lumbar spine from 03/06/2023 were dependently reviewed interpreted, showing disc height loss at multiple levels but it is most significant at L3/4 and L5/S1. Laminectomy defect at L4/5. No fracture or dislocation seen. No evidence of instability on flexion/extension views.      Patient name: Ryan [name] Patient MRN: [id number] Date of visit: 03/06/23

## 2023-03-25 DIAGNOSIS — I10 Essential (primary) hypertension: Secondary | ICD-10-CM | POA: Diagnosis not present

## 2023-03-25 DIAGNOSIS — I129 Hypertensive chronic kidney disease with stage 1 through stage 4 chronic kidney disease, or unspecified chronic kidney disease: Secondary | ICD-10-CM | POA: Diagnosis not present

## 2023-03-25 DIAGNOSIS — E782 Mixed hyperlipidemia: Secondary | ICD-10-CM | POA: Diagnosis not present

## 2023-03-25 DIAGNOSIS — N183 Chronic kidney disease, stage 3 unspecified: Secondary | ICD-10-CM | POA: Diagnosis not present

## 2023-04-15 ENCOUNTER — Encounter: Payer: Self-pay | Admitting: Cardiology

## 2023-04-15 ENCOUNTER — Ambulatory Visit: Payer: PPO | Attending: Cardiology | Admitting: Cardiology

## 2023-04-15 VITALS — BP 126/78 | HR 65 | Ht 68.5 in | Wt 198.6 lb

## 2023-04-15 DIAGNOSIS — I5022 Chronic systolic (congestive) heart failure: Secondary | ICD-10-CM | POA: Diagnosis not present

## 2023-04-15 DIAGNOSIS — I10 Essential (primary) hypertension: Secondary | ICD-10-CM | POA: Diagnosis not present

## 2023-04-15 DIAGNOSIS — R011 Cardiac murmur, unspecified: Secondary | ICD-10-CM

## 2023-04-15 NOTE — Progress Notes (Signed)
    Cardiology Office Note  Date: 04/15/2023   ID: Ryan [name], DOB [birth year], MRN [id number]  History of Present Illness: Ryan [name] is a 78 y.o. male last seen in January 2024.  He is here with his wife for a follow-up visit.  Reports no exertional chest pain, stable NYHA class II dyspnea, no palpitations or syncope.  He did undergo lumbar spine surgery in August of last year per chart review.  I reviewed his medications.  Current regimen includes Norvasc, Benicar, and Crestor.  Blood pressure today is well-controlled.  I reviewed his ECG today which shows normal sinus rhythm with left anterior fascicular block.  We discussed getting an updated echocardiogram in comparison to assessment in 2022.  Physical Exam: VS:  BP 126/78   Pulse 65   Ht 5' 8.5" (1.74 m)   Wt 198 lb 9.6 oz (90.1 kg)   SpO2 96%   BMI 29.76 kg/m , BMI Body mass index is 29.76 kg/m.  Wt Readings from Last 3 Encounters:  04/15/23 198 lb 9.6 oz (90.1 kg)  12/30/22 200 lb (90.7 kg)  11/19/22 200 lb (90.7 kg)    General: Patient appears comfortable at rest. HEENT: Conjunctiva and lids normal. Neck: Supple, no elevated JVP or carotid bruits. Lungs: Clear to auscultation, nonlabored breathing at rest. Cardiac: Regular rate and rhythm, no S3, 2/6 systolic murmur, no pericardial rub. Extremities: No pitting edema.  ECG:  An ECG dated 02/12/2022 was personally reviewed today and demonstrated:  Sinus rhythm with PAC, right bundle branch block, left anterior fascicular block.  Labwork: May 2024: Cholesterol 134, triglycerides 104, HDL 45, LDL 70, TSH 2.74 11/12/2022: BUN 11; Creatinine, Ser 1.17; Hemoglobin 13.4; Platelets 300; Potassium 4.3; Sodium 139   Other Studies Reviewed Today:  No interval cardiac testing for review today.  Assessment and Plan:  1.  Primary hypertension.  Blood pressure control adequate today, plan to continue Benicar and Norvasc.  Keep follow-up with PCP.   2.  HFmrEF by prior  evaluation, LVEF 45 to 50% by echocardiogram, 51% by Myoview.  Will update echocardiogram.  3.  Mixed hyperlipidemia, he continues on Crestor.  LDL 70 in May 2024.  Disposition:  Follow up  1 year.  Signed, Jonelle Sidle, M.D., F.A.C.C. [facility] at [facility]

## 2023-04-15 NOTE — Patient Instructions (Signed)
Medication Instructions:  Your physician recommends that you continue on your current medications as directed. Please refer to the Current Medication list given to you today.    Labwork: None today  Testing/Procedures: Your physician has requested that you have an echocardiogram. Echocardiography is a painless test that uses sound waves to create images of your heart. It provides your doctor with information about the size and shape of your heart and how well your heart's chambers and valves are working. This procedure takes approximately one hour. There are no restrictions for this procedure. Please do NOT wear cologne, perfume, aftershave, or lotions (deodorant is allowed). Please arrive 15 minutes prior to your appointment time.  Please note: We ask at that you not bring children with you during ultrasound (echo/ vascular) testing. Due to room size and safety concerns, children are not allowed in the ultrasound rooms during exams. Our front office staff cannot provide observation of children in our lobby area while testing is being conducted. An adult accompanying a patient to their appointment will only be allowed in the ultrasound room at the discretion of the ultrasound technician under special circumstances. We apologize for any inconvenience.   Follow-Up: 1 year with Dr.McDowell  Any Other Special Instructions Will Be Listed Below (If Applicable).  If you need a refill on your cardiac medications before your next appointment, please call your pharmacy.

## 2023-05-01 ENCOUNTER — Ambulatory Visit (HOSPITAL_COMMUNITY)
Admission: RE | Admit: 2023-05-01 | Discharge: 2023-05-01 | Disposition: A | Discharge status: Home / Self Care | Payer: PPO | Source: Ambulatory Visit | Attending: Cardiology | Admitting: Cardiology

## 2023-05-01 DIAGNOSIS — R011 Cardiac murmur, unspecified: Secondary | ICD-10-CM | POA: Insufficient documentation

## 2023-05-01 LAB — ECHOCARDIOGRAM COMPLETE
AR max vel: 1.74 cm2
AV Area VTI: 1.49 cm2
AV Area mean vel: 1.61 cm2
AV Mean grad: 11 mm[Hg]
AV Peak grad: 21.3 mm[Hg]
Ao pk vel: 2.31 m/s
Area-P 1/2: 3.07 cm2
Calc EF: 53.5 %
MV VTI: 2.63 cm2
S' Lateral: 4.3 cm
Single Plane A2C EF: 43.5 %
Single Plane A4C EF: 60.4 %

## 2023-05-12 ENCOUNTER — Ambulatory Visit: Payer: PPO | Admitting: Urology

## 2023-05-30 ENCOUNTER — Ambulatory Visit: Payer: PPO | Admitting: Urology

## 2023-05-30 VITALS — BP 129/83 | HR 76

## 2023-05-30 DIAGNOSIS — R350 Frequency of micturition: Secondary | ICD-10-CM | POA: Diagnosis not present

## 2023-05-30 DIAGNOSIS — R351 Nocturia: Secondary | ICD-10-CM | POA: Diagnosis not present

## 2023-05-30 DIAGNOSIS — N138 Other obstructive and reflux uropathy: Secondary | ICD-10-CM

## 2023-05-30 DIAGNOSIS — N401 Enlarged prostate with lower urinary tract symptoms: Secondary | ICD-10-CM

## 2023-05-30 LAB — URINALYSIS, ROUTINE W REFLEX MICROSCOPIC
Bilirubin, UA: NEGATIVE
Ketones, UA: NEGATIVE
Leukocytes,UA: NEGATIVE
Nitrite, UA: NEGATIVE
Protein,UA: NEGATIVE
Specific Gravity, UA: 1.015 (ref 1.005–1.030)
Urobilinogen, Ur: 0.2 mg/dL (ref 0.2–1.0)
pH, UA: 6.5 (ref 5.0–7.5)

## 2023-05-30 LAB — MICROSCOPIC EXAMINATION: Bacteria, UA: NONE SEEN

## 2023-05-30 LAB — BLADDER SCAN AMB NON-IMAGING: Scan Result: 314

## 2023-05-30 MED ORDER — AZITHROMYCIN 250 MG PO TABS
ORAL_TABLET | ORAL | 0 refills | Status: AC
Start: 2023-05-30 — End: ?

## 2023-05-30 MED ORDER — SILODOSIN 8 MG PO CAPS: 8.0000 mg | ORAL_CAPSULE | Freq: Every day | ORAL | 3 refills | Status: AC

## 2023-05-30 NOTE — Progress Notes (Signed)
 Bladder Scan completed today.  Patient can void prior to the bladder scan. Bladder scan result: 314  Performed By: [facility] LPN
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
## 2023-06-03 ENCOUNTER — Encounter: Payer: Self-pay | Admitting: Urology

## 2023-06-03 NOTE — Patient Instructions (Signed)

## 2023-07-21 DIAGNOSIS — E663 Overweight: Secondary | ICD-10-CM | POA: Diagnosis not present

## 2023-07-21 DIAGNOSIS — J069 Acute upper respiratory infection, unspecified: Secondary | ICD-10-CM | POA: Diagnosis not present

## 2023-07-21 DIAGNOSIS — Z6828 Body mass index (BMI) 28.0-28.9, adult: Secondary | ICD-10-CM | POA: Diagnosis not present

## 2023-07-21 DIAGNOSIS — R6889 Other general symptoms and signs: Secondary | ICD-10-CM | POA: Diagnosis not present

## 2023-07-21 DIAGNOSIS — Z20828 Contact with and (suspected) exposure to other viral communicable diseases: Secondary | ICD-10-CM | POA: Diagnosis not present

## 2023-08-01 DIAGNOSIS — Z6828 Body mass index (BMI) 28.0-28.9, adult: Secondary | ICD-10-CM | POA: Diagnosis not present

## 2023-08-01 DIAGNOSIS — Z1331 Encounter for screening for depression: Secondary | ICD-10-CM | POA: Diagnosis not present

## 2023-08-01 DIAGNOSIS — R7303 Prediabetes: Secondary | ICD-10-CM | POA: Diagnosis not present

## 2023-08-01 DIAGNOSIS — Z0001 Encounter for general adult medical examination with abnormal findings: Secondary | ICD-10-CM | POA: Diagnosis not present

## 2023-08-01 DIAGNOSIS — J189 Pneumonia, unspecified organism: Secondary | ICD-10-CM | POA: Diagnosis not present

## 2023-08-01 DIAGNOSIS — I129 Hypertensive chronic kidney disease with stage 1 through stage 4 chronic kidney disease, or unspecified chronic kidney disease: Secondary | ICD-10-CM | POA: Diagnosis not present

## 2023-08-01 DIAGNOSIS — N183 Chronic kidney disease, stage 3 unspecified: Secondary | ICD-10-CM | POA: Diagnosis not present

## 2023-08-01 DIAGNOSIS — E063 Autoimmune thyroiditis: Secondary | ICD-10-CM | POA: Diagnosis not present

## 2023-08-01 DIAGNOSIS — E782 Mixed hyperlipidemia: Secondary | ICD-10-CM | POA: Diagnosis not present

## 2023-08-01 DIAGNOSIS — E663 Overweight: Secondary | ICD-10-CM | POA: Diagnosis not present

## 2023-08-01 DIAGNOSIS — R052 Subacute cough: Secondary | ICD-10-CM | POA: Diagnosis not present

## 2023-08-01 DIAGNOSIS — N401 Enlarged prostate with lower urinary tract symptoms: Secondary | ICD-10-CM | POA: Diagnosis not present

## 2023-10-06 DIAGNOSIS — I10 Essential (primary) hypertension: Secondary | ICD-10-CM | POA: Diagnosis not present

## 2023-10-06 DIAGNOSIS — E785 Hyperlipidemia, unspecified: Secondary | ICD-10-CM | POA: Diagnosis not present

## 2023-10-06 DIAGNOSIS — E063 Autoimmune thyroiditis: Secondary | ICD-10-CM | POA: Diagnosis not present

## 2023-10-06 DIAGNOSIS — J42 Unspecified chronic bronchitis: Secondary | ICD-10-CM | POA: Diagnosis not present

## 2023-10-06 DIAGNOSIS — N400 Enlarged prostate without lower urinary tract symptoms: Secondary | ICD-10-CM | POA: Diagnosis not present

## 2023-10-06 DIAGNOSIS — R7303 Prediabetes: Secondary | ICD-10-CM | POA: Diagnosis not present

## 2023-11-10 DIAGNOSIS — X32XXXD Exposure to sunlight, subsequent encounter: Secondary | ICD-10-CM | POA: Diagnosis not present

## 2023-11-10 DIAGNOSIS — L570 Actinic keratosis: Secondary | ICD-10-CM | POA: Diagnosis not present

## 2023-11-18 ENCOUNTER — Ambulatory Visit

## 2023-12-01 DIAGNOSIS — I10 Essential (primary) hypertension: Secondary | ICD-10-CM | POA: Diagnosis not present

## 2023-12-01 DIAGNOSIS — E063 Autoimmune thyroiditis: Secondary | ICD-10-CM | POA: Diagnosis not present

## 2023-12-01 DIAGNOSIS — R7303 Prediabetes: Secondary | ICD-10-CM | POA: Diagnosis not present

## 2023-12-08 DIAGNOSIS — N400 Enlarged prostate without lower urinary tract symptoms: Secondary | ICD-10-CM | POA: Diagnosis not present

## 2023-12-08 DIAGNOSIS — J42 Unspecified chronic bronchitis: Secondary | ICD-10-CM | POA: Diagnosis not present

## 2023-12-08 DIAGNOSIS — Z87891 Personal history of nicotine dependence: Secondary | ICD-10-CM | POA: Diagnosis not present

## 2023-12-08 DIAGNOSIS — Z79890 Hormone replacement therapy (postmenopausal): Secondary | ICD-10-CM | POA: Diagnosis not present

## 2023-12-08 DIAGNOSIS — I10 Essential (primary) hypertension: Secondary | ICD-10-CM | POA: Diagnosis not present

## 2023-12-08 DIAGNOSIS — Z23 Encounter for immunization: Secondary | ICD-10-CM | POA: Diagnosis not present

## 2023-12-08 DIAGNOSIS — Z6829 Body mass index (BMI) 29.0-29.9, adult: Secondary | ICD-10-CM | POA: Diagnosis not present

## 2023-12-08 DIAGNOSIS — E063 Autoimmune thyroiditis: Secondary | ICD-10-CM | POA: Diagnosis not present

## 2023-12-08 DIAGNOSIS — E785 Hyperlipidemia, unspecified: Secondary | ICD-10-CM | POA: Diagnosis not present

## 2023-12-08 DIAGNOSIS — Z79899 Other long term (current) drug therapy: Secondary | ICD-10-CM | POA: Diagnosis not present

## 2023-12-08 DIAGNOSIS — R7303 Prediabetes: Secondary | ICD-10-CM | POA: Diagnosis not present

## 2023-12-08 DIAGNOSIS — Z713 Dietary counseling and surveillance: Secondary | ICD-10-CM | POA: Diagnosis not present

## 2023-12-29 ENCOUNTER — Ambulatory Visit: Payer: PPO | Admitting: Orthopedic Surgery

## 2023-12-29 ENCOUNTER — Other Ambulatory Visit: Payer: Self-pay

## 2023-12-29 DIAGNOSIS — Z96642 Presence of left artificial hip joint: Secondary | ICD-10-CM | POA: Diagnosis not present

## 2023-12-29 DIAGNOSIS — M1612 Unilateral primary osteoarthritis, left hip: Secondary | ICD-10-CM

## 2023-12-29 NOTE — Progress Notes (Signed)
 FOLLOW-UP OFFICE VISIT   Patient: Ryan [name]           Date of Birth: 23-Feb-1946           MRN: [id number] Visit Date: 12/29/2023 Requested by: Marvine Rush, MD [address] [place],  [place] [address] PCP: Marvine Rush, MD    Encounter Diagnoses  Name Primary?   S/P hip replacement, left    Arthritis of left hip Yes    Chief Complaint  Patient presents with   Hip Pain    Left HIP DOS     78 year old male status post left total hip arthroplasty in 2018 for arthritis  Pain relief excellent.  Patient is having some issues with his gait he has had a couple of falls since we saw him last.  Wife also notes that the patient walks very quickly.  He is having some trouble lifting his left lower extremity    Leg lengths remain equal he actually has good strength in terms of left hip flexion  DG HIP UNILAT WITH PELVIS 2-3 VIEWS LEFT Result Date: 12/29/2023 X-rays left hip Follow-up left hip total hip arthroplasty Press-fit cup press-fit stem Alignment of cup and stem are maintained with no evidence of loosening Impression stable total hip arthroplasty at 7 years     ASSESSMENT AND PLAN  Total hip arthroplasty right hip stable  Patient encouraged to slow down with his gait and be careful when he is ambulating.  Follow-up in a year repeat x-ray

## 2023-12-29 NOTE — Progress Notes (Signed)
    12/29/2023   Chief Complaint  Patient presents with   Hip Pain    Left HIP DOS     No diagnosis found.  What pharmacy do you use ? _____[facility] ______________________  DOI/DOS/ Date: 09/11/16  Improved- wife states can't pick his feet up and has had a couple falls recently

## 2024-01-07 DIAGNOSIS — H04123 Dry eye syndrome of bilateral lacrimal glands: Secondary | ICD-10-CM | POA: Diagnosis not present

## 2024-01-26 ENCOUNTER — Encounter: Payer: Self-pay | Admitting: Radiology

## 2024-04-09 ENCOUNTER — Encounter: Payer: Self-pay | Admitting: Cardiology

## 2024-04-09 ENCOUNTER — Ambulatory Visit: Attending: Cardiology | Admitting: Cardiology

## 2024-04-09 VITALS — BP 138/72 | HR 61 | Ht 68.0 in | Wt 202.6 lb

## 2024-04-09 DIAGNOSIS — I350 Nonrheumatic aortic (valve) stenosis: Secondary | ICD-10-CM | POA: Diagnosis not present

## 2024-04-09 DIAGNOSIS — R011 Cardiac murmur, unspecified: Secondary | ICD-10-CM

## 2024-04-09 DIAGNOSIS — E782 Mixed hyperlipidemia: Secondary | ICD-10-CM | POA: Diagnosis not present

## 2024-04-09 DIAGNOSIS — I5020 Unspecified systolic (congestive) heart failure: Secondary | ICD-10-CM

## 2024-04-09 DIAGNOSIS — I10 Essential (primary) hypertension: Secondary | ICD-10-CM | POA: Diagnosis not present

## 2024-04-09 NOTE — Progress Notes (Signed)
"  ° ° °  Cardiology Office Note  Date: 04/09/2024   ID: Ryan [name], DOB 1945-07-10, MRN [id number]  History of Present Illness: Ryan [name] is a 79 y.o. male last seen in January 2025.  He is here today with his wife for a routine visit.  States that he feels well, no exertional chest pain, stable NYHA class I-II dyspnea, no fluid retention.  No palpitations or syncope.  We went over his medications.  He reports compliance with current therapy.  Now following with Dr. Shona for primary care.  I reviewed his lab work from December 2025.  Blood pressure was mildly elevated today.  I have asked them to continue to check periodically at home.  Also maintaining activity, 10 pound weight loss would be effective as well.  I reviewed his ECG today which shows sinus rhythm with IVCD, R' in lead V1, left anterior fascicular block.  Physical Exam: VS:  BP 138/72 (BP Location: Left Arm)   Pulse 61   Ht 5' 8 (1.727 m)   Wt 202 lb 9.6 oz (91.9 kg)   SpO2 95%   BMI 30.81 kg/m , BMI Body mass index is 30.81 kg/m.  Wt Readings from Last 3 Encounters:  04/09/24 202 lb 9.6 oz (91.9 kg)  04/15/23 198 lb 9.6 oz (90.1 kg)  12/30/22 200 lb (90.7 kg)    General: Patient appears comfortable at rest. HEENT: Conjunctiva and lids normal. Neck: Supple, no elevated JVP or carotid bruits. Lungs: Clear to auscultation, nonlabored breathing at rest. Cardiac: Regular rate and rhythm, no S3, 2/6 systolic murmur. Extremities: No pitting edema.  ECG:  An ECG dated 04/15/2023 was personally reviewed today and demonstrated:  Sinus rhythm with left anterior fascicular block.  Labwork:  December 2025: TSH 3.31, hemoglobin 13.3, platelets 247, BUN 15, creatinine 1.36, GFR 54, potassium 5, AST 18, ALT 16, cholesterol 137, triglycerides 93, HDL 50, LDL 69, hemoglobin A1c 6%  Other Studies Reviewed Today:  No interval cardiac testing for review today.  Assessment and Plan:  1.  HFrecEF, LVEF 50 to 55% by  follow-up echocardiogram in February 2025.  He is clinically stable with NYHA class I-II dyspnea, no fluid retention.  Current GDMT includes Benicar 40 mg daily along with Norvasc  5 mg daily for concurrent treatment of primary hypertension.  He has no diuretic requirement.  Continue to observe.  Did recommend a regular exercise plan.  2.  Primary hypertension.  For now continue Norvasc  5 mg daily and Benicar 40 mg daily.  Weight loss discussed, also regular activity.  Continue to track measurements at home.  3.  Mixed hyperlipidemia.  LDL 69 and HDL 50 in December 2025.  Continue Crestor  10 mg daily.  4.  Systolic murmur, echocardiogram from February 2025 shows moderate aortic valve calcification with evidence of mild aortic stenosis, leaflet number difficult to assess.  Continue to follow.  Disposition:  Follow up 1 year.  Signed, Jayson JUDITHANN Sierras, M.D., F.A.C.C. [facility] "

## 2024-04-09 NOTE — Patient Instructions (Signed)
Medication Instructions:  Your physician recommends that you continue on your current medications as directed. Please refer to the Current Medication list given to you today.   Labwork: None today  Testing/Procedures: None today  Follow-Up: 1 year with Dr.McDowell  Any Other Special Instructions Will Be Listed Below (If Applicable).  If you need a refill on your cardiac medications before your next appointment, please call your pharmacy.

## 2024-05-31 ENCOUNTER — Ambulatory Visit: Admitting: Urology

## 2024-12-27 ENCOUNTER — Ambulatory Visit: Admitting: Orthopedic Surgery
